# Patient Record
Sex: Female | Born: 1970 | Race: White | Hispanic: No | Marital: Married | Smoking: Never smoker
Health system: Southern US, Community
[De-identification: ages and names within clinical notes are randomized; demographics above are authoritative.]

## PROBLEM LIST (undated history)

## (undated) DIAGNOSIS — M1990 Unspecified osteoarthritis, unspecified site: Secondary | ICD-10-CM

## (undated) DIAGNOSIS — I10 Essential (primary) hypertension: Secondary | ICD-10-CM

## (undated) DIAGNOSIS — K219 Gastro-esophageal reflux disease without esophagitis: Secondary | ICD-10-CM

## (undated) DIAGNOSIS — J45909 Unspecified asthma, uncomplicated: Secondary | ICD-10-CM

## (undated) DIAGNOSIS — F988 Other specified behavioral and emotional disorders with onset usually occurring in childhood and adolescence: Secondary | ICD-10-CM

## (undated) DIAGNOSIS — Z87440 Personal history of urinary (tract) infections: Secondary | ICD-10-CM

## (undated) DIAGNOSIS — F3130 Bipolar disorder, current episode depressed, mild or moderate severity, unspecified: Secondary | ICD-10-CM

## (undated) HISTORY — DX: Bipolar disorder, current episode depressed, mild or moderate severity, unspecified: F31.30

## (undated) HISTORY — DX: Other specified behavioral and emotional disorders with onset usually occurring in childhood and adolescence: F98.8

## (undated) HISTORY — PX: CHOLECYSTECTOMY: SHX55

## (undated) HISTORY — PX: CARPAL TUNNEL RELEASE: SHX101

---

## 2007-02-16 ENCOUNTER — Ambulatory Visit (HOSPITAL_COMMUNITY): Admission: RE | Admit: 2007-02-16 | Discharge: 2007-02-17 | Payer: Self-pay | Admitting: Neurosurgery

## 2007-03-12 HISTORY — PX: CERVICAL DISC SURGERY: SHX588

## 2007-08-31 ENCOUNTER — Ambulatory Visit (HOSPITAL_COMMUNITY): Admission: RE | Admit: 2007-08-31 | Discharge: 2007-08-31 | Payer: Self-pay | Admitting: Neurosurgery

## 2007-12-10 ENCOUNTER — Encounter: Admission: RE | Admit: 2007-12-10 | Discharge: 2007-12-10 | Payer: Self-pay | Admitting: Neurosurgery

## 2008-03-11 HISTORY — PX: ABDOMINAL HYSTERECTOMY: SHX81

## 2008-09-02 ENCOUNTER — Encounter: Admission: RE | Admit: 2008-09-02 | Discharge: 2008-09-02 | Payer: Self-pay | Admitting: Neurosurgery

## 2009-11-16 ENCOUNTER — Encounter: Admission: RE | Admit: 2009-11-16 | Discharge: 2009-11-16 | Payer: Self-pay | Admitting: Neurosurgery

## 2010-07-24 NOTE — Op Note (Signed)
Anna, [name] NO.:  [id number]   MEDICAL RECORD NO.:  [id number]          PATIENT TYPE:  INP   LOCATION:  3172                         FACILITY:  [facility]   PHYSICIAN:  Donalee Citrin, M.D.        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   PROCEDURE:  Anterior cervical diskectomies and fusion at C4-5, C5-6, and  C6-7 using 7-mm cornerstone allograft wedges at C4-5 and C6-7, a 6-cm at  C5-6, and a 55-mm Danek Venture plating system with eight 13-mm screws.   SURGEON:  Donalee Citrin, M.D.   ASSISTANT:  Kathaleen Maser. Pool, M.D.   ANESTHESIA:  General endotracheal.   INDICATIONS FOR PROCEDURE:  The patient is a very pleasant 40 year old  female who has had longstanding neck pain and bilateral shoulder and arm  pain radiating down through her shoulder into her thumb and forefinger  predominantly of the right hand.  She has also had weakness and numbness  in the hands, with difficulty opening jars and buttoning a shirt, and  this has been progressing.  MRI scan showed a very large disk herniation  at C4-5 causing severe spinal cord compression, narrowing the canal to  just a few millimeters in width.  In addition, at C5-6 and C6-7, there  was also large disk herniations causing spinal cord compression and  biforaminal stenosis, worse on the right.  Due to the patient's MRI  findings, physical exam, and symptomatology, the patient was recommended  anterior cervical diskectomy and fusion.  The risks and benefits of the  operation were explained to the patient who understands and agreed to  proceed.   DESCRIPTION OF PROCEDURE:  The patient was brought to the OR and induced  under general anesthesia.  The patient was placed supine, and the neck  was  extended in 5 pounds of Halter traction.  The right side of the neck  was prepped and draped in the usual fashion.   Preoperative x-ray localized the appropriate level.  A curvilinear  incision was made just off the midline through the anterior portion of  the sternocleidomastoid  __________  The platysma was dissected out and  divided longitudinal.  The avascular plane between the  sternocleidomastoid and strap muscles was developed down to the  prevertebral fascia.  The prevertebral fascia was then dissected with  Kittner's.  Intraoperative x-ray confirmed localization and appropriate  level at C4-5.  Annulotomy was made with a 15 blade scalpel, and then  the long __________  laterally at all three levels.  At this point, the  __________ was placed.  Annulotomies were extended at all three levels,  and large anterior osteophytes were bitten off at C4, C5, and C6  vertebral bodies at all three levels with a 2 and 3-mm Kerrison punch.  Then using high-speed drill, all three disk spaces were drilled down to  the  posterior annulus and posterior osteophytic complexes, and at this  point, __________ first at C4-5.  The disk space was again drilled down  to the posterior annulus and using a 1-mm Kerrison punch, the posterior  annulus was removed in piecemeal fashion, exposing the posterior  longitudinal ligament.  There was noted to be marked disruption of the  PLL with large disk herniations that had migrated __________ compressing  the thecal sac.  These were teased away with a blunt nerve hook and  removed in piecemeal fashion and then aggressive underbiting of both the  C4 and the C5 endplates were achieved, decompressing the central canal.  Again, several large fragments of disk were removed compressing the  spinal cord, and located behind the C4 vertebral bodies __________  with  a blunt nerve hook and the endplate was further underbitten until  adequate decompression centrally was  achieved.  Both C5 pedicles were  palpated, and both C5 neural foramina were opened up.  After this,  Gelfoam was placed at this level, and attention was taken to then to C6-  7.  In a similar fashion, C6-7 was drilled down.  The posterior annulus  and the posterior longitudinal ligament was removed in piecemeal  fashion.  Again, further disk disruption and osteophytes __________  cause spinal cord compression predominantly to the right side.  This was  all underbitten and decompressed.  Both the C7 nerve root and C7  pedicles were palpated, and the endplate of C6 and C7 were aggressively  underbitten, decompressing the central canal, and both foramina were  widely patent at the end of the diskectomy.  Then lastly at C5-6 in a  similar fashion, large disk disruption was appreciated and causing  severe stenosis on the C6 nerve root on the right.  This was all removed  in piecemeal fashion.  The C6 nerve was skeletonized, and the foramen  was widely opened up.  Both endplates were aggressively underbitten,  decompressing the central canal, and the left C6 nerve root was also  decompressed.  At the end of the diskectomies, the endplates were  scraped.  A size 7 grafts were inserted at C4-5 and C6-7 and a 6-mm at  C5-6.  With all grafts in place, one 10-mm __________  .  Meticulous  hemostasis was maintained with Gelfoam and Surgifoam, and the wound was  copiously irrigated.  Then a Venture plate was placed.  All screws were  drilled and had excellent purchase.  The locking mechanism was engaged.  Fluoroscopy confirmed good position of the bone grafts, plates, and  screws.  Then again after adequate hemostasis had been maintained, the  wounds were closed in layers with interrupted Vicryl in the platysma and  a running 4-0 subcuticular.  Benzoin and Steri-Strips were applied.   The patient went to the recovery room in stable condition.  At the end  of the case, needle and sponge counts were  correct.           ______________________________  Donalee Citrin, M.D.     GC/MEDQ  D:  02/16/2007  T:  02/16/2007  Job:  [id number]

## 2010-07-24 NOTE — Op Note (Signed)
Anna [name], BELKNAP                 ACCOUNT NO.:  [id number]   MEDICAL RECORD NO.:  [id number]          PATIENT TYPE:  AMB   LOCATION:  SDS                          FACILITY:  [facility]   PHYSICIAN:  Donalee Citrin, M.D.        DATE OF BIRTH:  [birth year]   DATE OF PROCEDURE:  DATE OF DISCHARGE:                               OPERATIVE REPORT   PREOPERATIVE DIAGNOSIS:  Right carpal tunnel syndrome.   PROCEDURE:  Right carpal tunnel release.   HISTORY OF PRESENT ILLNESS:  The patient is a pleasant 40 year old  female who has had progressive worsening hand and wrist and hand pain  and numbness consistent with carpal tunnel syndrome for the last several  weeks and months.  EMG showed severe with median nerve entrapment of the  wrist.  Due to EMG findings and clinical exam consistent with carpal  tunnel syndrome, the patient is recommended right carpal tunnel release.  Risks and benefits of the operation were explained to the patient, and  he understands and agreed to proceed forward.   PROCEDURE IN DETAIL:  The patient was brought to the OR and was induced  under Bier block anesthesia on the right upper extremity plus  infiltration of 3 mL lidocaine with local adequately anesthetized the  right wrist.  An approximately 3 to 4-cm incision was drawn from the  distal crease of the wrist along the palmar crease towards the middle  finger and incised with  a 15-blade scalpel then a self-retaining  retractor was placed.  The transcarpal ligament was identified and  divided very carefully in layers until the median nerve epineurium was  visualized and using hemostat, the plane from undersurface of the carpal  ligament and the median nerve was developed.  The ligament was then  remarkably hypertrophied and this was incised with a 15-blade scalpel  using the hemostats to maintain plane between the ligament and the  nerve.  After adequate decompression and division of the transcarpal  ligament was  achieved both proximally distally and the hemostat easily  passed along the track of median the nerve, the wound was irrigated and  closed with interrupted vertical mattress nylon in the skin.  Wound was  then dressed and arm was wrapped.  The patient was sent to recovery room  in stable addition.  At the end of case, all sponge, needle, and  instrument counts were correct.           ______________________________  Donalee Citrin, M.D.     GC/MEDQ  D:  08/31/2007  T:  08/31/2007  Job:  [id number]

## 2010-12-06 LAB — CBC
HCT: 38.1
MCV: 90.6
Platelets: 327
RBC: 4.21
WBC: 8.5

## 2010-12-06 LAB — BASIC METABOLIC PANEL
CO2: 24
Calcium: 9.7
Creatinine, Ser: 0.73

## 2010-12-17 LAB — BASIC METABOLIC PANEL
BUN: 13
Chloride: 105
Creatinine, Ser: 1.01
Glucose, Bld: 103 — ABNORMAL HIGH
Potassium: 4
Sodium: 139

## 2010-12-17 LAB — CBC
HCT: 37.2
Platelets: 340
WBC: 8.7

## 2012-03-11 DIAGNOSIS — Z87440 Personal history of urinary (tract) infections: Secondary | ICD-10-CM

## 2012-03-11 HISTORY — DX: Personal history of urinary (tract) infections: Z87.440

## 2012-05-19 ENCOUNTER — Other Ambulatory Visit: Payer: Self-pay | Admitting: Neurosurgery

## 2012-07-06 ENCOUNTER — Encounter (HOSPITAL_COMMUNITY)
Admission: RE | Admit: 2012-07-06 | Discharge: 2012-07-06 | Disposition: A | Discharge status: Home / Self Care | Payer: Self-pay | Source: Ambulatory Visit | Attending: Neurosurgery | Admitting: Neurosurgery

## 2012-07-06 NOTE — Pre-Procedure Instructions (Signed)
Anna [name]  07/06/2012   Your procedure is scheduled on:  07/13/12  Report to [facility] at530 AM.  Call this number if you have problems the morning of surgery: [phone]   Remember:   Do not eat food or drink liquids after midnight.   Take these medicines the morning of surgery with A SIP OF WATER:   Do not wear jewelry, make-up or nail polish.  Do not wear lotions, powders, or perfumes. You may wear deodorant.  Do not shave 48 hours prior to surgery. Men may shave face and neck.  Do not bring valuables to the hospital.  Contacts, dentures or bridgework may not be worn into surgery.  Leave suitcase in the car. After surgery it may be brought to your room.  For patients admitted to the hospital, checkout time is 11:00 AM the day of  discharge.   Patients discharged the day of surgery will not be allowed to drive  home.  Name and phone number of your driver:   Special Instructions: Shower using CHG 2 nights before surgery and the night before surgery.  If you shower the day of surgery use CHG.  Use special wash - you have one bottle of CHG for all showers.  You should use approximately 1/3 of the bottle for each shower.   Please read over the following fact sheets that you were given: Pain Booklet, Coughing and Deep Breathing, MRSA Information and Surgical Site Infection Prevention

## 2012-07-07 ENCOUNTER — Encounter (HOSPITAL_COMMUNITY): Payer: Self-pay | Admitting: Pharmacy Technician

## 2012-07-08 ENCOUNTER — Inpatient Hospital Stay (HOSPITAL_COMMUNITY): Admission: RE | Admit: 2012-07-08 | Payer: Self-pay | Source: Ambulatory Visit

## 2012-07-10 ENCOUNTER — Encounter (HOSPITAL_COMMUNITY): Payer: Self-pay

## 2012-07-10 ENCOUNTER — Encounter (HOSPITAL_COMMUNITY)
Admission: RE | Admit: 2012-07-10 | Discharge: 2012-07-10 | Disposition: A | Discharge status: Home / Self Care | Payer: BC Managed Care – PPO | Source: Ambulatory Visit | Attending: Neurosurgery | Admitting: Neurosurgery

## 2012-07-10 HISTORY — DX: Unspecified asthma, uncomplicated: J45.909

## 2012-07-10 HISTORY — DX: Personal history of urinary (tract) infections: Z87.440

## 2012-07-10 HISTORY — DX: Unspecified osteoarthritis, unspecified site: M19.90

## 2012-07-10 HISTORY — DX: Gastro-esophageal reflux disease without esophagitis: K21.9

## 2012-07-10 HISTORY — DX: Essential (primary) hypertension: I10

## 2012-07-10 LAB — CBC
MCH: 30 pg (ref 26.0–34.0)
MCV: 84 fL (ref 78.0–100.0)
Platelets: 260 10*3/uL (ref 150–400)
RDW: 12.4 % (ref 11.5–15.5)
WBC: 10 10*3/uL (ref 4.0–10.5)

## 2012-07-10 LAB — SURGICAL PCR SCREEN: Staphylococcus aureus: NEGATIVE

## 2012-07-10 LAB — BASIC METABOLIC PANEL
CO2: 32 mEq/L (ref 19–32)
Calcium: 9.1 mg/dL (ref 8.4–10.5)
Creatinine, Ser: 1.02 mg/dL (ref 0.50–1.10)

## 2012-07-10 NOTE — Progress Notes (Signed)
ekg's reviewed by dr Chaney Malling. No orders

## 2012-07-10 NOTE — Pre-Procedure Instructions (Addendum)
Anna [name]  07/10/2012   Your procedure is scheduled on:  07/13/12  Report to [facility] at530 AM.  Call this number if you have problems the morning of surgery: [phone]   Remember:   Do not eat food or drink liquids after midnight.   Take these medicines the morning of surgery with A SIP OF WATER:xanax,  cymbalta,diflucan, pain med, valcyclovir   Do not wear jewelry, make-up or nail polish.  Do not wear lotions, powders, or perfumes. You may wear deodorant.  Do not shave 48 hours prior to surgery. Men may shave face and neck.  Do not bring valuables to the hospital.  Contacts, dentures or bridgework may not be worn into surgery.  Leave suitcase in the car. After surgery it may be brought to your room.  For patients admitted to the hospital, checkout time is 11:00 AM the day of  discharge.   Patients discharged the day of surgery will not be allowed to drive  home.  Name and phone number of your driver:   Special Instructions: Shower using CHG 2 nights before surgery and the night before surgery.  If you shower the day of surgery use CHG.  Use special wash - you have one bottle of CHG for all showers.  You should use approximately 1/3 of the bottle for each shower.   Please read over the following fact sheets that you were given: Pain Booklet, Coughing and Deep Breathing, MRSA Information and Surgical Site Infection Prevention

## 2012-07-10 NOTE — Progress Notes (Signed)
Please have anesthesia review labs; potassium is at a critical level.

## 2012-07-12 MED ORDER — DEXAMETHASONE SODIUM PHOSPHATE 10 MG/ML IJ SOLN
10.0000 mg | INTRAMUSCULAR | Status: AC
  Administered 2012-07-13: 10 mg via INTRAVENOUS
  Filled 2012-07-12: qty 1

## 2012-07-12 MED ORDER — CEFAZOLIN SODIUM-DEXTROSE 2-3 GM-% IV SOLR
2.0000 g | INTRAVENOUS | Status: AC
  Administered 2012-07-13: 2 g via INTRAVENOUS

## 2012-07-13 ENCOUNTER — Ambulatory Visit (HOSPITAL_COMMUNITY): Payer: BC Managed Care – PPO

## 2012-07-13 ENCOUNTER — Encounter (HOSPITAL_COMMUNITY)
Admission: RE | Disposition: A | Discharge status: Home / Self Care | Payer: Self-pay | Source: Ambulatory Visit | Attending: Neurosurgery

## 2012-07-13 ENCOUNTER — Ambulatory Visit (HOSPITAL_COMMUNITY): Payer: BC Managed Care – PPO | Admitting: Anesthesiology

## 2012-07-13 ENCOUNTER — Ambulatory Visit (HOSPITAL_COMMUNITY)
Admission: RE | Admit: 2012-07-13 | Discharge: 2012-07-13 | Disposition: A | Discharge status: Home / Self Care | Payer: BC Managed Care – PPO | Source: Ambulatory Visit | Attending: Neurosurgery | Admitting: Neurosurgery

## 2012-07-13 ENCOUNTER — Encounter (HOSPITAL_COMMUNITY): Payer: Self-pay | Admitting: Anesthesiology

## 2012-07-13 DIAGNOSIS — Z01810 Encounter for preprocedural cardiovascular examination: Secondary | ICD-10-CM | POA: Insufficient documentation

## 2012-07-13 DIAGNOSIS — I10 Essential (primary) hypertension: Secondary | ICD-10-CM | POA: Insufficient documentation

## 2012-07-13 DIAGNOSIS — Z981 Arthrodesis status: Secondary | ICD-10-CM | POA: Insufficient documentation

## 2012-07-13 DIAGNOSIS — M47812 Spondylosis without myelopathy or radiculopathy, cervical region: Secondary | ICD-10-CM | POA: Insufficient documentation

## 2012-07-13 DIAGNOSIS — Z01812 Encounter for preprocedural laboratory examination: Secondary | ICD-10-CM | POA: Insufficient documentation

## 2012-07-13 HISTORY — PX: ANTERIOR CERVICAL DECOMP/DISCECTOMY FUSION: SHX1161

## 2012-07-13 SURGERY — ANTERIOR CERVICAL DECOMPRESSION/DISCECTOMY FUSION 1 LEVEL
Anesthesia: General | Wound class: Clean

## 2012-07-13 MED ORDER — ACETAMINOPHEN 650 MG RE SUPP: 650.0000 mg | RECTAL | Status: DC | PRN

## 2012-07-13 MED ORDER — PHENOL 1.4 % MT LIQD: 1.0000 | OROMUCOSAL | Status: DC | PRN

## 2012-07-13 MED ORDER — LACTATED RINGERS IV SOLN
INTRAVENOUS | Status: DC | PRN
  Administered 2012-07-13 (×2): via INTRAVENOUS

## 2012-07-13 MED ORDER — HEMOSTATIC AGENTS (NO CHARGE) OPTIME
TOPICAL | Status: DC | PRN
  Administered 2012-07-13: 1 via TOPICAL

## 2012-07-13 MED ORDER — NEOSTIGMINE METHYLSULFATE 1 MG/ML IJ SOLN
INTRAMUSCULAR | Status: DC | PRN
  Administered 2012-07-13: 4 mg via INTRAVENOUS

## 2012-07-13 MED ORDER — ADULT MULTIVITAMIN W/MINERALS CH
1.0000 | ORAL_TABLET | Freq: Every day | ORAL | Status: DC
Start: 2012-07-13 — End: 2012-07-13
  Administered 2012-07-13: 1 via ORAL
  Filled 2012-07-13: qty 1

## 2012-07-13 MED ORDER — 0.9 % SODIUM CHLORIDE (POUR BTL) OPTIME
TOPICAL | Status: DC | PRN
  Administered 2012-07-13: 1000 mL

## 2012-07-13 MED ORDER — PHENYLEPHRINE HCL 10 MG/ML IJ SOLN
10.0000 mg | INTRAVENOUS | Status: DC | PRN
  Administered 2012-07-13: 20 ug/min via INTRAVENOUS

## 2012-07-13 MED ORDER — OXYCODONE-ACETAMINOPHEN 5-325 MG PO TABS
1.0000 | ORAL_TABLET | ORAL | Status: DC | PRN
  Administered 2012-07-13: 2 via ORAL
  Filled 2012-07-13: qty 2

## 2012-07-13 MED ORDER — LIDOCAINE HCL 4 % MT SOLN
OROMUCOSAL | Status: DC | PRN
  Administered 2012-07-13: 4 mL via TOPICAL

## 2012-07-13 MED ORDER — OLMESARTAN-AMLODIPINE-HCTZ 20-5-12.5 MG PO TABS: 1.0000 | ORAL_TABLET | Freq: Every day | ORAL | Status: DC

## 2012-07-13 MED ORDER — ACETAMINOPHEN 325 MG PO TABS: 650.0000 mg | ORAL_TABLET | ORAL | Status: DC | PRN

## 2012-07-13 MED ORDER — PHENYLEPHRINE HCL 10 MG/ML IJ SOLN
INTRAMUSCULAR | Status: DC | PRN
  Administered 2012-07-13 (×2): 120 ug via INTRAVENOUS

## 2012-07-13 MED ORDER — SODIUM CHLORIDE 0.9 % IV SOLN: 250.0000 mL | INTRAVENOUS | Status: DC

## 2012-07-13 MED ORDER — CYCLOBENZAPRINE HCL 10 MG PO TABS: 10.0000 mg | ORAL_TABLET | Freq: Three times a day (TID) | ORAL | Status: DC | PRN

## 2012-07-13 MED ORDER — MENTHOL 3 MG MT LOZG: 1.0000 | LOZENGE | OROMUCOSAL | Status: DC | PRN

## 2012-07-13 MED ORDER — HYDROMORPHONE HCL PF 1 MG/ML IJ SOLN: 0.2500 mg | INTRAMUSCULAR | Status: DC | PRN

## 2012-07-13 MED ORDER — MIDAZOLAM HCL 5 MG/5ML IJ SOLN
INTRAMUSCULAR | Status: DC | PRN
  Administered 2012-07-13 (×2): 1 mg via INTRAVENOUS

## 2012-07-13 MED ORDER — GLYCOPYRROLATE 0.2 MG/ML IJ SOLN
INTRAMUSCULAR | Status: DC | PRN
  Administered 2012-07-13: 0.6 mg via INTRAVENOUS

## 2012-07-13 MED ORDER — DULOXETINE HCL 60 MG PO CPEP
60.0000 mg | ORAL_CAPSULE | Freq: Two times a day (BID) | ORAL | Status: DC
  Administered 2012-07-13: 60 mg via ORAL
  Filled 2012-07-13 (×2): qty 1

## 2012-07-13 MED ORDER — THROMBIN 5000 UNITS EX SOLR
OROMUCOSAL | Status: DC | PRN
  Administered 2012-07-13: 09:00:00 via TOPICAL

## 2012-07-13 MED ORDER — IRBESARTAN 150 MG PO TABS: 150.0000 mg | ORAL_TABLET | Freq: Every day | ORAL | Status: DC

## 2012-07-13 MED ORDER — OXYCODONE HCL 5 MG PO TABS: 5.0000 mg | ORAL_TABLET | Freq: Once | ORAL | Status: DC | PRN

## 2012-07-13 MED ORDER — THROMBIN 5000 UNITS EX SOLR
CUTANEOUS | Status: DC | PRN
  Administered 2012-07-13 (×2): 5000 [IU] via TOPICAL

## 2012-07-13 MED ORDER — HYDROCHLOROTHIAZIDE 12.5 MG PO CAPS: 12.5000 mg | ORAL_CAPSULE | Freq: Every day | ORAL | Status: DC

## 2012-07-13 MED ORDER — SODIUM CHLORIDE 0.9 % IV SOLN
INTRAVENOUS | Status: AC
  Filled 2012-07-13: qty 500

## 2012-07-13 MED ORDER — AMLODIPINE BESYLATE 5 MG PO TABS: 5.0000 mg | ORAL_TABLET | Freq: Every day | ORAL | Status: DC

## 2012-07-13 MED ORDER — PROMETHAZINE HCL 25 MG PO TABS: 25.0000 mg | ORAL_TABLET | Freq: Three times a day (TID) | ORAL | Status: DC | PRN

## 2012-07-13 MED ORDER — AZELASTINE HCL 0.1 % NA SOLN
1.0000 | Freq: Two times a day (BID) | NASAL | Status: DC | PRN
  Filled 2012-07-13: qty 30

## 2012-07-13 MED ORDER — OXYCODONE HCL 5 MG/5ML PO SOLN: 5.0000 mg | Freq: Once | ORAL | Status: DC | PRN

## 2012-07-13 MED ORDER — ALPRAZOLAM 0.5 MG PO TABS: 0.5000 mg | ORAL_TABLET | Freq: Three times a day (TID) | ORAL | Status: DC | PRN

## 2012-07-13 MED ORDER — OXYCODONE HCL 5 MG PO TABS: ORAL_TABLET | ORAL | Status: DC

## 2012-07-13 MED ORDER — ACETAMINOPHEN 10 MG/ML IV SOLN
INTRAVENOUS | Status: AC
  Filled 2012-07-13: qty 100

## 2012-07-13 MED ORDER — LIDOCAINE HCL (CARDIAC) 20 MG/ML IV SOLN
INTRAVENOUS | Status: DC | PRN
  Administered 2012-07-13: 100 mg via INTRAVENOUS

## 2012-07-13 MED ORDER — MEPERIDINE HCL 25 MG/ML IJ SOLN: 6.2500 mg | INTRAMUSCULAR | Status: DC | PRN

## 2012-07-13 MED ORDER — SODIUM CHLORIDE 0.9 % IR SOLN
Status: DC | PRN
  Administered 2012-07-13: 08:00:00

## 2012-07-13 MED ORDER — FLUCONAZOLE 100 MG PO TABS: 100.0000 mg | ORAL_TABLET | Freq: Once | ORAL | Status: DC

## 2012-07-13 MED ORDER — ARTIFICIAL TEARS OP OINT
TOPICAL_OINTMENT | OPHTHALMIC | Status: DC | PRN
  Administered 2012-07-13: 1 via OPHTHALMIC

## 2012-07-13 MED ORDER — ONDANSETRON HCL 4 MG PO TABS: 4.0000 mg | ORAL_TABLET | Freq: Three times a day (TID) | ORAL | Status: DC | PRN

## 2012-07-13 MED ORDER — VALACYCLOVIR HCL 500 MG PO TABS
500.0000 mg | ORAL_TABLET | Freq: Two times a day (BID) | ORAL | Status: DC | PRN
Start: 2012-07-13 — End: 2012-07-13
  Filled 2012-07-13: qty 1

## 2012-07-13 MED ORDER — DOCUSATE SODIUM 100 MG PO CAPS: 100.0000 mg | ORAL_CAPSULE | Freq: Two times a day (BID) | ORAL | Status: DC

## 2012-07-13 MED ORDER — OXYCODONE HCL 10 MG PO TB12: 10.0000 mg | ORAL_TABLET | Freq: Two times a day (BID) | ORAL | Status: DC

## 2012-07-13 MED ORDER — PROPOFOL 10 MG/ML IV BOLUS
INTRAVENOUS | Status: DC | PRN
  Administered 2012-07-13: 120 mg via INTRAVENOUS

## 2012-07-13 MED ORDER — CEFAZOLIN SODIUM 1-5 GM-% IV SOLN
1.0000 g | Freq: Three times a day (TID) | INTRAVENOUS | Status: DC
  Administered 2012-07-13: 1 g via INTRAVENOUS
  Filled 2012-07-13 (×2): qty 50

## 2012-07-13 MED ORDER — BACITRACIN 50000 UNITS IM SOLR
INTRAMUSCULAR | Status: AC
  Filled 2012-07-13: qty 1

## 2012-07-13 MED ORDER — ONDANSETRON HCL 4 MG/2ML IJ SOLN: 4.0000 mg | Freq: Once | INTRAMUSCULAR | Status: DC | PRN

## 2012-07-13 MED ORDER — OXYCODONE HCL ER 10 MG PO T12A: 10.0000 mg | EXTENDED_RELEASE_TABLET | Freq: Two times a day (BID) | ORAL | Status: DC

## 2012-07-13 MED ORDER — ONDANSETRON HCL 4 MG/2ML IJ SOLN
INTRAMUSCULAR | Status: DC | PRN
  Administered 2012-07-13: 4 mg via INTRAVENOUS

## 2012-07-13 MED ORDER — ROCURONIUM BROMIDE 100 MG/10ML IV SOLN
INTRAVENOUS | Status: DC | PRN
  Administered 2012-07-13: 50 mg via INTRAVENOUS

## 2012-07-13 MED ORDER — ACETAMINOPHEN 10 MG/ML IV SOLN
1000.0000 mg | Freq: Once | INTRAVENOUS | Status: AC
  Administered 2012-07-13: 1000 mg via INTRAVENOUS

## 2012-07-13 MED ORDER — SODIUM CHLORIDE 0.9 % IJ SOLN: 3.0000 mL | INTRAMUSCULAR | Status: DC | PRN

## 2012-07-13 MED ORDER — ONDANSETRON HCL 4 MG/2ML IJ SOLN: 4.0000 mg | INTRAMUSCULAR | Status: DC | PRN

## 2012-07-13 MED ORDER — HYDROMORPHONE HCL PF 1 MG/ML IJ SOLN: 0.5000 mg | INTRAMUSCULAR | Status: DC | PRN

## 2012-07-13 MED ORDER — SODIUM CHLORIDE 0.9 % IJ SOLN: 3.0000 mL | Freq: Two times a day (BID) | INTRAMUSCULAR | Status: DC

## 2012-07-13 MED ORDER — FENTANYL CITRATE 0.05 MG/ML IJ SOLN
INTRAMUSCULAR | Status: DC | PRN
  Administered 2012-07-13: 100 ug via INTRAVENOUS
  Administered 2012-07-13: 150 ug via INTRAVENOUS
  Administered 2012-07-13: 100 ug via INTRAVENOUS
  Administered 2012-07-13: 50 ug via INTRAVENOUS

## 2012-07-13 SURGICAL SUPPLY — 58 items
16 MM DRILL BIT ×2 IMPLANT
BAG DECANTER FOR FLEXI CONT (MISCELLANEOUS) ×2 IMPLANT
BENZOIN TINCTURE PRP APPL 2/3 (GAUZE/BANDAGES/DRESSINGS) ×2 IMPLANT
BRUSH SCRUB EZ PLAIN DRY (MISCELLANEOUS) ×2 IMPLANT
BUR MATCHSTICK NEURO 3.0 LAGG (BURR) ×2 IMPLANT
CANISTER SUCTION 2500CC (MISCELLANEOUS) ×2 IMPLANT
CLOTH BEACON ORANGE TIMEOUT ST (SAFETY) ×2 IMPLANT
CONT SPEC 4OZ CLIKSEAL STRL BL (MISCELLANEOUS) ×2 IMPLANT
DERMABOND ADVANCED (GAUZE/BANDAGES/DRESSINGS) ×2
DERMABOND ADVANCED .7 DNX12 (GAUZE/BANDAGES/DRESSINGS) ×2 IMPLANT
DRAPE C-ARM 42X72 X-RAY (DRAPES) ×4 IMPLANT
DRAPE LAPAROTOMY 100X72 PEDS (DRAPES) ×2 IMPLANT
DRAPE MICROSCOPE LEICA (MISCELLANEOUS) ×2 IMPLANT
DRAPE MICROSCOPE ZEISS OPMI (DRAPES) IMPLANT
DRAPE POUCH INSTRU U-SHP 10X18 (DRAPES) ×2 IMPLANT
DRESSING TELFA 8X3 (GAUZE/BANDAGES/DRESSINGS) ×2 IMPLANT
DRILL BIT STRAIGHT 16MM (BIT) ×2 IMPLANT
DRSG OPSITE 4X5.5 SM (GAUZE/BANDAGES/DRESSINGS) ×2 IMPLANT
DURAPREP 6ML APPLICATOR 50/CS (WOUND CARE) ×2 IMPLANT
ELECT COATED BLADE 2.86 ST (ELECTRODE) ×2 IMPLANT
ELECT REM PT RETURN 9FT ADLT (ELECTROSURGICAL) ×2
ELECTRODE REM PT RTRN 9FT ADLT (ELECTROSURGICAL) ×1 IMPLANT
GAUZE SPONGE 4X4 16PLY XRAY LF (GAUZE/BANDAGES/DRESSINGS) IMPLANT
GLOVE BIO SURGEON STRL SZ8 (GLOVE) ×4 IMPLANT
GLOVE ECLIPSE 8.5 STRL (GLOVE) ×2 IMPLANT
GLOVE EXAM NITRILE LRG STRL (GLOVE) IMPLANT
GLOVE EXAM NITRILE MD LF STRL (GLOVE) ×2 IMPLANT
GLOVE EXAM NITRILE XL STR (GLOVE) IMPLANT
GLOVE EXAM NITRILE XS STR PU (GLOVE) IMPLANT
GLOVE INDICATOR 8.5 STRL (GLOVE) ×4 IMPLANT
GOWN BRE IMP SLV AUR LG STRL (GOWN DISPOSABLE) IMPLANT
GOWN BRE IMP SLV AUR XL STRL (GOWN DISPOSABLE) ×8 IMPLANT
GOWN STRL REIN 2XL LVL4 (GOWN DISPOSABLE) ×2 IMPLANT
HEAD HALTER (SOFTGOODS) ×2 IMPLANT
HEMOSTAT POWDER KIT SURGIFOAM (HEMOSTASIS) ×2 IMPLANT
KIT BASIN OR (CUSTOM PROCEDURE TRAY) ×2 IMPLANT
KIT ROOM TURNOVER OR (KITS) ×2 IMPLANT
NEEDLE HYPO 18GX1.5 BLUNT FILL (NEEDLE) ×2 IMPLANT
NEEDLE SPNL 20GX3.5 QUINCKE YW (NEEDLE) ×2 IMPLANT
NS IRRIG 1000ML POUR BTL (IV SOLUTION) ×2 IMPLANT
PACK LAMINECTOMY NEURO (CUSTOM PROCEDURE TRAY) ×2 IMPLANT
PAD ARMBOARD 7.5X6 YLW CONV (MISCELLANEOUS) ×4 IMPLANT
PUTTY BONE DBX 2.5 MIS (Bone Implant) ×2 IMPLANT
RUBBERBAND STERILE (MISCELLANEOUS) ×4 IMPLANT
SCREW BONE SELFTAP VAR 16MM (Screw) ×4 IMPLANT
SPACER COALITION 12X14-0 7MM (Spacer) ×2 IMPLANT
SPONGE GAUZE 4X4 12PLY (GAUZE/BANDAGES/DRESSINGS) ×2 IMPLANT
SPONGE INTESTINAL PEANUT (DISPOSABLE) ×2 IMPLANT
SPONGE SURGIFOAM ABS GEL SZ50 (HEMOSTASIS) ×2 IMPLANT
STRIP CLOSURE SKIN 1/2X4 (GAUZE/BANDAGES/DRESSINGS) ×2 IMPLANT
SUT VIC AB 3-0 SH 8-18 (SUTURE) ×2 IMPLANT
SUT VICRYL 4-0 PS2 18IN ABS (SUTURE) ×2 IMPLANT
SYR 20ML ECCENTRIC (SYRINGE) ×2 IMPLANT
TAPE CLOTH 4X10 WHT NS (GAUZE/BANDAGES/DRESSINGS) IMPLANT
TOWEL OR 17X24 6PK STRL BLUE (TOWEL DISPOSABLE) ×2 IMPLANT
TOWEL OR 17X26 10 PK STRL BLUE (TOWEL DISPOSABLE) ×2 IMPLANT
TRAP SPECIMEN MUCOUS 40CC (MISCELLANEOUS) ×2 IMPLANT
WATER STERILE IRR 1000ML POUR (IV SOLUTION) ×2 IMPLANT

## 2012-07-13 NOTE — Anesthesia Postprocedure Evaluation (Signed)
Anesthesia Post Note  Patient: Anna [name]  Procedure(s) Performed: Procedure(s) (LRB): ANTERIOR CERVICAL DECOMPRESSION/DISCECTOMY FUSION 1 LEVEL (N/A)  Anesthesia type: general  Patient location: PACU  Post pain: Pain level controlled  Post assessment: Patient's Cardiovascular Status Stable  Last Vitals:  Filed Vitals:   07/13/12 0922  BP:   Pulse:   Temp: 36.6 C  Resp:     Post vital signs: Reviewed and stable  Level of consciousness: sedated  Complications: No apparent anesthesia complications

## 2012-07-13 NOTE — Anesthesia Preprocedure Evaluation (Signed)
Anesthesia Evaluation  Patient identified by MRN, date of birth, ID band Patient awake    Reviewed: Allergy & Precautions, H&P , NPO status , Patient's Chart, lab work & pertinent test results  Airway Mallampati: I TM Distance: >3 FB Neck ROM: Full    Dental   Pulmonary          Cardiovascular hypertension, Pt. on medications     Neuro/Psych    GI/Hepatic GERD-  Medicated and Controlled,  Endo/Other    Renal/GU      Musculoskeletal   Abdominal   Peds  Hematology   Anesthesia Other Findings   Reproductive/Obstetrics                           Anesthesia Physical Anesthesia Plan  ASA: II  Anesthesia Plan: General   Post-op Pain Management:    Induction: Intravenous  Airway Management Planned: Oral ETT  Additional Equipment:   Intra-op Plan:   Post-operative Plan: Extubation in OR  Informed Consent: I have reviewed the patients History and Physical, chart, labs and discussed the procedure including the risks, benefits and alternatives for the proposed anesthesia with the patient or authorized representative who has indicated his/her understanding and acceptance.     Plan Discussed with: CRNA and Surgeon  Anesthesia Plan Comments:         Anesthesia Quick Evaluation  

## 2012-07-13 NOTE — Anesthesia Procedure Notes (Signed)
Procedure Name: Intubation Date/Time: 07/13/2012 7:34 AM Performed by: Gayla Medicus Pre-anesthesia Checklist: Timeout performed, Patient identified, Emergency Drugs available, Suction available and Patient being monitored Patient Re-evaluated:Patient Re-evaluated prior to inductionOxygen Delivery Method: Circle system utilized Preoxygenation: Pre-oxygenation with 100% oxygen Intubation Type: IV induction Ventilation: Mask ventilation without difficulty Laryngoscope Size: Mac and 3 Grade View: Grade I Tube type: Oral Tube size: 7.0 mm Number of attempts: 1 Airway Equipment and Method: Stylet and LTA kit utilized Placement Confirmation: positive ETCO2,  ETT inserted through vocal cords under direct vision and breath sounds checked- equal and bilateral Secured at: 21 cm Tube secured with: Tape Dental Injury: Teeth and Oropharynx as per pre-operative assessment

## 2012-07-13 NOTE — Transfer of Care (Signed)
Immediate Anesthesia Transfer of Care Note  Patient: Anna [name]  Procedure(s) Performed: Procedure(s) with comments: ANTERIOR CERVICAL DECOMPRESSION/DISCECTOMY FUSION 1 LEVEL (N/A) - Anterior Cervical Decompression/Discectomy Cervical Three-Four  Patient Location: PACU  Anesthesia Type:General  Level of Consciousness: awake, alert  and oriented  Airway & Oxygen Therapy: Patient Spontanous Breathing and Patient connected to nasal cannula oxygen  Post-op Assessment: Report given to PACU RN, Post -op Vital signs reviewed and stable and Patient moving all extremities X 4  Post vital signs: Reviewed and stable  Complications: No apparent anesthesia complications

## 2012-07-13 NOTE — Preoperative (Signed)
Beta Blockers   Reason not to administer Beta Blockers:Not Applicable 

## 2012-07-13 NOTE — H&P (Signed)
Anna [name] is an 42 y.o. female.   Chief Complaint: Neck and bilateral shoulder pain HPI: Patient is a very pleasant 42 year old same as her previous C4-C7 anterior cervical discectomies and fusion. She did very well however last several months is a progress worsening neck pain with into both shoulders usually correlates with where she wears her laptop but the pain does begin neck rate and the tops of both shoulders workup with myelopathy CT scans MRI scans and plain films have shown progressive degeneration around C3-4 of of the previous C4-C7 fusion with central and slightly rightward spur moderate spinal stenosis. In addition it also showed her previous fusion was solid at C4-5 C5-6 and mostly C6-7. She failed all forms of conservative treatment with anti-inflammatories physical therapy narcotic pain management steroids. Due to her progression of clinical syndrome imaging findings and failure of conservative treatment I recommended enter cervicectomy fusion C3-4 I recommended utilizing one of the integrated cages if we can if there is enough room to anchor it above the previous plate to prevent removal of the plate both from the surgical dissection perspective as well as because she has about a 60-70% of the graft infused at C6-7 but not 100%, I felt it was better to leave the plate behind if possible.  Past Medical History  Diagnosis Date  . Hypertension   . Asthma     seasonal  . History of recurrent UTIs 1/14  . GERD (gastroesophageal reflux disease)     occ  . Arthritis     Past Surgical History  Procedure Laterality Date  . Abdominal hysterectomy    . Cholecystectomy    . Cervical disc surgery  09  . Carpal tunnel release Right     No family history on file. Social History:  reports that she has never smoked. She does not have any smokeless tobacco history on file. She reports that she does not drink alcohol or use illicit drugs.  Allergies:  Allergies  Allergen Reactions  .  Avelox (Moxifloxacin Hcl In Nacl) Anaphylaxis  . Lactose Intolerance (Gi) Diarrhea  . Tape Other (See Comments)    If tape on a while will pull off skin    Medications Prior to Admission  Medication Sig Dispense Refill  . ALPRAZolam (XANAX) 0.5 MG tablet Take 0.5 mg by mouth 3 (three) times daily as needed for sleep or anxiety.      Marland Kitchen azelastine (ASTELIN) 137 MCG/SPRAY nasal spray Place 1 spray into the nose 2 (two) times daily as needed for rhinitis. Use in each nostril as directed      . Brompheniramine-Phenylephrine (COLD & ALLERGY PO) Take 1 tablet by mouth every 6 (six) hours as needed (allergy/cold symptoms).      . DULoxetine (CYMBALTA) 60 MG capsule Take 60 mg by mouth 2 (two) times daily.      . fluconazole (DIFLUCAN) 100 MG tablet Take 100 mg by mouth once.      . Multiple Vitamin (MULTIVITAMIN WITH MINERALS) TABS Take 1 tablet by mouth daily.      . Olmesartan-Amlodipine-HCTZ (TRIBENZOR) 20-5-12.5 MG TABS Take 1 tablet by mouth daily.      . ondansetron (ZOFRAN) 4 MG tablet Take 4 mg by mouth every 8 (eight) hours as needed for nausea.      Marland Kitchen oxyCODONE (OXYCONTIN) 10 MG 12 hr tablet Take 10-20 mg by mouth every 4 (four) hours as needed for pain.      . promethazine (PHENERGAN) 25 MG tablet Take 25  mg by mouth every 8 (eight) hours as needed for nausea.      . ciprofloxacin (CIPRO) 250 MG tablet Take 250 mg by mouth 2 (two) times daily.      . valACYclovir (VALTREX) 500 MG tablet Take 500 mg by mouth 2 (two) times daily as needed (flare up).        Results for orders placed during the hospital encounter of 07/13/12 (from the past 48 hour(s))  POTASSIUM     Status: Abnormal   Collection Time    07/13/12  6:06 AM      Result Value Range   Potassium 3.2 (*) 3.5 - 5.1 mEq/L   Dg Chest 2 View  07/13/2012  *RADIOLOGY REPORT*  Clinical Data: 42 year old female preoperative study for neck surgery.  CHEST - 2 VIEW  Comparison: CT abdomen 07/06/2012.  Chest radiographs 05/03/2005.   Findings: Cervical ACDF hardware.  Low normal lung volumes. Cardiac size and mediastinal contours are within normal limits. Visualized tracheal air column is within normal limits.  No pneumothorax, pulmonary edema, pleural effusion or confluent pulmonary opacity. No acute osseous abnormality identified. Right upper quadrant surgical clips.  IMPRESSION: No acute cardiopulmonary abnormality.   Original Report Authenticated By: Erskine Speed, M.D.     Review of Systems  Constitutional: Negative.   HENT: Positive for neck pain.   Eyes: Negative.   Respiratory: Negative.   Cardiovascular: Negative.   Gastrointestinal: Negative.   Genitourinary: Negative.   Musculoskeletal: Positive for myalgias and joint pain.  Skin: Negative.   Neurological: Positive for tingling, sensory change and headaches.  Endo/Heme/Allergies: Negative.   Psychiatric/Behavioral: Negative.     Blood pressure 108/77, pulse 76, temperature 98.5 F (36.9 C), temperature source Oral, resp. rate 16, SpO2 100.00%. Physical Exam  Constitutional: She is oriented to person, place, and time. She appears well-developed and well-nourished.  Eyes: Pupils are equal, round, and reactive to light.  Neck: Normal range of motion.  Respiratory: Effort normal.  GI: Soft.  Musculoskeletal: Normal range of motion.  Neurological: She is alert and oriented to person, place, and time. She has normal strength. GCS eye subscore is 4. GCS verbal subscore is 5. GCS motor subscore is 6.  Reflex Scores:      Tricep reflexes are 1+ on the right side and 1+ on the left side.      Bicep reflexes are 1+ on the right side and 1+ on the left side.      Brachioradialis reflexes are 1+ on the right side and 1+ on the left side.      Patellar reflexes are 2+ on the right side and 2+ on the left side.      Achilles reflexes are 2+ on the right side and 2+ on the left side. Patient has 5 out of 5 strength in her deltoids, biceps, triceps, wrist flexion, wrist  extension, and intrinsics. Lower extremity strength is also 5 out of 5     Assessment/Plan 42 year old female presents for an ACDF at C3-4  Constantin Hillery P 07/13/2012, 7:06 AM

## 2012-07-13 NOTE — Op Note (Signed)
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  EBL: Minimal  History of present illness: Patient is a very pleasant 42 year old female who has had progressive worsening neck and bilateral shoulder pain worse in the webspace of her right shoulder is refractory to all forms of conservative treatment and progression of the last couple years. Workup has revealed progressive spondylosis with stenosis centrally at C3-4 and due to her progression of clinical syndrome and imaging findings and failure conservative treatment I recommended anterior cervical discectomy and fusion with integrated peek cage extensor with a wrist nevus of the operation with her as well as perioperative course expectations of outcome alternatives surgery she understands and agrees to proceed forward.  Operative procedure: Patient was brought into the or was induced under general anesthesia positioned supine the neck in slight extension in 5 pounds of halter traction the right 7 and was prepped and draped in routine sterile fashion. Preoperative localizing the appropriate level circumlinear incision was made just up midline to the interbody the sternomastoid and the superficial layer of the platysmas dissected and divided longitudinally the avascular plane to sternomastoid and strap as was developed down through the fascia prevertebral fascia facet with Kitners. The old plate was immediate identified the supra-acetabular was dissected free and the 34 disc spaces identified the longus Cole as reflected laterally and self-retaining retractor was placed. Anterior margin of the spaces and cleanout anterolisthesis of about a 3 minute Kerrison punch disc space is then scraped with a BA curette removing the disc  with a pituitary rongeur and then the disc space is drilled down the posterior aspect capturing the bone shavings in a mucous trap. Then under microscopic illumination aggressive abutting both endplates allowed identification of the PLL which was removed in piecemeal fashion exposing the thecal sac. Large posterior aspect coming off of the C3 and C4 vertebral bodies centrally were aggressively under been decompress the central canal March across laterally the C4 pedicles were identified and C4 nerve roots were also decompressed. The endplates were then scraped appears to have bone grafting. Deformation was removed Ross the a peek cage was sized up and a 7 mm coalition peek cages packed with local autograft mixed DBX and inserted under fluoroscopy the 16 mm hand drill bit was then used to drill the hole then 216 mm screws are placed centrally in the C3 and C4 vertebral bodies respectively. Fluoroscopy confirmed the step along the way to positioning of the implant. The wounds and copious irrigated meticulous in space was maintained additional bone graft was packed laterally to the cage band was closed in layers with after Vicryl the skin was) 4 subcuticular benzo and Steri-Strips were applied patient recovered in stable condition. At the case on it counts sponge counts were correct.

## 2012-07-13 NOTE — Discharge Summary (Signed)
  Physician Discharge Summary  Patient ID: [name] MRN: [id number] DOB/AGE: 1970/09/16 42 y.o.  Admit date: 07/13/2012 Discharge date: 07/13/2012  Admission Diagnoses: Cervical spondylosis with stenosis C3-4  Discharge Diagnoses: Same Active Problems:   * No active hospital problems. *   Discharged Condition: good  Hospital Course: Patient was admitted hospital underwent an ACDF at C3-4 postop limits did very well went to the floor she stable for discharge home that afternoon was scheduled followup in one to 2 weeks.   Consults: Significant Diagnostic Studies: Treatments: ACDF C3-4 Discharge Exam: Blood pressure 119/82, pulse 81, temperature 98.6 F (37 C), temperature source Oral, resp. rate 16, SpO2 93.00%. Strength out of 5 wound clean and  Disposition: Home     Medication List    STOP taking these medications       oxycodone 5 MG capsule  Commonly known as:  OXY-IR      TAKE these medications       ALPRAZolam 0.5 MG tablet  Commonly known as:  XANAX  Take 0.5 mg by mouth 3 (three) times daily as needed for sleep or anxiety.     azelastine 137 MCG/SPRAY nasal spray  Commonly known as:  ASTELIN  Place 1 spray into the nose 2 (two) times daily as needed for rhinitis. Use in each nostril as directed     ciprofloxacin 250 MG tablet  Commonly known as:  CIPRO  Take 250 mg by mouth 2 (two) times daily.     COLD & ALLERGY PO  Take 1 tablet by mouth every 6 (six) hours as needed (allergy/cold symptoms).     cyclobenzaprine 10 MG tablet  Commonly known as:  FLEXERIL  Take 1 tablet (10 mg total) by mouth 3 (three) times daily as needed for muscle spasms.     DULoxetine 60 MG capsule  Commonly known as:  CYMBALTA  Take 60 mg by mouth 2 (two) times daily.     fluconazole 100 MG tablet  Commonly known as:  DIFLUCAN  Take 100 mg by mouth once.     multivitamin with minerals Tabs  Take 1 tablet by mouth daily.     ondansetron 4 MG tablet  Commonly known as:   ZOFRAN  Take 4 mg by mouth every 8 (eight) hours as needed for nausea.     oxyCODONE 5 MG immediate release tablet  Commonly known as:  ROXICODONE  15mg  take 1-2 po Q4h prn     promethazine 25 MG tablet  Commonly known as:  PHENERGAN  Take 25 mg by mouth every 8 (eight) hours as needed for nausea.     TRIBENZOR 20-5-12.5 MG Tabs  Generic drug:  Olmesartan-Amlodipine-HCTZ  Take 1 tablet by mouth daily.     valACYclovir 500 MG tablet  Commonly known as:  VALTREX  Take 500 mg by mouth 2 (two) times daily as needed (flare up).           Follow-up Information   Follow up with Townes Fuhs P, MD In 1 week.   Contact information:   [address] [place] [address] [phone]       Signed: Quinnetta Roepke P 07/13/2012, 4:33 PM

## 2012-07-13 NOTE — Progress Notes (Signed)
Pt doing well. Pt and husband given D/C instructions with Rx's, verbal understanding given. Pt D/C'd home via wheelchair @ 1515 per MD order. Rema Fendt, RN

## 2012-07-21 ENCOUNTER — Encounter (HOSPITAL_COMMUNITY): Payer: Self-pay | Admitting: Neurosurgery

## 2012-08-15 DIAGNOSIS — E669 Obesity, unspecified: Secondary | ICD-10-CM | POA: Insufficient documentation

## 2012-08-15 DIAGNOSIS — F4310 Post-traumatic stress disorder, unspecified: Secondary | ICD-10-CM | POA: Insufficient documentation

## 2012-08-15 DIAGNOSIS — F4542 Pain disorder with related psychological factors: Secondary | ICD-10-CM | POA: Insufficient documentation

## 2012-12-14 ENCOUNTER — Encounter: Payer: Self-pay | Admitting: Endocrinology

## 2012-12-14 ENCOUNTER — Ambulatory Visit (INDEPENDENT_AMBULATORY_CARE_PROVIDER_SITE_OTHER): Payer: BC Managed Care – PPO | Admitting: Endocrinology

## 2012-12-14 VITALS — BP 126/78 | HR 112 | Ht 64.0 in | Wt 164.0 lb

## 2012-12-14 DIAGNOSIS — I10 Essential (primary) hypertension: Secondary | ICD-10-CM

## 2012-12-14 DIAGNOSIS — Z23 Encounter for immunization: Secondary | ICD-10-CM

## 2012-12-14 DIAGNOSIS — J309 Allergic rhinitis, unspecified: Secondary | ICD-10-CM

## 2012-12-14 DIAGNOSIS — N318 Other neuromuscular dysfunction of bladder: Secondary | ICD-10-CM

## 2012-12-14 DIAGNOSIS — E0590 Thyrotoxicosis, unspecified without thyrotoxic crisis or storm: Secondary | ICD-10-CM

## 2012-12-14 DIAGNOSIS — K219 Gastro-esophageal reflux disease without esophagitis: Secondary | ICD-10-CM

## 2012-12-14 DIAGNOSIS — M5137 Other intervertebral disc degeneration, lumbosacral region: Secondary | ICD-10-CM

## 2012-12-14 NOTE — Progress Notes (Signed)
Subjective:    Patient ID: Anna [name], female    DOB: [birth year], 42 y.o.   MRN: [id number]  HPI In may of 2014, pt says she had c-spine surgery.  Since then, she has mild hair loss throughout the head, and assoc fatigue.   Past Medical History  Diagnosis Date  . Hypertension   . Asthma     seasonal  . History of recurrent UTIs 1/14  . GERD (gastroesophageal reflux disease)     occ  . Arthritis    Past Surgical History  Procedure Laterality Date  . Abdominal hysterectomy    . Cholecystectomy    . Cervical disc surgery  09  . Carpal tunnel release Right   . Anterior cervical decomp/discectomy fusion N/A 07/13/2012    Procedure: ANTERIOR CERVICAL DECOMPRESSION/DISCECTOMY FUSION 1 LEVEL;  Surgeon: Mariam Dollar, MD;  Location: [facility];  Service: Neurosurgery;  Laterality: N/A;  Anterior Cervical Decompression/Discectomy Cervical Three-Four   History   Social History  . Marital Status: Married    Spouse Name: N/A    Number of Children: N/A  . Years of Education: N/A   Occupational History  . Not on file.   Social History Main Topics  . Smoking status: Never Smoker   . Smokeless tobacco: Not on file  . Alcohol Use: No  . Drug Use: No  . Sexual Activity: Not on file   Other Topics Concern  . Not on file   Social History Narrative  . No narrative on file    Current Outpatient Prescriptions on File Prior to Visit  Medication Sig Dispense Refill  . ALPRAZolam (XANAX) 0.5 MG tablet Take 0.5 mg by mouth 3 (three) times daily as needed for sleep or anxiety.      Marland Kitchen azelastine (ASTELIN) 137 MCG/SPRAY nasal spray Place 1 spray into the nose 2 (two) times daily as needed for rhinitis. Use in each nostril as directed      . Brompheniramine-Phenylephrine (COLD & ALLERGY PO) Take 1 tablet by mouth every 6 (six) hours as needed (allergy/cold symptoms).      . cyclobenzaprine (FLEXERIL) 10 MG tablet Take 1 tablet (10 mg total) by mouth 3 (three) times daily as needed for muscle  spasms.  80 tablet  1  . DULoxetine (CYMBALTA) 60 MG capsule Take 60 mg by mouth 2 (two) times daily.      . fluconazole (DIFLUCAN) 100 MG tablet Take 100 mg by mouth once.      . Multiple Vitamin (MULTIVITAMIN WITH MINERALS) TABS Take 1 tablet by mouth daily.      . Olmesartan-Amlodipine-HCTZ (TRIBENZOR) 20-5-12.5 MG TABS Take 1 tablet by mouth daily.      . ondansetron (ZOFRAN) 4 MG tablet Take 4 mg by mouth every 8 (eight) hours as needed for nausea.      Marland Kitchen oxyCODONE (ROXICODONE) 5 MG immediate release tablet 15mg  take 1-2 po Q4h prn  80 tablet  0  . promethazine (PHENERGAN) 25 MG tablet Take 25 mg by mouth every 8 (eight) hours as needed for nausea.      . valACYclovir (VALTREX) 500 MG tablet Take 500 mg by mouth 2 (two) times daily as needed (flare up).      . ciprofloxacin (CIPRO) 250 MG tablet Take 250 mg by mouth 2 (two) times daily.       No current facility-administered medications on file prior to visit.    Allergies  Allergen Reactions  . Avelox [Moxifloxacin Hcl In Nacl] Anaphylaxis  .  Lactose Intolerance (Gi) Diarrhea  . Tape Other (See Comments)    If tape on a while will pull off skin    No family history on file. Hypothyroidism: mother. BP 126/78  Pulse 112  Ht 5\' 4"  (1.626 m)  Wt 164 lb (74.39 kg)  BMI 28.14 kg/m2  SpO2 97%  Review of Systems She reports mood swings, depression, difficulty with concentration, constipation, allergy sxs, easy bruising, excessive diaphoresis, and dry skin.  she has lost a few lbs.  She has no menses, due to TAH.   denies cramps, sob, fever, numbness, blurry vision, myalgias, and syncope.       Objective:   Physical Exam VS: see vs page GEN: no distress HEAD: head: no deformity eyes: no periorbital swelling, no proptosis external nose and ears are normal mouth: no lesion seen NECK: supple, thyroid is not enlarged CHEST WALL: no deformity LUNGS:  Clear to auscultation CV: reg rate and rhythm, no murmur ABD: abdomen is  soft, nontender.  no hepatosplenomegaly.  not distended.  no hernia MUSCULOSKELETAL: muscle bulk and strength are grossly normal.  no obvious joint swelling.  gait is normal and steady EXTEMITIES: no deformity.  no ulcer on the feet.  feet are of normal color and temp.  no edema PULSES: dorsalis pedis intact bilat.  no carotid bruit NEURO:  cn 2-12 grossly intact.   readily moves all 4's.  sensation is intact to touch on the feet SKIN:  Normal texture and temperature.  No rash or suspicious lesion is visible.   NODES:  None palpable at the neck PSYCH: alert, oriented x3.  Does not appear anxious nor depressed.   outside test results are reviewed: TSH=0.2    Assessment & Plan:  Hyperthyroidism, mild, new, uncertain etiology Hair loss: very unlikely to be thyroid-related Depression: very unlikely to be thyroid-related

## 2012-12-14 NOTE — Patient Instructions (Addendum)
i'll review the blood test results, and let you know.

## 2012-12-15 ENCOUNTER — Telehealth: Payer: Self-pay | Admitting: Endocrinology

## 2012-12-15 DIAGNOSIS — I10 Essential (primary) hypertension: Secondary | ICD-10-CM | POA: Insufficient documentation

## 2012-12-15 DIAGNOSIS — J309 Allergic rhinitis, unspecified: Secondary | ICD-10-CM | POA: Insufficient documentation

## 2012-12-15 DIAGNOSIS — M5137 Other intervertebral disc degeneration, lumbosacral region: Secondary | ICD-10-CM | POA: Insufficient documentation

## 2012-12-15 DIAGNOSIS — K219 Gastro-esophageal reflux disease without esophagitis: Secondary | ICD-10-CM | POA: Insufficient documentation

## 2012-12-15 DIAGNOSIS — M51379 Other intervertebral disc degeneration, lumbosacral region without mention of lumbar back pain or lower extremity pain: Secondary | ICD-10-CM | POA: Insufficient documentation

## 2012-12-15 DIAGNOSIS — N318 Other neuromuscular dysfunction of bladder: Secondary | ICD-10-CM | POA: Insufficient documentation

## 2012-12-15 DIAGNOSIS — E0590 Thyrotoxicosis, unspecified without thyrotoxic crisis or storm: Secondary | ICD-10-CM | POA: Insufficient documentation

## 2012-12-15 MED ORDER — METHIMAZOLE 5 MG PO TABS: 5.0000 mg | ORAL_TABLET | Freq: Every day | ORAL | Status: DC

## 2012-12-15 NOTE — Telephone Encounter (Signed)
please call patient: i have reviewed labs. i have sent a prescription to your pharmacy, to normalize the thyroid. if ever you have fever while taking methimazole, stop it and call us, because of the risk of a rare side-effect Please come back for a follow-up appointment in 1 month.

## 2012-12-16 NOTE — Telephone Encounter (Signed)
Left message pt to call back and confirm

## 2013-03-18 ENCOUNTER — Other Ambulatory Visit: Payer: Self-pay | Admitting: Gastroenterology

## 2013-03-18 DIAGNOSIS — R110 Nausea: Secondary | ICD-10-CM

## 2013-03-18 DIAGNOSIS — R1013 Epigastric pain: Secondary | ICD-10-CM

## 2013-03-30 ENCOUNTER — Ambulatory Visit (HOSPITAL_COMMUNITY): Payer: BC Managed Care – PPO

## 2016-03-19 ENCOUNTER — Encounter (HOSPITAL_COMMUNITY): Payer: Self-pay | Admitting: Emergency Medicine

## 2016-03-19 ENCOUNTER — Emergency Department (HOSPITAL_COMMUNITY): Payer: BC Managed Care – PPO

## 2016-03-19 ENCOUNTER — Emergency Department (HOSPITAL_COMMUNITY)
Admission: EM | Admit: 2016-03-19 | Discharge: 2016-03-19 | Disposition: A | Discharge status: Home / Self Care | Payer: BC Managed Care – PPO | Attending: Emergency Medicine | Admitting: Emergency Medicine

## 2016-03-19 DIAGNOSIS — M25512 Pain in left shoulder: Secondary | ICD-10-CM | POA: Diagnosis present

## 2016-03-19 DIAGNOSIS — Z79899 Other long term (current) drug therapy: Secondary | ICD-10-CM | POA: Diagnosis not present

## 2016-03-19 DIAGNOSIS — I10 Essential (primary) hypertension: Secondary | ICD-10-CM | POA: Insufficient documentation

## 2016-03-19 DIAGNOSIS — R51 Headache: Secondary | ICD-10-CM | POA: Insufficient documentation

## 2016-03-19 DIAGNOSIS — Y999 Unspecified external cause status: Secondary | ICD-10-CM | POA: Insufficient documentation

## 2016-03-19 DIAGNOSIS — M7918 Myalgia, other site: Secondary | ICD-10-CM

## 2016-03-19 DIAGNOSIS — Y939 Activity, unspecified: Secondary | ICD-10-CM | POA: Diagnosis not present

## 2016-03-19 DIAGNOSIS — J45909 Unspecified asthma, uncomplicated: Secondary | ICD-10-CM | POA: Diagnosis not present

## 2016-03-19 DIAGNOSIS — M542 Cervicalgia: Secondary | ICD-10-CM | POA: Diagnosis not present

## 2016-03-19 DIAGNOSIS — Y92410 Unspecified street and highway as the place of occurrence of the external cause: Secondary | ICD-10-CM | POA: Diagnosis not present

## 2016-03-19 MED ORDER — OXYCODONE-ACETAMINOPHEN 5-325 MG PO TABS
1.0000 | ORAL_TABLET | Freq: Once | ORAL | Status: AC
  Administered 2016-03-19: 1 via ORAL
  Filled 2016-03-19: qty 1

## 2016-03-19 MED ORDER — METHOCARBAMOL 500 MG PO TABS: 500.0000 mg | ORAL_TABLET | Freq: Two times a day (BID) | ORAL | 0 refills | Status: DC

## 2016-03-19 MED ORDER — OXYCODONE-ACETAMINOPHEN 5-325 MG PO TABS: 2.0000 | ORAL_TABLET | ORAL | 0 refills | Status: DC | PRN

## 2016-03-19 MED ORDER — METHOCARBAMOL 1000 MG/10ML IJ SOLN: 1000.0000 mg | Freq: Once | INTRAMUSCULAR | Status: DC

## 2016-03-19 MED ORDER — METHOCARBAMOL 500 MG PO TABS
1000.0000 mg | ORAL_TABLET | Freq: Once | ORAL | Status: AC
  Administered 2016-03-19: 1000 mg via ORAL
  Filled 2016-03-19: qty 2

## 2016-03-19 NOTE — ED Notes (Signed)
Patient transported to CT 
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
## 2016-03-19 NOTE — ED Notes (Signed)
Pt ambulatory and independent at discharge.  Verbalized understanding of discharge instructions 
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
## 2017-08-23 ENCOUNTER — Other Ambulatory Visit: Payer: Self-pay

## 2017-08-23 ENCOUNTER — Encounter (HOSPITAL_COMMUNITY): Payer: Self-pay

## 2017-08-23 ENCOUNTER — Inpatient Hospital Stay (HOSPITAL_COMMUNITY)
Admission: AD | Admit: 2017-08-23 | Discharge: 2017-08-27 | DRG: 885 | Disposition: A | Discharge status: Home / Self Care | Payer: BC Managed Care – PPO | Source: Other Acute Inpatient Hospital | Attending: Psychiatry | Admitting: Psychiatry

## 2017-08-23 DIAGNOSIS — E876 Hypokalemia: Secondary | ICD-10-CM | POA: Diagnosis present

## 2017-08-23 DIAGNOSIS — F419 Anxiety disorder, unspecified: Secondary | ICD-10-CM | POA: Diagnosis present

## 2017-08-23 DIAGNOSIS — Z79899 Other long term (current) drug therapy: Secondary | ICD-10-CM | POA: Diagnosis not present

## 2017-08-23 DIAGNOSIS — F319 Bipolar disorder, unspecified: Principal | ICD-10-CM | POA: Diagnosis present

## 2017-08-23 DIAGNOSIS — F3112 Bipolar disorder, current episode manic without psychotic features, moderate: Secondary | ICD-10-CM | POA: Diagnosis present

## 2017-08-23 DIAGNOSIS — I10 Essential (primary) hypertension: Secondary | ICD-10-CM | POA: Diagnosis present

## 2017-08-23 DIAGNOSIS — F3131 Bipolar disorder, current episode depressed, mild: Secondary | ICD-10-CM | POA: Diagnosis present

## 2017-08-23 DIAGNOSIS — G4700 Insomnia, unspecified: Secondary | ICD-10-CM | POA: Diagnosis present
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
## 2017-08-23 NOTE — Tx Team (Signed)
Initial Treatment Plan 08/23/2017 11:17 PM Loura PardonLucia E Pracht [id number]    PATIENT STRESSORS: Marital or family conflict   PATIENT STRENGTHS: Average or above average intelligence Supportive family/friends   PATIENT IDENTIFIED PROBLEMS:   Depression "Im here by mistake because of my husband. "  Anxiety "Dont know why Im here"                 DISCHARGE CRITERIA:  Ability to meet basic life and health needs Adequate post-discharge living arrangements Improved stabilization in mood, thinking, and/or behavior Medical problems require only outpatient monitoring Motivation to continue treatment in a less acute level of care Need for constant or close observation no longer present Reduction of life-threatening or endangering symptoms to within safe limits Safe-care adequate arrangements made Verbal commitment to aftercare and medication compliance  PRELIMINARY DISCHARGE PLAN: Attend aftercare/continuing care group Outpatient therapy Participate in family therapy  PATIENT/FAMILY INVOLVEMENT: This treatment plan has been presented to and reviewed with the patient, Docia FurlLucia E Caras.  The patient has been given the opportunity to ask questions and make suggestions.  Sylvan CheeseSteven M Melvinia Ashby, RN 08/23/2017, 11:17 PM

## 2017-08-23 NOTE — BH Assessment (Signed)
Tele Assessment Note   Patient Name: Anna [name] MRN: [id number] Referring Physician: Dr Littie Deeds at [facility] Location of Patient: [id number] IP ADULT Location of Provider: Behavioral Health TTS Department   Patient was brought to the [facility] Emergency Department by her husband due to an increase in her mania and not sleeping for the past three days. Patient has been more animated than usual and her behavior increasingly bizarre. Patient is currently taking Risperdal prescribed by her PCP for her bipolar disorder.  Patient states, "I do not go to psychiatrists because they want to use the newest medications on me and I know what works."  Patient states that she cycles and has ups and downs every three weeks or so and states that she is on her upward swing presently due to the fact that she has not been sleeping for several days. Patient states that she has been going through these cycles her whole life and states that it eventually straightens itself out. Patient states that she feels like she is being punished by her family by having to come to the hospital.  She states that her family does not understand her mental health issues. Patient was very restless and irritable during her assessment process. Patient states, "I am very angry right now." Patient states that she is currently not suicidal and denies any previous attempts.  Patient denies HI and psychosis.  She denied any any substance use, but tested positive for amphetamines on her UDS.  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  Patient states that on a good night that she sleeps 2-3 hours.  She states that her appetite is good.  She states that she is currently stressed with her family members and their lack of support and understanding.  Patient states, "there is nothing wrong with me.   Patient is alert and oriented.  Her memory appeared to be intact.  She is restless and rocking and her speech is pressured.  Her eye contact was good.  Her mood is irritable and angry.  Her thoughts are coherent and goal directed, but her affect is somewhat bizarre.  Diagnosis: Bipolar Disorder Manic  Past Medical History:  Past Medical History:  Diagnosis Date  . Arthritis   . Asthma    seasonal  . GERD (gastroesophageal reflux disease)    occ  . History of recurrent UTIs 1/14  . Hypertension     Past Surgical History:  Procedure Laterality Date  . ABDOMINAL HYSTERECTOMY    . ANTERIOR CERVICAL DECOMP/DISCECTOMY FUSION N/A 07/13/2012   Procedure: ANTERIOR CERVICAL DECOMPRESSION/DISCECTOMY FUSION 1 LEVEL;  Surgeon: Mariam Dollar, MD;  Location: [facility];  Service: Neurosurgery;  Laterality: N/A;  Anterior Cervical Decompression/Discectomy Cervical Three-Four  . CARPAL TUNNEL RELEASE Right   . CERVICAL DISC SURGERY  09  . CHOLECYSTECTOMY      Family History: No family history on file.  Social History:  reports that she has never smoked. She does not have any smokeless tobacco history on file. She reports that she does not drink alcohol or use drugs.  Additional Social History:  Alcohol / Drug Use Pain Medications: hx of prescriptio pain pill abuse, but was treated in 2016.  Denies current usage Prescriptions: denies any abuse Over the Counter: denies any abuse History of alcohol / drug use?: Yes Longest period  of sobriety (when/how long): patient states that she has not used opioids in two years Negative Consequences of Use: Personal relationships Substance #1 Name of Substance 1: opioids 1 - Age of First Use: unknown 1 - Amount (size/oz): hx of prescription abuse 1 - Frequency: hx of daily use 1 - Duration: unknown 1 - Last Use / Amount: last use was two to three disorders  CIWA:   COWS:    Allergies:  Allergies  Allergen Reactions  . Avelox [Moxifloxacin Hcl In  Nacl] Anaphylaxis  . Lactose Intolerance (Gi) Diarrhea  . Tape Other (See Comments)    If tape on a while will pull off skin    Home Medications:  No medications prior to admission.    OB/GYN Status:  No LMP recorded. Patient has had a hysterectomy.  General Assessment Data Location of Assessment: (P) [facility] Assessment Services TTS Assessment: (P) Out of system Is this a Tele or Face-to-Face Assessment?: (P) Tele Assessment Is this an Initial Assessment or a Re-assessment for this encounter?: (P) Initial Assessment Marital status: (P) Married [place] name: (P) not assessed Is patient pregnant?: (P) No Pregnancy Status: (P) No Living Arrangements: (P) Spouse/significant other Can pt return to current living arrangement?: (P) Yes Admission Status: (P) Involuntary Is patient capable of signing voluntary admission?: (P) No Referral Source: (P) Other([facility]) Insurance type: (P) Programmer, applications Employee     Crisis Care Plan Living Arrangements: (P) Spouse/significant other Legal Guardian: (P) Other:(self) Name of Psychiatrist: (P) (none, sees PCP for medications) Name of Therapist: (P) none  Education Status Is patient currently in school?: (P) No Is the patient employed, unemployed or receiving disability?: (P) Unemployed  Risk to self with the past 6 months Suicidal Ideation: (P) No Has patient been a risk to self within the past 6 months prior to admission? : (P) No Suicidal Intent: (P) No Has patient had any suicidal intent within the past 6 months prior to admission? : (P) No Is patient at risk for suicide?: (P) Yes(currently in a manic state and not sleeping.) Suicidal Plan?: (P) No Has patient had any suicidal plan within the past 6 months prior to admission? : (P) No Access to Means: (P) (unable to assess) What has been your use of drugs/alcohol within the last 12 months?: (P) (none reported) Previous Attempts/Gestures: (P) (patient denied any) How many times?:  (P) 0 Other Self Harm Risks: (P) (family strain) Triggers for Past Attempts: (P) None known Intentional Self Injurious Behavior: (P) None Family Suicide History: (P) No Recent stressful life event(s): (P) Turmoil (Comment)(pt states family does not understand mental illness) Persecutory voices/beliefs?: (P) No Depression: (P) Yes Depression Symptoms: (P) Despondent, Insomnia, Isolating, Fatigue, Loss of interest in usual pleasures, Feeling angry/irritable Substance abuse history and/or treatment for substance abuse?: (P) No Suicide prevention information given to non-admitted patients: (P) Not applicable  Risk to Others within the past 6 months Homicidal Ideation: (P) No Does patient have any lifetime risk of violence toward others beyond the six months prior to admission? : (P) No Thoughts of Harm to Others: (P) No Current Homicidal Intent: (P) No Current Homicidal Plan: (P) No Access to Homicidal Means: (P) No Identified Victim: (P) (none) History of harm to others?: (P) No Assessment of Violence: (P) None Noted Violent Behavior Description: (P) (none) Does patient have access to weapons?: (P) No Criminal Charges Pending?: (P) No Does patient have a court date: (P) No Is patient on probation?: (P) No  Psychosis Hallucinations: (P) None noted  Delusions: (P) None noted  Mental Status Report Appearance/Hygiene: (P) Disheveled Eye Contact: (P) Good Motor Activity: (P) Hyperactivity, Restlessness Speech: (P) Pressured Level of Consciousness: (P) Alert Mood: (P) Depressed, Anxious, Apathetic, Irritable Affect: (P) Anxious, Depressed, Labile Anxiety Level: (P) Moderate Thought Processes: (P) Circumstantial Judgement: (P) Impaired Orientation: (P) Person, Place, Time, Situation Obsessive Compulsive Thoughts/Behaviors: (P) Moderate  Cognitive Functioning Concentration: (P) Decreased Memory: (P) Recent Intact, Remote Intact Is patient IDD: (P) No Is patient DD?: (P)  No Insight: (P) Fair Impulse Control: (P) Poor Appetite: (P) Fair Have you had any weight changes? : (P) No Change Sleep: (P) Decreased Total Hours of Sleep: (P) 2 Vegetative Symptoms: (P) Decreased grooming  ADLScreening [facility] Assessment Services) Patient's cognitive ability adequate to safely complete daily activities?: Yes Patient able to express need for assistance with ADLs?: Yes Independently performs ADLs?: Yes (appropriate for developmental age)  Prior Inpatient Therapy Prior Inpatient Therapy: (P) Yes Prior Therapy Dates: (P) 2016 Prior Therapy Facilty/Provider(s): (P) ([facility]) Reason for Treatment: (P) (bipolar disorder)  Prior Outpatient Therapy Prior Outpatient Therapy: (P) No Does patient have an ACCT team?: (P) No Does patient have Intensive In-House Services?  : (P) No Does patient have [facility] services? : (P) No Does patient have P4CC services?: (P) No  ADL Screening (condition at time of admission) Patient's cognitive ability adequate to safely complete daily activities?: Yes Is the patient deaf or have difficulty hearing?: No Does the patient have difficulty seeing, even when wearing glasses/contacts?: No Does the patient have difficulty concentrating, remembering, or making decisions?: No Patient able to express need for assistance with ADLs?: Yes Does the patient have difficulty dressing or bathing?: No Independently performs ADLs?: Yes (appropriate for developmental age) Does the patient have difficulty walking or climbing stairs?: No Weakness of Legs: None Weakness of Arms/Hands: None     Therapy Consults (therapy consults require a physician order) PT Evaluation Needed: No OT Evalulation Needed: No SLP Evaluation Needed: No Abuse/Neglect Assessment (Assessment to be complete while patient is alone) Abuse/Neglect Assessment Can Be Completed: Yes Physical Abuse: Denies Verbal Abuse: Denies Sexual Abuse: Denies Exploitation of  patient/patient's resources: Denies Self-Neglect: Denies Values / Beliefs Cultural Requests During Hospitalization: Other (comment) Spiritual Requests During Hospitalization: None Consults Spiritual Care Consult Needed: No Social Work Consult Needed: No Merchant navy officerAdvance Directives (For Healthcare) Does Patient Have a Medical Advance Directive?: No Would patient like information on creating a medical advance directive?: No - Patient declined    Additional Information 1:1 In Past 12 Months?: (P) No CIRT Risk: (P) No Elopement Risk: (P) No Does patient have medical clearance?: (P) No     Disposition:  Disposition Initial Assessment Completed for this Encounter: (P) Yes Disposition of Patient: (P) Admit  This service was provided via telemedicine using a 2-way, interactive audio and video technology.  Names of all persons participating in this telemedicine service and their role in this encounter. Name: Marisue HumbleLucia [name] Role: patient  Name: Carmeline Kowal Role: TTS  Name:  Role:   Name:  Role:     Daphene CalamityDanny J Cailen Texeira 08/23/2017 2:50 PM

## 2017-08-24 DIAGNOSIS — F319 Bipolar disorder, unspecified: Principal | ICD-10-CM

## 2017-08-24 DIAGNOSIS — G4700 Insomnia, unspecified: Secondary | ICD-10-CM

## 2017-08-24 DIAGNOSIS — Z79899 Other long term (current) drug therapy: Secondary | ICD-10-CM

## 2017-08-24 MED ORDER — AMLODIPINE BESYLATE 5 MG PO TABS
5.0000 mg | ORAL_TABLET | Freq: Every day | ORAL | Status: DC
  Administered 2017-08-24 – 2017-08-27 (×4): 5 mg via ORAL
  Filled 2017-08-24 (×5): qty 1

## 2017-08-24 MED ORDER — TRAZODONE HCL 50 MG PO TABS
50.0000 mg | ORAL_TABLET | Freq: Every evening | ORAL | Status: DC | PRN
  Filled 2017-08-24 (×2): qty 1

## 2017-08-24 MED ORDER — POTASSIUM CHLORIDE CRYS ER 20 MEQ PO TBCR
20.0000 meq | EXTENDED_RELEASE_TABLET | Freq: Two times a day (BID) | ORAL | Status: AC
  Administered 2017-08-24 – 2017-08-26 (×4): 20 meq via ORAL
  Filled 2017-08-24 (×4): qty 1

## 2017-08-24 MED ORDER — IRBESARTAN 75 MG PO TABS
37.5000 mg | ORAL_TABLET | Freq: Every day | ORAL | Status: DC
  Administered 2017-08-24 – 2017-08-27 (×4): 37.5 mg via ORAL
  Filled 2017-08-24: qty 0.5
  Filled 2017-08-24: qty 1
  Filled 2017-08-24 (×4): qty 0.5

## 2017-08-24 MED ORDER — LORAZEPAM 1 MG PO TABS: 1.0000 mg | ORAL_TABLET | Freq: Four times a day (QID) | ORAL | Status: DC | PRN

## 2017-08-24 MED ORDER — RISPERIDONE 1 MG PO TABS
1.0000 mg | ORAL_TABLET | Freq: Two times a day (BID) | ORAL | Status: DC
  Administered 2017-08-24 – 2017-08-25 (×2): 1 mg via ORAL
  Filled 2017-08-24 (×4): qty 1

## 2017-08-24 MED ORDER — PAROXETINE HCL ER 12.5 MG PO TB24
12.5000 mg | ORAL_TABLET | Freq: Every day | ORAL | Status: DC
  Administered 2017-08-24: 12.5 mg via ORAL
  Filled 2017-08-24 (×2): qty 1

## 2017-08-24 MED ORDER — HYDROCHLOROTHIAZIDE 12.5 MG PO CAPS
12.5000 mg | ORAL_CAPSULE | Freq: Every day | ORAL | Status: DC
  Administered 2017-08-24 – 2017-08-27 (×4): 12.5 mg via ORAL
  Filled 2017-08-24 (×5): qty 1

## 2017-08-24 NOTE — [facility] Suicide Risk Assessment (Addendum)
[facility] Admission Suicide Risk Assessment   Nursing information obtained from:  Patient Demographic factors:  Caucasian Current Mental Status:  NA Loss Factors:  NA Historical Factors:  NA Risk Reduction Factors:  Employed, Living with another person, especially a relative, Positive social support, Positive therapeutic relationship  Total Time spent with patient: 45 minutes Principal Problem:  Bipolar Disorder  Diagnosis:   Patient Active Problem List   Diagnosis Date Noted  . Bipolar disorder (HCC) [F31.9] 08/23/2017  . Hyperthyroidism [E05.90] 12/15/2012  . Allergic rhinitis, cause unspecified [J30.9] 12/15/2012  . GERD (gastroesophageal reflux disease) [K21.9] 12/15/2012  . 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 Unspecified essential hypertension [I10] 12/15/2012   Subjective Data:   Continued Clinical Symptoms:  Alcohol Use Disorder Identification Test Final Score (AUDIT): 0 The "Alcohol Use Disorders Identification Test", Guidelines for Use in Primary Care, Second Edition.  World Science writer [facility]). Score between 0-7:  no or low risk or alcohol related problems. Score between 8-15:  moderate risk of alcohol related problems. Score between 16-19:  high risk of alcohol related problems. Score 20 or above:  warrants further diagnostic evaluation for alcohol dependence and treatment.   CLINICAL FACTORS:   Patient seen with Anna [name] , CSW  47 year old married female, employed as a Psychologist, forensic, has 2 adult biological children and 3 adult stepchildren, lives with her husband.  Currently presents as a fair historian, states that she does not remember circumstances that led to her admission.  States that her husband brought her to the hospital because he was concerned about her. As per chart notes patient was brought to [facility] ED  On 6/14 by her husband because of symptoms of  mania and no sleep for 3 days, with increasingly bizarre and  disorganized behavior.  In ED she initially presented irritable, pressured, restless, reporting feeling "angry". Notes also indicate she presented with dystonic type movements in ED- these are not noticed at this time. [facility] ED UDS positive for amphetamines. Today presents vaguely guarded, fair historian, but no overt irritability and no psychomotor agitation at this time.  Patient does endorse a history of Bipolar Disorder and states she has had "cycles" for many years.  History of prior psychiatric admission in 2016 at which time she was detoxed off opiates.  She denies history of suicide attempts and denies history of psychosis  Of note home medication list  includes Risperidone 1 mgr BID, Paxil XR 37.5 mgrs QDAY,   Adderall 30 mg QAM, 20 mgrs QPM. Patient states she takes these medications regularly and denies abusing Adderall. States " I just take it once a day".  Denies medical illnesses other than hypertension, treated with Tribenzor.  [facility] ED . History of hyperthyroidism diagnosis, was prescribed tapazole in the past, patient states she has not taken this medication in several years   Denies drug or alcohol abuse .  Dx- Bipolar Disorder- Manic. * based on patient's home medication regimen and report of initial  restlessness, agitation, and report of "dystonic" movements in ED would also consider possible Serotonin Syndrome, now improved .   Plan- inpatient admission. At this time will hold/discontinue Paxil and Adderall .   Continue Risperidone 1 mgr BID. KDUR supplementation for hypokalemia ( K+ 3.0 on 6/15, BUN 9, Creatinine 0.9) Ativan 1mg rs Q 6 hours PRN for anxiety or agitation Check BMP, CBC, TSH, EKG        Musculoskeletal: Strength & Muscle Tone:  within normal limits Gait & Station: normal Patient leans: N/A  Psychiatric Specialty Exam: Physical Exam  ROS denies chest pain, no shortness of breath,  no vomiting, no fever  Blood pressure (!) 135/99, pulse 96, temperature 98.7 F (37.1 C), temperature source Oral, resp. rate 18, height 5\' 4"  (1.626 m), weight 95.3 kg (210 lb), SpO2 99 %.Body mass index is 36.05 kg/m.  General Appearance: Fairly Groomed  Eye Contact:  Fair  Speech:  Normal Rate  Volume:  Normal  Mood:  states she feels "OK". Presents vaguely guarded, irritable   Affect:  guarded, vaguely irritable, does smile briefly at times   Thought Process:  Linear and Descriptions of Associations: Tangential  Orientation:  Other:  alert, attentive, oriented to June,2019, but states it is Friday, oriented to being in a " mental hospital"  Thought Content:  denies hallucinations, and currently does not present internally preoccupied. She does present guarded and somewhat suspicious, and states she " does not trust psychiatrists "  Suicidal Thoughts:  No at this time denies suicidal ideations, denies self injurious ideations, denies homicidal ideations  Homicidal Thoughts:  No  Memory:  recall 3/3 immediate, 2/3 at 3 minutes, Spelled WORLD correctly, made one mistake with reverse spelling    Judgement:  Fair  Insight:  Fair  Psychomotor Activity:  currently not restless or agitated, does not appear in any acute distress   Concentration:  Concentration: Fair and Attention Span: Fair  Recall:  FiservFair  Fund of Knowledge:  Fair  Language:  Fair  Akathisia:  Negative  Handed:  Right  AIMS (if indicated):   no abnormal movements noted or reported at this time  Assets:  Desire for Improvement Resilience  ADL's:  Fair   Cognition:  Impaired,  Mild  Sleep:  Number of Hours: 6      COGNITIVE FEATURES THAT CONTRIBUTE TO RISK:  Closed-mindedness and Loss of executive function    SUICIDE RISK:   Moderate:  Frequent suicidal ideation with limited intensity, and duration, some specificity in terms of plans, no associated intent, good self-control, limited dysphoria/symptomatology, some risk  factors present, and identifiable protective factors, including available and accessible social support.  PLAN OF CARE: Patient will be admitted to inpatient psychiatric unit for stabilization and safety. Will provide and encourage milieu participation. Provide medication management and maked adjustments as needed.  Will follow daily.    I certify that inpatient services furnished can reasonably be expected to improve the patient's condition.   Craige CottaFernando A Javyn Havlin, MD 08/24/2017, 3:56 PM
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
## 2017-08-24 NOTE — Plan of Care (Signed)
Patient shows minimal participation in group therapy sessions, but is otherwise cooperative and pleasant. She is medication compliant and was able to get some sleep last night despite arriving over night.

## 2017-08-24 NOTE — [facility] Group Notes (Signed)
[facility] Group Notes:  (Nursing/MHT/Case Management/Adjunct)  Date:  08/24/2017  Time:  4:07 PM  Type of Therapy:  Psychoeducational Skills  Participation Level:  Minimal  Participation Quality:  Inattentive  Affect:  Flat  Cognitive:  Confused  Insight:  Lacking  Engagement in Group:  Limited  Modes of Intervention:  Discussion and Education  Summary of Progress/Problems:  In this group we discussed health boundaries. We defined what porous, rigid, and healthy boundaries looked like when applied to our lives. We also discussed the different types of boundaries. RN encouraged them to complete the worksheet at the end of the lesson on their own.  Patient had minimal participation in group, did not share, and left early.  Anna MirzaJonathan C Azavion [name] 08/24/2017, 4:07 PM

## 2017-08-24 NOTE — [facility] Group Notes (Signed)
[facility] LCSW Group Therapy Note  Date/Time:  08/24/2017  11:00AM-12:00PM  Type of Therapy and Topic:  Group Therapy:  Music and Mood  Participation Level:  Did Not Attend   Description of Group: In this process group, members listened to a variety of genres of music and identified that different types of music evoke different responses.  Patients were encouraged to identify music that was soothing for them and music that was energizing for them.  Patients discussed how this knowledge can help with wellness and recovery in various ways including managing depression and anxiety as well as encouraging healthy sleep habits.    Therapeutic Goals: 1. Patients will explore the impact of different varieties of music on mood 2. Patients will verbalize the thoughts they have when listening to different types of music 3. Patients will identify music that is soothing to them as well as music that is energizing to them 4. Patients will discuss how to use this knowledge to assist in maintaining wellness and recovery 5. Patients will explore the use of music as a coping skill  Summary of Patient Progress:  N/A  Therapeutic Modalities: Solution Focused Brief Therapy Activity   Jamen Loiseau Grossman-Orr, LCSW    

## 2017-08-24 NOTE — [facility] Group Notes (Signed)
Adult Psychoeducational Group Note  Date:  08/24/2017 Time:  9:30 AM  Group Topic/Focus: Orientation/Goals Group Orientation:   The focus of this group is to educate the patient on the purpose and policies of crisis stabilization and provide a format to answer questions about their admission.  The group details unit policies and expectations of patients while admitted. Goals Group:   The focus of this group is to help patients establish daily goals to achieve during treatment and discuss how the patient can incorporate goal setting into their daily lives to aide in recovery.   Participation Level:  Poor  Participation Quality:  Minimal  Affect:  Inattentive  Cognitive:  Alert and Oriented  Insight: Limited/Lacking  Engagement in Group:  Developing/Improving  Modes of Intervention:  Discussion and Education  Additional Comments:  Patient attended group but did not contribute to discussion.  Marchelle Folksmanda A Rainie Crenshaw 08/24/2017, 10:00 AM

## 2017-08-24 NOTE — [facility] Group Notes (Signed)
Adult Psychoeducational Group Note  Date:  08/24/2017 Time:  10:00 AM  Group Topic/Focus: Love Language Healthy Communication:   The focus of this group is to discuss communication, barriers to communication, as well as healthy ways to communicate with others.  Participation Level:  Poor  Participation Quality:  Minimal  Affect:  Inattentive  Cognitive:  Alert and Oriented  Insight: Limited/Lacking  Engagement in Group:  Developing/Improving  Modes of Intervention:  Discussion and Education  Additional Comments:  Patient was attentive and observed taking Love Language Quiz but did not contribute to discussion.  Marchelle Folksmanda A Brylin Stanislawski 08/24/2017, 10:30 AM
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
## 2017-08-24 NOTE — Progress Notes (Signed)
D: Patient presents confused, disoriented to situation. She denies symptoms of depression today. Patient lives with her husband [facility]. She was not sleeping well prior to admission, but did get >6 hr of sleep last night. Patient denies SI/HI/AVH. BP elevated this am at 135/99 and BP meds were restarted by provider. A: Patient checked q15 min, and checks reviewed. Reviewed medication changes with patient and educated on side effects. Educated patient on importance of attending group therapy sessions and educated on several coping skills. Encouarged participation in milieu through recreation therapy and attending meals with peers. Support and encouragement provided. R: Patient receptive to education on medications, and is medication compliant. Patient attended a group therapy session today, but had minimal participation. She is attending the cafeteria with peers. Patient contracts for safety on the unit.

## 2017-08-25 DIAGNOSIS — F3112 Bipolar disorder, current episode manic without psychotic features, moderate: Secondary | ICD-10-CM

## 2017-08-25 LAB — LIPID PANEL
Cholesterol: 160 mg/dL (ref 0–200)
HDL: 43 mg/dL (ref >40)
LDL Cholesterol: 92 mg/dL (ref 0–99)
Total CHOL/HDL Ratio: 3.7 RATIO
Triglycerides: 126 mg/dL (ref <150)
VLDL: 25 mg/dL (ref 0–40)

## 2017-08-25 LAB — HEMOGLOBIN A1C
Hgb A1c MFr Bld: 5.3 % (ref 4.8–5.6)
Mean Plasma Glucose: 105.41 mg/dL

## 2017-08-25 LAB — TSH: TSH: 0.382 u[IU]/mL (ref 0.350–4.500)

## 2017-08-25 MED ORDER — RISPERIDONE 2 MG PO TABS
2.0000 mg | ORAL_TABLET | Freq: Every day | ORAL | Status: DC
  Administered 2017-08-25: 2 mg via ORAL
  Filled 2017-08-25 (×3): qty 1

## 2017-08-25 MED ORDER — HYDROXYZINE HCL 50 MG PO TABS
50.0000 mg | ORAL_TABLET | Freq: Four times a day (QID) | ORAL | Status: DC | PRN
  Administered 2017-08-25 – 2017-08-27 (×4): 50 mg via ORAL
  Filled 2017-08-25 (×4): qty 1

## 2017-08-25 MED ORDER — RISPERIDONE 1 MG PO TABS
1.0000 mg | ORAL_TABLET | ORAL | Status: DC
  Administered 2017-08-26: 1 mg via ORAL
  Filled 2017-08-25 (×3): qty 1

## 2017-08-25 MED ORDER — TRAZODONE HCL 50 MG PO TABS
50.0000 mg | ORAL_TABLET | Freq: Every evening | ORAL | Status: DC | PRN
  Administered 2017-08-25 – 2017-08-26 (×2): 50 mg via ORAL
  Filled 2017-08-25 (×2): qty 1

## 2017-08-25 NOTE — Plan of Care (Signed)
Problem: Safety: Goal: Periods of time without injury will increase Intervention: Patient contracts for safety on the unit. Low fall risk precautions in place. Safety monitored with q15 minute checks. Outcome: Patient remains safe on the unit at this time. 08/25/2017 3:48 PM - Progressing by Ferrel Loganollazo, Daune Colgate A, RN

## 2017-08-25 NOTE — Progress Notes (Addendum)
Patient ID: Loura PardonLucia E Finch, female   DOB: 15-Dec-1970, 47 y.o.   MRN: [id number]  Nursing Progress Note [id number]  Data: Patient presents with flat/blank affect and depressed mood. Patient complaint with scheduled medications. Patient denies pain/physical complaints. Patient completed self-inventory sheet and rates depression, hopelessness, and anxiety 3,4,5 respectively. Patient rates their sleep and appetite as good/good respectively. Patient states goal for today is to "balance meds".  Patient is isolative to her room and attends a few groups. Patient currently denies SI/HI but reports on-going AVH.   Action: Patient educated about and provided medication per provider's orders. Patient safety maintained with q15 min safety checks and frequent rounding. Low fall risk precautions in place. Emotional support given. 1:1 interaction and active listening provided. Patient encouraged to attend meals and groups. Patient encouraged to work on treatment plan and goals. Labs, vital signs and patient behavior monitored throughout shift.   Response: Patient agrees to come to staff if any thoughts of SI/HI develop or if patient develops intention of acting on thoughts. Patient remains safe on the unit at this time. Patient is interacting with peers appropriately on the unit. Will continue to support and monitor.

## 2017-08-25 NOTE — Progress Notes (Signed)
Pt observed in room resting with eyes closed. Pt does not look to be in any acute distress. Will continue to monitor Pt for safety.

## 2017-08-25 NOTE — [facility] Group Notes (Signed)
LCSW Group Therapy Note   08/25/2017 1:15pm   Type of Therapy and Topic:  Group Therapy:  Overcoming Obstacles   Participation Level:  Active   Description of Group:    In this group patients will be encouraged to explore what they see as obstacles to their own wellness and recovery. They will be guided to discuss their thoughts, feelings, and behaviors related to these obstacles. The group will process together ways to cope with barriers, with attention given to specific choices patients can make. Each patient will be challenged to identify changes they are motivated to make in order to overcome their obstacles. This group will be process-oriented, with patients participating in exploration of their own experiences as well as giving and receiving support and challenge from other group members.   Therapeutic Goals: 1. Patient will identify personal and current obstacles as they relate to admission. 2. Patient will identify barriers that currently interfere with their wellness or overcoming obstacles.  3. Patient will identify feelings, thought process and behaviors related to these barriers. 4. Patient will identify two changes they are willing to make to overcome these obstacles:      Summary of Patient Progress   Stayed the entire time, engaged throughout.  "I was stressed at work-too much stress-and it sent me into a manic state." Talked about how she uses breathing exercises at home to help, "but I know know I need to do it throughout the day, whenever I feel the stress increasing.  5 minutes, and I am good to go again."   Therapeutic Modalities:   Cognitive Behavioral Therapy Solution Focused Therapy Motivational Interviewing Relapse Prevention Therapy  Ida RogueRodney B Brooks Stotz, LCSW 08/25/2017 3:25 PM

## 2017-08-25 NOTE — Progress Notes (Signed)
Recreation Therapy Notes  Date: 6.17.19 Time: 1000 Location: 500 Hall Dayroom  Group Topic: Triggers  Goal Area(s) Addresses:  Patient will be able to identify triggers.  Patient will identify problems caused by triggers. Patient will identify three coping skills to deal with triggers.  Intervention: Worksheet  Activity: Triggers.  LRT gave patients a worksheet in which patients were to identify their triggers, the problems their triggers contribute to and come up with a trigger for each category (emotional state, people, places, things, thoughts and activities/situations) presented.  Education:Communication, Discharge Planning  Education Outcome: Acknowledges understanding/In group clarification offered/Needs additional education.   Clinical Observations/Feedback:  Pt did not attend group.    Caroll RancherMarjette Unnamed Zeien, LRT/CTRS         Caroll RancherLindsay, Vilda Zollner A 08/25/2017 12:38 PM

## 2017-08-25 NOTE — Progress Notes (Signed)
D: Pt was in the hallway upon initial approach.  She presents with depressed affect and mood.  Pt brightens with interaction.  She describes her day as "good" and reports goal is to "sleep all night."  Pt denies SI/HI, hallucinations, and pain.  She has been visible in milieu interacting with peers and staff appropriately.  Pt attended evening group.  A: Introduced self to pt.  Met with pt 1:1 and provided support and encouragement.  Medication administered per order.  PRN medication administered for sleep and anxiety.  Q15 minute safety checks maintained.  R: Pt is compliant with medications. She verbally contracts for safety and reports she will inform staff of needs and concerns.  Will continue to monitor and assess.

## 2017-08-25 NOTE — Progress Notes (Signed)
[facility] MD Progress Note  08/25/2017 12:32 PM Anna [name]  MRN:  [id number] Subjective:    Anna [name] is a 47 y/o F with history of Bipolar I who was admitted from [facility] ED where she was brought in by her husband with worsening symptoms of mania including irritability, disorganized behaviors, and decreased need for sleep for 3 days. Pt was transferred to [facility] for additional treatment and stabilization. Pt was restarted on home medication of risperdal, and home medications of adderall and paxil were held.   Today upon evaluation, pt shares, "I don't really remember what happened, but my husband has helped me figure it out. I was getting hyper. My heart was pumping. I was getting angrier and angrier. I was throwing things. That's really uncharacteristic for me." Pt reports that she is feeling better today, and she has been sleeping well. She denies any specific concerns. She denies physical complaints. She denies SI/HI/AH/VH. Discussed with patient that her symptoms resemble manic episode and both of her home medications of paxil and adderall may have contributed to this; she is in agreement to remain off of those medications and to attempt risperdal at a larger dose. She also takes lorazepam at home, and we discussed trial of vistaril instead, to which pt was in agreement. She agreed to the overall plan above, and she had no further questions, comments, or concerns.  Principal Problem: Bipolar I disorder, most recent episode (or current) manic, moderate (HCC) Diagnosis:   Patient Active Problem List   Diagnosis Date Noted  . Bipolar I disorder, most recent episode (or current) manic, moderate (HCC) [F31.12] 08/23/2017  . Hyperthyroidism [E05.90] 12/15/2012  . Allergic rhinitis, cause unspecified [J30.9] 12/15/2012  . GERD (gastroesophageal reflux disease) [K21.9] 12/15/2012  . 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 Unspecified essential hypertension [I10] 12/15/2012   Total Time spent with patient: 30 minutes  Past Psychiatric History: see H&P  Past Medical History:  Past Medical History:  Diagnosis Date  . Arthritis   . Asthma    seasonal  . GERD (gastroesophageal reflux disease)    occ  . History of recurrent UTIs 1/14  . Hypertension     Past Surgical History:  Procedure Laterality Date  . ABDOMINAL HYSTERECTOMY    . ANTERIOR CERVICAL DECOMP/DISCECTOMY FUSION N/A 07/13/2012   Procedure: ANTERIOR CERVICAL DECOMPRESSION/DISCECTOMY FUSION 1 LEVEL;  Surgeon: Mariam Dollar, MD;  Location: [facility];  Service: Neurosurgery;  Laterality: N/A;  Anterior Cervical Decompression/Discectomy Cervical Three-Four  . CARPAL TUNNEL RELEASE Right   . CERVICAL DISC SURGERY  09  . CHOLECYSTECTOMY     Family History: History reviewed. No pertinent family history. 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 Marital status: Married    Spouse name: Not on file  . Number of children: Not on file  . Years of education: Not on file  . Highest education level: Not on file  Occupational History  . Not on file  Social Needs  . Financial resource strain: Not on file  . Food insecurity:    Worry: Not on file    Inability: Not on file  . Transportation needs:  Medical: Not on file    Non-medical: Not on file  Tobacco Use  . Smoking status: Never Smoker  . Smokeless tobacco: Never Used  Substance and Sexual Activity  . Alcohol use: No  . 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 Sexual activity: Not on file  Lifestyle  . Physical activity:    Days per week: Not on file    Minutes per session: Not on file  . Stress: Not on file   Relationships  . Social connections:    Talks on phone: Not on file    Gets together: Not on file    Attends religious service: Not on file    Active member of club or organization: Not on file    Attends meetings of clubs or organizations: Not on file    Relationship status: Not on file  Other Topics Concern  . Not on file  Social History Narrative  . Not on file   Additional Social History:    Pain Medications: hx of prescriptio pain pill abuse, but was treated in 2016.  Denies current usage Prescriptions: denies any abuse Over the Counter: denies any abuse History of alcohol / drug use?: Yes Longest period of sobriety (when/how long): patient states that she has not used opioids in two years Negative Consequences of Use: Personal relationships Name of Substance 1: opioids 1 - Age of First Use: unknown 1 - Amount (size/oz): hx of prescription abuse 1 - Frequency: hx of daily use 1 - Duration: unknown 1 - Last Use / Amount: last use was two to three disorders                  Sleep: Good  Appetite:  Good  Current Medications: Current Facility-Administered Medications  Medication Dose Route Frequency Provider Last Rate Last Dose  . amLODipine (NORVASC) tablet 5 mg  5 mg Oral Daily Oneta Rack, NP   5 mg at 08/25/17 0748  . hydrochlorothiazide (MICROZIDE) capsule 12.5 mg  12.5 mg Oral Daily Oneta Rack, NP   12.5 mg at 08/25/17 0748  . hydrOXYzine (ATARAX/VISTARIL) tablet 50 mg  50 mg Oral Q6H PRN Micheal Likens, MD      . irbesartan (AVAPRO) tablet 37.5 mg  37.5 mg Oral Daily Oneta Rack, NP   37.5 mg at 08/25/17 0747  . potassium chloride SA (K-DUR,KLOR-CON) CR tablet 20 mEq  20 mEq Oral BID Oneta Rack, NP   20 mEq at 08/25/17 0748  . [START ON 08/26/2017] risperiDONE (RISPERDAL) tablet 1 mg  1 mg Oral BH-q7a Micheal Likens, MD       And  . risperiDONE (RISPERDAL) tablet 2 mg  2 mg Oral QHS Micheal Likens, MD      .  traZODone (DESYREL) tablet 50 mg  50 mg Oral QHS PRN Micheal Likens, MD        Lab Results:  Results for orders placed or performed during the hospital encounter of 08/23/17 (from the past 48 hour(s))  Lipid panel     Status: None   Collection Time: 08/25/17  6:29 AM  Result Value Ref Range   Cholesterol 160 0 - 200 mg/dL   Triglycerides 161 <096 mg/dL   HDL 43 >04 mg/dL   Total CHOL/HDL Ratio 3.7 RATIO   VLDL 25 0 - 40 mg/dL   LDL Cholesterol 92 0 - 99 mg/dL    Comment:  Total Cholesterol/HDL:CHD Risk Coronary Heart Disease Risk Table                     Men   Women  1/2 Average Risk   3.4   3.3  Average Risk       5.0   4.4  2 X Average Risk   9.6   7.1  3 X Average Risk  23.4   11.0        Use the calculated Patient Ratio above and the CHD Risk Table to determine the patient's CHD Risk.        ATP III CLASSIFICATION (LDL):  <100     mg/dL   Optimal  [id number]  mg/dL   Near or Above                    Optimal  130-159  mg/dL   Borderline  [id number]  mg/dL   High  >811>190     mg/dL   Very High Performed at [facility], [address] [place], [place] [address]   Hemoglobin A1c     Status: None   Collection Time: 08/25/17  6:29 AM  Result Value Ref Range   Hgb A1c MFr Bld 5.3 4.8 - 5.6 %    Comment: (NOTE) Pre diabetes:          5.7%-6.4% Diabetes:              >6.4% Glycemic control for   <7.0% adults with diabetes    Mean Plasma Glucose 105.41 mg/dL    Comment: Performed at [facility], [address] [place], [place] [address]  TSH     Status: None   Collection Time: 08/25/17  6:29 AM  Result Value Ref Range   TSH 0.382 0.350 - 4.500 uIU/mL    Comment: Performed by a 3rd Generation assay with a functional sensitivity of <=0.01 uIU/mL. Performed at [facility], [address] [place], [place] [address]     Blood Alcohol level:  No results found for: [facility]  Metabolic Disorder Labs: Lab Results   Component Value Date   HGBA1C 5.3 08/25/2017   MPG 105.41 08/25/2017   No results found for: PROLACTIN Lab Results  Component Value Date   CHOL 160 08/25/2017   TRIG 126 08/25/2017   HDL 43 08/25/2017   CHOLHDL 3.7 08/25/2017   VLDL 25 08/25/2017   LDLCALC 92 08/25/2017    Physical Findings: AIMS: Facial and Oral Movements Muscles of Facial Expression: None, normal Lips and Perioral Area: None, normal Jaw: None, normal Tongue: None, normal,Extremity Movements Upper (arms, wrists, hands, fingers): None, normal Lower (legs, knees, ankles, toes): None, normal, Trunk Movements Neck, shoulders, hips: None, normal, Overall Severity Severity of abnormal movements (highest score from questions above): None, normal Incapacitation due to abnormal movements: None, normal Patient's awareness of abnormal movements (rate only patient's report): No Awareness, Dental Status Current problems with teeth and/or dentures?: No Does patient usually wear dentures?: No  CIWA:  CIWA-Ar Total: 2 COWS:  COWS Total Score: 1  Musculoskeletal: Strength & Muscle Tone: within normal limits Gait & Station: normal Patient leans: N/A  Psychiatric Specialty Exam: Physical Exam  Nursing note and vitals reviewed.   Review of Systems  Constitutional: Negative for chills and fever.  Respiratory: Negative for cough and shortness of breath.   Cardiovascular: Negative for chest pain.  Gastrointestinal: Negative for abdominal pain, heartburn, nausea and vomiting.  Psychiatric/Behavioral:  Negative for depression, hallucinations and suicidal ideas. The patient is not nervous/anxious and does not have insomnia.     Blood pressure 104/78, pulse 100, temperature 98.4 F (36.9 C), temperature source Oral, resp. rate 20, height 5\' 4"  (1.626 m), weight 95.3 kg (210 lb), SpO2 99 %.Body mass index is 36.05 kg/m.  General Appearance: Casual and Fairly Groomed  Eye Contact:  Good  Speech:  Clear and Coherent and  Normal Rate  Volume:  Normal  Mood:  Euthymic  Affect:  Appropriate and Congruent  Thought Process:  Coherent and Goal Directed  Orientation:  Full (Time, Place, and Person)  Thought Content:  Logical  Suicidal Thoughts:  No  Homicidal Thoughts:  No  Memory:  Immediate;   Fair Recent;   Fair Remote;   Fair  Judgement:  Fair  Insight:  Fair  Psychomotor Activity:  Normal  Concentration:  Concentration: Fair  Recall:  [employer] of Knowledge:  Fair  Language:  Fair  Akathisia:  No  Handed:    AIMS (if indicated):     Assets:  Communication Skills Resilience Social Support  ADL's:  Intact  Cognition:  WNL  Sleep:  Number of Hours: 6.75   Treatment Plan Summary: Daily contact with patient to assess and evaluate symptoms and progress in treatment and Medication management   -Continue inpatient hospitalization  -Bipolar I, current episode manic  -Change risperdal 1mg  po BID to risperdal 1mg  po qAM + 2mg  po qhs  -Anxiety    -DC ativan  -Start vistaril 50mg  po q6h prn anxiety  -Insomnia   -Start trazodone 50mg  po qhs prn insomnia  -hypokalemia   -continue potassium chloride po BID for total of 4 doses  -HTN   -Continue HCTZ 12.5mg  po qDay  -Continue irbesartan 37.5mg  po qDay   -Continue amlodipine 5mg  po qDay  -Encourage participation in groups and therapeutic milieu  -Disposition planning will be ongoing  Micheal Likens, MD 08/25/2017, 12:32 PM

## 2017-08-25 NOTE — Tx Team (Signed)
Interdisciplinary Treatment and Diagnostic Plan Update  08/25/2017 Time of Session: 10:24 AM  Anna [name] MRN: [id number]  Principal Diagnosis: Bipolar disorder [facility])  Secondary Diagnoses: Principal Problem:   Bipolar disorder ([place])   Current Medications:  Current Facility-Administered Medications  Medication Dose Route Frequency Provider Last Rate Last Dose  . amLODipine (NORVASC) tablet 5 mg  5 mg Oral Daily Derrill Center, NP   5 mg at 08/25/17 0748  . hydrochlorothiazide (MICROZIDE) capsule 12.5 mg  12.5 mg Oral Daily Derrill Center, NP   12.5 mg at 08/25/17 0748  . irbesartan (AVAPRO) tablet 37.5 mg  37.5 mg Oral Daily Derrill Center, NP   37.5 mg at 08/25/17 0747  . LORazepam (ATIVAN) tablet 1 mg  1 mg Oral Q6H PRN Cobos, Fernando A, MD      . potassium chloride SA (K-DUR,KLOR-CON) CR tablet 20 mEq  20 mEq Oral BID Derrill Center, NP   20 mEq at 08/25/17 0748  . risperiDONE (RISPERDAL) tablet 1 mg  1 mg Oral BID Derrill Center, NP   1 mg at 08/25/17 7619    PTA Medications: Medications Prior to Admission  Medication Sig Dispense Refill Last Dose  . Olmesartan-amLODIPine-HCTZ (TRIBENZOR) 20-5-12.5 MG TABS Take 1 tablet by mouth every morning.     Marland Kitchen amphetamine-dextroamphetamine (ADDERALL XR) 30 MG 24 hr capsule Take 30 mg by mouth daily before breakfast.  0   . amphetamine-dextroamphetamine (ADDERALL) 20 MG tablet Take 20 mg by mouth daily. Takes at 5PM  0   . azelastine (ASTELIN) 137 MCG/SPRAY nasal spray Place 1 spray into the nose 2 (two) times daily as needed for rhinitis. Use in each nostril as directed   Not Taking at Unknown time  . Brompheniramine-Phenylephrine (COLD & ALLERGY PO) Take 1 tablet by mouth every 6 (six) hours as needed (allergy/cold symptoms).   Not Taking at Unknown time  . cyclobenzaprine (FLEXERIL) 10 MG tablet Take 1 tablet (10 mg total) by mouth 3 (three) times daily as needed for muscle spasms. (Patient not taking: Reported on 08/24/2017) 80 tablet 1  Not Taking at Unknown time  . LORazepam (ATIVAN) 0.5 MG tablet Take 0.5-1 mg by mouth as needed. Takes for panic attacks     . methimazole (TAPAZOLE) 5 MG tablet Take 1 tablet (5 mg total) by mouth daily. (Patient not taking: Reported on 08/24/2017) 30 tablet 1 Not Taking at Unknown time  . methocarbamol (ROBAXIN) 500 MG tablet Take 1 tablet (500 mg total) by mouth 2 (two) times daily. (Patient not taking: Reported on 08/24/2017) 20 tablet 0 Completed Course at Unknown time  . Multiple Vitamin (MULTIVITAMIN WITH MINERALS) TABS Take 1 tablet by mouth daily.   Taking  . Olmesartan-Amlodipine-HCTZ (TRIBENZOR) 20-5-12.5 MG TABS Take 1 tablet by mouth daily.   Taking  . oxyCODONE (ROXICODONE) 5 MG immediate release tablet 35m take 1-2 po Q4h prn (Patient not taking: Reported on 08/24/2017) 80 tablet 0 Completed Course at Unknown time  . oxyCODONE-acetaminophen (PERCOCET/ROXICET) 5-325 MG tablet Take 2 tablets by mouth every 4 (four) hours as needed for severe pain. (Patient not taking: Reported on 08/24/2017) 7 tablet 0 Completed Course at Unknown time  . PARoxetine (PAXIL-CR) 37.5 MG 24 hr tablet Take 37.5 mg by mouth daily with breakfast.     . pravastatin (PRAVACHOL) 20 MG tablet Take 20 mg by mouth daily.  1   . risperiDONE (RISPERDAL) 1 MG tablet Take 1 mg by mouth 2 (two) times daily.     .Marland Kitchen  valACYclovir (VALTREX) 500 MG tablet Take 500 mg by mouth 2 (two) times daily as needed (flare up).   Taking    Patient Stressors: Marital or family conflict  Patient Strengths: Average or above average intelligence Supportive family/friends  Treatment Modalities: Medication Management, Group therapy, Case management,  1 to 1 session with clinician, Psychoeducation, Recreational therapy.   Physician Treatment Plan for Primary Diagnosis: Bipolar disorder ([place]) Long Term Goal(s): Improvement in symptoms so as ready for discharge  Short Term Goals: Ability to identify changes in lifestyle to reduce recurrence  of condition will improve Ability to maintain clinical measurements within normal limits will improve Compliance with prescribed medications will improve Ability to verbalize feelings will improve Ability to disclose and discuss suicidal ideas Ability to identify and develop effective coping behaviors will improve Compliance with prescribed medications will improve  Medication Management: Evaluate patient's response, side effects, and tolerance of medication regimen.  Therapeutic Interventions: 1 to 1 sessions, Unit Group sessions and Medication administration.  Evaluation of Outcomes: Progressing  Physician Treatment Plan for Secondary Diagnosis: Principal Problem:   Bipolar disorder ([place])   Long Term Goal(s): Improvement in symptoms so as ready for discharge  Short Term Goals: Ability to identify changes in lifestyle to reduce recurrence of condition will improve Ability to maintain clinical measurements within normal limits will improve Compliance with prescribed medications will improve Ability to verbalize feelings will improve Ability to disclose and discuss suicidal ideas Ability to identify and develop effective coping behaviors will improve Compliance with prescribed medications will improve  Medication Management: Evaluate patient's response, side effects, and tolerance of medication regimen.  Therapeutic Interventions: 1 to 1 sessions, Unit Group sessions and Medication administration.  Evaluation of Outcomes: Progressing   RN Treatment Plan for Primary Diagnosis: Bipolar disorder ([place]) Long Term Goal(s): Knowledge of disease and therapeutic regimen to maintain health will improve  Short Term Goals: Ability to identify and develop effective coping behaviors will improve and Compliance with prescribed medications will improve  Medication Management: RN will administer medications as ordered by provider, will assess and evaluate patient's response and provide  education to patient for prescribed medication. RN will report any adverse and/or side effects to prescribing provider.  Therapeutic Interventions: 1 on 1 counseling sessions, Psychoeducation, Medication administration, Evaluate responses to treatment, Monitor vital signs and CBGs as ordered, Perform/monitor CIWA, COWS, AIMS and Fall Risk screenings as ordered, Perform wound care treatments as ordered.  Evaluation of Outcomes: Progressing   LCSW Treatment Plan for Primary Diagnosis: Bipolar disorder ([place]) Long Term Goal(s): Safe transition to appropriate next level of care at discharge, Engage patient in therapeutic group addressing interpersonal concerns.  Short Term Goals: Engage patient in aftercare planning with referrals and resources  Therapeutic Interventions: Assess for all discharge needs, 1 to 1 time with Social worker, Explore available resources and support systems, Assess for adequacy in community support network, Educate family and significant other(s) on suicide prevention, Complete Psychosocial Assessment, Interpersonal group therapy.  Evaluation of Outcomes: Met  Return home, follow up outpt   Progress in Treatment: Attending groups: Yes Participating in groups: Yes Taking medication as prescribed: Yes Toleration medication: Yes, no side effects reported at this time Family/Significant other contact made: No Patient understands diagnosis: No  Limited insight Discussing patient identified problems/goals with staff: Yes Medical problems stabilized or resolved: Yes Denies suicidal/homicidal ideation: Yes Issues/concerns per patient self-inventory: None Other: N/A  New problem(s) identified: None identified at this time.   New Short Term/Long Term Goal(s): "I don't  want to hear voices, and I don't want to have periods where I black out."  Discharge Plan or Barriers:   Reason for Continuation of Hospitalization: Altered mental status Hallucinations Medication  stabilization   Estimated Length of Stay: 3-5 days  Attendees: Patient: Anna [name] 08/25/2017  10:24 AM  Physician: Maris Berger, MD 08/25/2017  10:24 AM  Nursing: Baldo Daub, RN 08/25/2017  10:24 AM  RN Care Manager: Lars Pinks, RN 08/25/2017  10:24 AM  Social Worker: Ripley Fraise 08/25/2017  10:24 AM  Recreational Therapist: Winfield Cunas 08/25/2017  10:24 AM  Other: Norberto Sorenson 08/25/2017  10:24 AM  Other:  08/25/2017  10:24 AM    Scribe for Treatment Team:  Roque Lias LCSW 08/25/2017 10:24 AM

## 2017-08-26 LAB — PROLACTIN: Prolactin: 43.8 ng/mL — ABNORMAL HIGH (ref 4.8–23.3)

## 2017-08-26 MED ORDER — RISPERIDONE 3 MG PO TABS
3.0000 mg | ORAL_TABLET | Freq: Every day | ORAL | Status: DC
  Administered 2017-08-26: 3 mg via ORAL
  Filled 2017-08-26 (×2): qty 1

## 2017-08-26 NOTE — Progress Notes (Signed)
Recreation Therapy Notes  INPATIENT RECREATION THERAPY ASSESSMENT  Patient Details Name: Anna [name] MRN: [id number] DOB: June 27, 1970 Today's Date: 08/26/2017       Information Obtained From: Patient  Able to Participate in Assessment/Interview: Yes  Patient Presentation: Alert, Oriented  Reason for Admission (Per Patient): Other (Comments)(Manic episode)  Patient Stressors: Work  PharmacologistCoping Skills:   Sports, TV, Music, Meditate, Deep Breathing, Talk, Prayer, Avoidance  Leisure Interests (2+):  Individual - TV, Social - Family  Frequency of Recreation/Participation: Other (Comment)(Daily)  Awareness of Community Resources:  Yes  Community Resources:  Library, [employer]  Current Use: Yes  If no, Barriers?:    Expressed Interest in [employer] Resource Information: No  [place] of Residence:  [place]  Patient Main Form of Transportation: Set designerCar  Patient Strengths:  Persistant; Compassionate  Patient Identified Areas of Improvement:  Exercise; Relaxation  Patient Goal for Hospitalization:  "Develop more breathing exercises and be able to sleep all night"  Current SI (including self-harm):  No  Current HI:  No  Current AVH: No  Staff Intervention Plan: Group Attendance, Collaborate with Interdisciplinary Treatment Team  Consent to Intern Participation: N/A   Caroll RancherMarjette Dianca Owensby, LRT/CTRS   Caroll RancherLindsay, Martice Doty A 08/26/2017, 12:41 PM

## 2017-08-26 NOTE — Progress Notes (Signed)
[facility] MD Progress Note  08/26/2017 2:37 PM Loura PardonLucia E Chaikin  MRN:  [id number] Subjective:    Anna [name] is a 47 y/o F with history of Bipolar I who was admitted from [facility] ED where she was brought in by her husband with worsening symptoms of mania including irritability, disorganized behaviors, and decreased need for sleep for 3 days. [facility] and stabilization. Pt was restarted on home medication of risperdal, and home medications of adderall and paxil were held. Dose of risperdal was titrated up during her stay. Pt has been reporting incremental improvement of her presenting symptoms.  Today upon evaluation, pt shares, "I'm doing okay, but my morning medications make me sleepy." Reviewed pt's medications and found that she is referring to AM dose of risperdal. Pt denies other specific concerns. She is sleeping well overnight. Her appetite is good. She denies other physical complaints. She denies SI/HI/AH/VH. She is tolerating her medications well overall, and she feels that they have been helpful for her. We discussed potential option of moving AM dose of risperdal to the evening in addition to her current evening dose, and pt was in agreement. She is in agreement to continue her other medications without changes. She had no further questions, comments, or concerns.  Principal Problem: Bipolar I disorder, most recent episode (or current) manic, moderate (HCC) Diagnosis:   Patient Active Problem List   Diagnosis Date Noted  . Bipolar I disorder, most recent episode (or current) manic, moderate (HCC) [F31.12] 08/23/2017  . Hyperthyroidism [E05.90] 12/15/2012  . Allergic rhinitis, cause unspecified [J30.9] 12/15/2012  . GERD (gastroesophageal reflux disease) [K21.9] 12/15/2012  . 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 Unspecified essential hypertension [I10] 12/15/2012    Total Time spent with patient: 30 minutes  Past Psychiatric History: see H&P  Past Medical History:  Past Medical History:  Diagnosis Date  . Arthritis   . Asthma    seasonal  . GERD (gastroesophageal reflux disease)    occ  . History of recurrent UTIs 1/14  . Hypertension     Past Surgical History:  Procedure Laterality Date  . ABDOMINAL HYSTERECTOMY    . ANTERIOR CERVICAL DECOMP/DISCECTOMY FUSION N/A 07/13/2012   Procedure: ANTERIOR CERVICAL DECOMPRESSION/DISCECTOMY FUSION 1 LEVEL;  Surgeon: Mariam DollarGary P Cram, MD;  Location: [facility];  Service: Neurosurgery;  Laterality: N/A;  Anterior Cervical Decompression/Discectomy Cervical Three-Four  . CARPAL TUNNEL RELEASE Right   . CERVICAL DISC SURGERY  09  . CHOLECYSTECTOMY     Family History: History reviewed. No pertinent family history. 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 Marital status: Married    Spouse name: Not on file  . Number of children: Not on file  . Years of education: Not on file  . Highest education level: Not on file  Occupational History  . Not on file  Social Needs  . Financial resource strain: Not on file  . Food insecurity:    Worry: Not on file    Inability: Not on file  . Transportation needs:    Medical: Not on file    Non-medical: Not on file  Tobacco  Use  . Smoking status: Never Smoker  . Smokeless tobacco: Never Used  Substance and Sexual Activity  . Alcohol use: No  . 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 Sexual activity: Not on file  Lifestyle  . Physical activity:    Days per week: Not on file    Minutes per session: Not on file  . Stress: Not on file  Relationships  . Social connections:    Talks on phone: Not on  file    Gets together: Not on file    Attends religious service: Not on file    Active member of club or organization: Not on file    Attends meetings of clubs or organizations: Not on file    Relationship status: Not on file  Other Topics Concern  . Not on file  Social History Narrative  . Not on file   Additional Social History:    Pain Medications: hx of prescriptio pain pill abuse, but was treated in 2016.  Denies current usage Prescriptions: denies any abuse Over the Counter: denies any abuse History of alcohol / drug use?: Yes Longest period of sobriety (when/how long): patient states that she has not used opioids in two years Negative Consequences of Use: Personal relationships Name of Substance 1: opioids 1 - Age of First Use: unknown 1 - Amount (size/oz): hx of prescription abuse 1 - Frequency: hx of daily use 1 - Duration: unknown 1 - Last Use / Amount: last use was two to three disorders                  Sleep: Good  Appetite:  Good  Current Medications: Current Facility-Administered Medications  Medication Dose Route Frequency Provider Last Rate Last Dose  . amLODipine (NORVASC) tablet 5 mg  5 mg Oral Daily Oneta Rack, NP   5 mg at 08/26/17 1191  . hydrochlorothiazide (MICROZIDE) capsule 12.5 mg  12.5 mg Oral Daily Oneta Rack, NP   12.5 mg at 08/26/17 0924  . hydrOXYzine (ATARAX/VISTARIL) tablet 50 mg  50 mg Oral Q6H PRN Micheal Likens, MD   50 mg at 08/26/17 0925  . irbesartan (AVAPRO) tablet 37.5 mg  37.5 mg Oral Daily Oneta Rack, NP   37.5 mg at 08/26/17 0923  . risperiDONE (RISPERDAL) tablet 1 mg  1 mg Oral Veatrice Kells, MD   1 mg at 08/26/17 4782   And  . risperiDONE (RISPERDAL) tablet 2 mg  2 mg Oral QHS Micheal Likens, MD   2 mg at 08/25/17 2058  . traZODone (DESYREL) tablet 50 mg  50 mg Oral QHS PRN Micheal Likens, MD   50 mg at 08/25/17 2058    Lab Results:  Results for orders  placed or performed during the hospital encounter of 08/23/17 (from the past 48 hour(s))  Lipid panel     Status: None   Collection Time: 08/25/17  6:29 AM  Result Value Ref Range   Cholesterol 160 0 - 200 mg/dL   Triglycerides 956 <213 mg/dL   HDL 43 >08 mg/dL   Total CHOL/HDL Ratio 3.7 RATIO   VLDL 25 0 - 40 mg/dL   LDL Cholesterol 92 0 - 99 mg/dL    Comment:        Total Cholesterol/HDL:CHD Risk Coronary Heart Disease Risk Table  Men   Women  1/2 Average Risk   3.4   3.3  Average Risk       5.0   4.4  2 X Average Risk   9.6   7.1  3 X Average Risk  23.4   11.0        Use the calculated Patient Ratio above and the CHD Risk Table to determine the patient's CHD Risk.        ATP III CLASSIFICATION (LDL):  <100     mg/dL   Optimal  161-096  mg/dL   Near or Above                    Optimal  130-159  mg/dL   Borderline  045-409  mg/dL   High  >811     mg/dL   Very High Performed at [facility], [address], [place], [place] [address]   Prolactin     Status: Abnormal   Collection Time: 08/25/17  6:29 AM  Result Value Ref Range   Prolactin 43.8 (H) 4.8 - 23.3 ng/mL    Comment: (NOTE) Performed At: [facility] [address] [place], [place] [id number] Jolene Schimke MD MV:[id number] Performed at [facility], [address], [place], [place] [address]   Hemoglobin A1c     Status: None   Collection Time: 08/25/17  6:29 AM  Result Value Ref Range   Hgb A1c MFr Bld 5.3 4.8 - 5.6 %    Comment: (NOTE) Pre diabetes:          5.7%-6.4% Diabetes:              >6.4% Glycemic control for   <7.0% adults with diabetes    Mean Plasma Glucose 105.41 mg/dL    Comment: Performed at [facility], [address] [place], [place] [address]  TSH     Status: None   Collection Time: 08/25/17  6:29 AM  Result Value Ref Range   TSH 0.382 0.350 - 4.500 uIU/mL    Comment: Performed by a 3rd Generation assay with a  functional sensitivity of <=0.01 uIU/mL. Performed at [facility], [address], [place], [place] [address]     Blood Alcohol level:  No results found for: [facility]  Metabolic Disorder Labs: Lab Results  Component Value Date   HGBA1C 5.3 08/25/2017   MPG 105.41 08/25/2017   Lab Results  Component Value Date   PROLACTIN 43.8 (H) 08/25/2017   Lab Results  Component Value Date   CHOL 160 08/25/2017   TRIG 126 08/25/2017   HDL 43 08/25/2017   CHOLHDL 3.7 08/25/2017   VLDL 25 08/25/2017   LDLCALC 92 08/25/2017    Physical Findings: AIMS: Facial and Oral Movements Muscles of Facial Expression: None, normal Lips and Perioral Area: None, normal Jaw: None, normal Tongue: None, normal,Extremity Movements Upper (arms, wrists, hands, fingers): None, normal Lower (legs, knees, ankles, toes): None, normal, Trunk Movements Neck, shoulders, hips: None, normal, Overall Severity Severity of abnormal movements (highest score from questions above): None, normal Incapacitation due to abnormal movements: None, normal Patient's awareness of abnormal movements (rate only patient's report): No Awareness, Dental Status Current problems with teeth and/or dentures?: No Does patient usually wear dentures?: No  CIWA:  CIWA-Ar Total: 2 COWS:  COWS Total Score: 1  Musculoskeletal: Strength & Muscle Tone: within normal limits Gait & Station: normal Patient leans: N/A  Psychiatric Specialty Exam: Physical Exam  Nursing note and vitals reviewed.   Review of Systems  Constitutional: Positive for malaise/fatigue. Negative for chills and fever.  Respiratory: Negative for cough and shortness of breath.   Cardiovascular: Negative for chest pain.  Gastrointestinal: Negative for abdominal pain, heartburn, nausea and vomiting.  Psychiatric/Behavioral: Negative for depression, hallucinations and suicidal ideas. The patient is not nervous/anxious and does not have insomnia.     Blood  pressure 126/69, pulse 87, temperature 98.5 F (36.9 C), temperature source Oral, resp. rate 20, height 5\' 4"  (1.626 m), weight 95.3 kg (210 lb), SpO2 99 %.Body mass index is 36.05 kg/m.  General Appearance: Casual and Fairly Groomed  Eye Contact:  Good  Speech:  Clear and Coherent and Normal Rate  Volume:  Normal  Mood:  Euthymic  Affect:  Appropriate and Congruent  Thought Process:  Coherent and Goal Directed  Orientation:  Full (Time, Place, and Person)  Thought Content:  Logical  Suicidal Thoughts:  No  Homicidal Thoughts:  No  Memory:  Immediate;   Fair Recent;   Fair Remote;   Fair  Judgement:  Fair  Insight:  Fair  Psychomotor Activity:  Normal  Concentration:  Concentration: Fair  Recall:  [employer] of Knowledge:  Fair  Language:  Fair  Akathisia:  No  Handed:    AIMS (if indicated):     Assets:  Communication Skills Resilience Social Support  ADL's:  Intact  Cognition:  WNL  Sleep:  Number of Hours: 6.75   Treatment Plan Summary: Daily contact with patient to assess and evaluate symptoms and progress in treatment and Medication management   -Continue inpatient hospitalization  -Bipolar I, current episode manic             -Change risperdal 1mg  po qAM + 2mg  po qhs to risperdal 3mg  po qhs  -Anxiety                        -Start vistaril 50mg  po q6h prn anxiety  -Insomnia              -Start trazodone 50mg  po qhs prn insomnia  -hypokalemia              -Completed potassium chloride po BID for total of 4 doses  -HTN             -Continue HCTZ 12.5mg  po qDay             -Continue irbesartan 37.5mg  po qDay             -Continue amlodipine 5mg  po qDay  -Encourage participation in groups and therapeutic milieu  -Disposition planning will be ongoing   Micheal Likens, MD 08/26/2017, 2:37 PM

## 2017-08-26 NOTE — [facility] Group Notes (Signed)
LCSW Group Therapy Note   08/26/2017 1:15pm   Type of Therapy and Topic:  Group Therapy:  Positive Affirmations   Participation Level:  Active  Description of Group: This group addressed positive affirmation toward self and others. Patients went around the room and identified two positive things about themselves and two positive things about a peer in the room. Patients reflected on how it felt to share something positive with others, to identify positive things about themselves, and to hear positive things from others. Patients were encouraged to have a daily reflection of positive characteristics or circumstances.  Therapeutic Goals 1. Patient will verbalize two of their positive qualities 2. Patient will demonstrate empathy for others by stating two positive qualities about a peer in the group 3. Patient will verbalize their feelings when voicing positive self affirmations and when voicing positive affirmations of others 4. Patients will discuss the potential positive impact on their wellness/recovery of focusing on positive traits of self and others. Summary of Patient Progress:  Stayed the entire time, engaged for the most part.  Stated she was tired from medication and closed her eyes for an extended period several times. "When I gave birth to my daughter, I ended up asking for help after I realized that I was not the perfect mother that I thought I was.  It was not easy to do at the time, but I have gotten better at it."  Therapeutic Modalities Cognitive Behavioral Therapy Motivational Interviewing  Anna RogueRodney B Anaira [name], [place] 08/26/2017 2:49 PM

## 2017-08-26 NOTE — Progress Notes (Signed)
Adult Psychoeducational Group Note  Date:  08/26/2017 Time:  8:50 PM  Group Topic/Focus:  Wrap-Up Group:   The focus of this group is to help patients review their daily goal of treatment and discuss progress on daily workbooks.  Participation Level:  Active  Participation Quality:  Appropriate  Affect:  Appropriate  Cognitive:  Appropriate  Insight: Appropriate  Engagement in Group:  Engaged  Modes of Intervention:  Discussion  Additional Comments:  The patient expressed that she rates today a 9.The patient also said that she attended group and her is to attend all groups.  Octavio Mannshigpen, Amberlea Spagnuolo Lee 08/26/2017, 8:50 PM

## 2017-08-26 NOTE — Progress Notes (Signed)
Recreation Therapy Notes  Date: 6.18.19 Time: 1000 Location: 500 Hall Dayroom   Group Topic: Communication, Team Building, Problem Solving  Goal Area(s) Addresses:  Patient will effectively work with peer towards shared goal.  Patient will identify skills used to make activity successful.  Patient will identify how skills used during activity can be used to reach post d/c goals.   Behavioral Response: Engaged  Intervention: STEM Activity  Activity: Stage managerLanding Pad. In teams patients were given 12 plastic drinking straws and [name] length of masking tape. Using the materials provided patients were asked to build [name] landing pad to catch [name] golf ball dropped from approximately 6 feet in the air.   Education: Pharmacist, communityocial Skills, Discharge Planning   Education Outcome: Acknowledges education/In group clarification offered/Needs additional education.   Clinical Observations/Feedback: Pt was pleasant and engaged during group. Pt took on the task of bringing the groups vision to completion.  Pt stated the group had to use communication to do the activity.  Pt expressed that when one plan doesn't work, you have to "keep trying".     Caroll RancherMarjette Madelene [name], LRT/CTRS     Caroll RancherLindsay, Anna [name] 08/26/2017 11:46 AM

## 2017-08-26 NOTE — [facility] Suicide Risk Assessment (Signed)
[facility] INPATIENT:  Family/Significant Other Suicide Prevention Education  Suicide Prevention Education:  Education Completed; No one has been identified by the patient as the family member/significant other with whom the patient will be residing, and identified as the person(s) who will aid the patient in the event of a mental health crisis (suicidal ideations/suicide attempt).  With written consent from the patient, the family member/significant other has been provided the following suicide prevention education, prior to the and/or following the discharge of the patient.  The suicide prevention education provided includes the following:  Suicide risk factors  Suicide prevention and interventions  National Suicide Hotline telephone number  [facility] assessment telephone number  [facility] Emergency Assistance 911  [facility] and/or Residential Mobile Crisis Unit telephone number  Request made of family/significant other to:  Remove weapons (e.g., guns, rifles, knives), all items previously/currently identified as safety concern.    Remove drugs/medications (over-the-counter, prescriptions, illicit drugs), all items previously/currently identified as a safety concern.  The family member/significant other verbalizes understanding of the suicide prevention education information provided.  The family member/significant other agrees to remove the items of safety concern listed above. The patient did not endorse SI at the time of admission, nor did the patient c/o SI during the stay here.  SPE not required.  Anna [name] 08/26/2017, 2:56 PM

## 2017-08-26 NOTE — Progress Notes (Signed)
D: Pt was in the dayroom upon initial approach.  She presents with appropriate affect and mood.  Her goal was to "attend all the groups and I did."  She reports the best part of her day was her visits with her mother, father, husband, and daughter.  Pt denies SI/HI, hallucinations, and pain.  She has been visible in milieu interacting with others appropriately.  A: Met with pt 1:1 and provided support and encouragement.  Medication administered per order.  Medication education provided.  PRN medication administered for sleep.  Q15 minute safety checks maintained.  R: Pt is compliant with medications.  She verbally contracts for safety and reports she will inform staff of needs and concerns.  Will continue to monitor and assess.

## 2017-08-26 NOTE — Plan of Care (Signed)
  Problem: Activity: Goal: Sleeping patterns will improve Outcome: Progressing Note:  Pt slept 6.75 hours last night.   

## 2017-08-26 NOTE — Plan of Care (Signed)
  Problem: Safety: Goal: Periods of time without injury will increase Outcome: Progressing   Problem: Health Behavior/Discharge Planning: Goal: Compliance with therapeutic regimen will improve Outcome: Progressing   Problem: Self-Concept: Goal: Level of anxiety will decrease Outcome: Progressing  DAR NOTE: Patient presents with anxious affect and depressed mood.  Denies pain, auditory and visual hallucinations.  Rates depression at 3, hopelessness at 2, and anxiety at 5.  Maintained on routine safety checks.  Medications given as prescribed.  Support and encouragement offered as needed.  Attended group and participated.  States goal for today is "relaxation."  Patient visible in milieu with minimal interaction.    Patient requested and received Vistaril for anxiety with good effect.

## 2017-08-26 NOTE — Progress Notes (Signed)
Adult Psychoeducational Group Note  Date:  08/26/2017 Time:  12:51 AM  Group Topic/Focus:  Wrap-Up Group:   The focus of this group is to help patients review their daily goal of treatment and discuss progress on daily workbooks.  Participation Level:  Active  Participation Quality:  Appropriate  Affect:  Appropriate  Cognitive:  Appropriate  Insight: Appropriate  Engagement in Group:  Engaged  Modes of Intervention:  Discussion  Additional Comments:  Pt stated her goal for today was to get some rest. Pt stated she was able to accomplished her goal today. Pt rated her over all day a 10. Pt stated she attend all groups held today. Pt stated having her family come for visitation help improve her day.  Felipa FurnaceChristopher  Kierstin January 08/26/2017, 12:51 AM

## 2017-08-27 MED ORDER — TRAZODONE HCL 50 MG PO TABS: 50.0000 mg | ORAL_TABLET | Freq: Every evening | ORAL | 0 refills | Status: DC | PRN

## 2017-08-27 MED ORDER — RISPERIDONE 3 MG PO TABS: 3.0000 mg | ORAL_TABLET | Freq: Every day | ORAL | 0 refills | Status: DC

## 2017-08-27 MED ORDER — AMLODIPINE BESYLATE 5 MG PO TABS: 5.0000 mg | ORAL_TABLET | Freq: Every day | ORAL | Status: DC

## 2017-08-27 MED ORDER — HYDROXYZINE HCL 50 MG PO TABS: 50.0000 mg | ORAL_TABLET | Freq: Four times a day (QID) | ORAL | 0 refills | Status: DC | PRN

## 2017-08-27 MED ORDER — HYDROCHLOROTHIAZIDE 12.5 MG PO CAPS: 12.5000 mg | ORAL_CAPSULE | Freq: Every day | ORAL | Status: DC

## 2017-08-27 MED ORDER — IRBESARTAN 75 MG PO TABS: 37.5000 mg | ORAL_TABLET | Freq: Every day | ORAL | Status: DC

## 2017-08-27 NOTE — Progress Notes (Signed)
Recreation Therapy Notes  6.19.19  1105:  LRT met with patient and gave her a packet on stress management.  LRT went over the techniques with patient.  Patient was receptive and grateful for the information.    Victorino Sparrow, LRT/CTRS    Ria Comment, Kevonte Vanecek A 08/27/2017 12:12 PM

## 2017-08-27 NOTE — Progress Notes (Signed)
Recreation Therapy Notes  INPATIENT RECREATION THERAPY ASSESSMENT  Patient Details Name: Anna [name] MRN: [id number] DOB: Sep 04, 1970 Today's Date: 08/27/2017       Information Obtained From: Patient  Able to Participate in Assessment/Interview: Yes  Patient Presentation: Alert, Oriented  Reason for Admission (Per Patient): Other (Comments)(Manic episode)  Patient Stressors: Work  PharmacologistCoping Skills:   Sports, TV, Music, Meditate, Deep Breathing, Talk, Prayer, Avoidance  Leisure Interests (2+):  Individual - TV, Social - Family  Frequency of Recreation/Participation: Other (Comment)(Daily)  Awareness of Community Resources:  Yes  Community Resources:  Library, [employer]  Current Use: Yes  If no, Barriers?:    Expressed Interest in [employer] CorporationCommunity Resource Information: No  [place] of Residence:  [place]  Patient Main Form of Transportation: Set designerCar  Patient Strengths:  Persistant; Compassionate  Patient Identified Areas of Improvement:  Exercise; Relaxation  Patient Goal for Hospitalization:  "Develop more breathing exercises and be able to sleep all night"  Current SI (including self-harm):  No  Current HI:  No  Current AVH: No  Staff Intervention Plan: Group Attendance, Collaborate with Interdisciplinary Treatment Team  Consent to Intern Participation: N/A    Caroll RancherMarjette Claudia Alvizo, LRT/CTRS   Caroll RancherLindsay, Jahden Schara A 08/27/2017, 12:08 PM

## 2017-08-27 NOTE — Discharge Summary (Addendum)
Physician Discharge Summary Note  Patient:  Anna [name] is an 47 y.o., female  MRN:  [id number]  DOB:  1970-04-03  Patient phone:  [phone] (home)   Patient address:   [address] [place] [address],   Total Time spent with patient: Greater than 30 minutes  Date of Admission:  08/23/2017  Date of Discharge: 08-27-17  Reason for Admission: Worsening symptoms of mania including irritability, disorganized behaviors, and decreased need for sleep for 3 days.   Principal Problem: Bipolar I disorder, most recent episode (or current) manic, moderate (HCC)  Discharge Diagnoses: Patient Active Problem List   Diagnosis Date Noted  . Bipolar I disorder, most recent episode (or current) manic, moderate (HCC) [F31.12] 08/23/2017  . Hyperthyroidism [E05.90] 12/15/2012  . Allergic rhinitis, cause unspecified [J30.9] 12/15/2012  . GERD (gastroesophageal reflux disease) [K21.9] 12/15/2012  . 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 Unspecified essential hypertension [I10] 12/15/2012   Past Psychiatric History: Bipolar 1 disorder  Past Medical History:  Past Medical History:  Diagnosis Date  . Arthritis   . Asthma    seasonal  . GERD (gastroesophageal reflux disease)    occ  . History of recurrent UTIs 1/14  . Hypertension     Past Surgical History:  Procedure Laterality Date  . ABDOMINAL HYSTERECTOMY    . ANTERIOR CERVICAL DECOMP/DISCECTOMY FUSION N/A 07/13/2012   Procedure: ANTERIOR CERVICAL DECOMPRESSION/DISCECTOMY FUSION 1 LEVEL;  Surgeon: Mariam Dollar, MD;  Location: [facility];  Service: Neurosurgery;  Laterality: N/A;  Anterior Cervical Decompression/Discectomy Cervical Three-Four  . CARPAL TUNNEL RELEASE Right   . CERVICAL DISC SURGERY  09  . CHOLECYSTECTOMY     Family History: History reviewed. No pertinent family history.  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 Marital status: Married    Spouse name: Not on file  . Number of children: Not on file  . Years of education: Not on file  . Highest education level: Not on file  Occupational History  . Not on file  Social Needs  . Financial resource strain: Not on file  . Food insecurity:    Worry: Not on file    Inability: Not on file  . Transportation needs:    Medical: Not on file    Non-medical: Not on file  Tobacco Use  . Smoking status: Never Smoker  . Smokeless tobacco: Never Used  Substance and Sexual Activity  . Alcohol use: No  . 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 Sexual activity: Not on file  Lifestyle  . Physical activity:    Days per week: Not on file    Minutes per session: Not on file  . Stress: Not on file  Relationships  . Social connections:    Talks on phone: Not on file    Gets together: Not on file    Attends religious service: Not on file    Active member of club or organization: Not on file    Attends meetings of clubs or organizations: Not on file    Relationship status: Not on file  Other Topics  Concern  . Not on file  Social History Narrative  . Not on file   Hospital Course: (Per Md's discharge SRA): Anna [name] is a 47 y/o F with history of Bipolar I who was [facility] where she was brought in by her husband with worsening symptoms of mania including irritability, disorganized behaviors, and decreased need for sleep for 3 days. [facility] and stabilization. Pt was restarted on home medication of risperdal, and home medications of adderall and paxil were held.Dose of risperdal was titrated up during her stay. Pt has been reporting improvement of  her presenting symptoms.  Today upon evaluation, pt shares, "I'm doing good. Switching the risperdal to bedtime really helped." Pt denies any specific concerns. She is sleeping well. Her appetite is good. She denies other physical complaints. She denies SI/HI/AH/VH. She is tolerating her medications well, and she is in agreement to continue her current regimen without changes. She plans to follow up with Dr. Bayard MalesGullapalli after discharge. She was able to engage in safety planning including [facility] or contact emergency services if she feels unable to maintain her own safety or the safety of others. Pt had no further questions, comments, or concerns.  Plan Of Care/Follow-up recommendations:   -Discharge to outpatient level of care  -Bipolar I, current episode manic -Continue risperdal 3mg  po qhs  -Anxiety -Continue vistaril 50mg  po q6h prn anxiety  -Insomnia -Continue trazodone 50mg  po qhs prn insomnia  -HTN -Continue HCTZ 12.5mg  po qDay -Continue irbesartan 37.5mg  po qDay -Continue amlodipine 5mg  po qDay  Activity:  as tolerated Diet:  normal Tests:  NA Other:  see above for DC plan  Physical Findings: AIMS: Facial and Oral Movements Muscles of Facial Expression: None, normal Lips and Perioral Area: None, normal Jaw: None, normal Tongue: None, normal,Extremity Movements Upper (arms, wrists, hands, fingers): None, normal Lower (legs, knees, ankles, toes): None, normal, Trunk Movements Neck, shoulders, hips: None, normal, Overall Severity Severity of abnormal movements (highest score from questions above): None, normal Incapacitation due to abnormal movements: None, normal Patient's awareness of abnormal movements (rate only patient's report): No Awareness, Dental Status Current problems with teeth and/or dentures?: No Does patient usually wear dentures?: No  CIWA:  CIWA-Ar  Total: 2 COWS:  COWS Total Score: 1  Musculoskeletal: Strength & Muscle Tone: within normal limits Gait & Station: normal Patient leans: N/A  Psychiatric Specialty Exam: Physical Exam  Constitutional: She appears well-developed.  HENT:  Head: Normocephalic.  Eyes: Pupils are equal, round, and reactive to light.  Neck: Normal range of motion.  Cardiovascular: Normal rate.  Respiratory: Effort normal.  GI: Soft.  Genitourinary:  Genitourinary Comments: Deferred  Musculoskeletal: Normal range of motion.  Neurological: She is alert.  Skin: Skin is warm.    Review of Systems  Constitutional: Negative.   HENT: Negative.   Eyes: Negative.   Respiratory: Negative.   Cardiovascular: Negative.   Gastrointestinal: Negative.   Genitourinary: Negative.   Musculoskeletal: Negative.   Skin: Negative.   Neurological: Negative.   Endo/Heme/Allergies: Negative.   Psychiatric/Behavioral: Positive for depression (stabilized with medication prior to discharge) and hallucinations (Hx. Psychosis (Stabilized with medication prior to discharge)). Negative for memory loss, substance abuse and suicidal ideas. The patient has insomnia (Stabilized with medication prior to discharge). The patient is not nervous/anxious.     Blood pressure 107/79, pulse 99, temperature 98.5 F (36.9 C), temperature source Oral, resp. rate 20, height 5\' 4"  (1.626  m), weight 95.3 kg (210 lb), SpO2 99 %.Body mass index is 36.05 kg/m.  See Md's SRA   Have you used any form of tobacco in the last 30 days? (Cigarettes, Smokeless Tobacco, Cigars, and/or Pipes): Patient Refused Screening  Has this patient used any form of tobacco in the last 30 days? (Cigarettes, Smokeless Tobacco, Cigars, and/or Pipes): N/A  Blood Alcohol level:  No results found for: [facility]  Metabolic Disorder Labs:  Lab Results  Component Value Date   HGBA1C 5.3 08/25/2017   MPG 105.41 08/25/2017   Lab Results  Component Value Date   PROLACTIN 43.8  (H) 08/25/2017   Lab Results  Component Value Date   CHOL 160 08/25/2017   TRIG 126 08/25/2017   HDL 43 08/25/2017   CHOLHDL 3.7 08/25/2017   VLDL 25 08/25/2017   LDLCALC 92 08/25/2017   See Psychiatric Specialty Exam and Suicide Risk Assessment completed by Attending Physician prior to discharge.  Discharge destination:  Home  Is patient on multiple antipsychotic therapies at discharge:  No   Has Patient had three or more failed trials of antipsychotic monotherapy by history:  No  Recommended Plan for Multiple Antipsychotic Therapies: NA  Allergies as of 08/27/2017      Reactions   Avelox [moxifloxacin Hcl In Nacl] Anaphylaxis   Lactose Intolerance (gi) Diarrhea   Tape Other (See Comments)   If tape on a while will pull off skin      Medication List    STOP taking these medications   amphetamine-dextroamphetamine 20 MG tablet Commonly known as:  ADDERALL   amphetamine-dextroamphetamine 30 MG 24 hr capsule Commonly known as:  ADDERALL XR   azelastine 0.1 % nasal spray Commonly known as:  ASTELIN   COLD & ALLERGY PO   cyclobenzaprine 10 MG tablet Commonly known as:  FLEXERIL   LORazepam 0.5 MG tablet Commonly known as:  ATIVAN   methimazole 5 MG tablet Commonly known as:  TAPAZOLE   methocarbamol 500 MG tablet Commonly known as:  ROBAXIN   multivitamin with minerals Tabs tablet   oxyCODONE 5 MG immediate release tablet Commonly known as:  ROXICODONE   oxyCODONE-acetaminophen 5-325 MG tablet Commonly known as:  PERCOCET/ROXICET   PARoxetine 37.5 MG 24 hr tablet Commonly known as:  PAXIL-CR   pravastatin 20 MG tablet Commonly known as:  PRAVACHOL   TRIBENZOR 20-5-12.5 MG Tabs Generic drug:  Olmesartan-amLODIPine-HCTZ   valACYclovir 500 MG tablet Commonly known as:  VALTREX     TAKE these medications     Indication  amLODipine 5 MG tablet Commonly known as:  NORVASC Take 1 tablet (5 mg total) by mouth daily. For high blood pressure Start  taking on:  08/28/2017  Indication:  High Blood Pressure Disorder   hydrochlorothiazide 12.5 MG capsule Commonly known as:  MICROZIDE Take 1 capsule (12.5 mg total) by mouth daily. For high blood pressure Start taking on:  08/28/2017  Indication:  High Blood Pressure Disorder   hydrOXYzine 50 MG tablet Commonly known as:  ATARAX/VISTARIL Take 1 tablet (50 mg total) by mouth every 6 (six) hours as needed for anxiety.  Indication:  Feeling Anxious   irbesartan 75 MG tablet Commonly known as:  AVAPRO Take 0.5 tablets (37.5 mg total) by mouth daily. For high blood pressure Start taking on:  08/28/2017  Indication:  High Blood Pressure Disorder   risperiDONE 3 MG tablet Commonly known as:  RISPERDAL Take 1 tablet (3 mg total) by mouth at bedtime. For mood control What  changed:    medication strength  how much to take  when to take this  additional instructions  Indication:  Mood control   traZODone 50 MG tablet Commonly known as:  DESYREL Take 1 tablet (50 mg total) by mouth at bedtime as needed for sleep.  Indication:  Trouble Sleeping      Follow-up Information    Elvina Sidle, MD Follow up on 09/29/2017.   Specialty:  Psychiatry Why:  Monday at 10:30 with Dr Lenice Pressman for your hospital follow up appointment. Bring your insurance card and your hospital d/c paperwork Contact information: [address] [place] [address] [phone]          Follow-up recommendations: Activity:  As tolerated Diet: As recommended by your primary care doctor. Keep all scheduled follow-up appointments as recommended.   Comments: Patient is instructed prior to discharge to: Take all medications as prescribed by his/her mental healthcare provider. Report any adverse effects and or reactions from the medicines to his/her outpatient provider promptly. Patient has been instructed & cautioned: To not engage in alcohol and or illegal drug use while on prescription medicines. In  the event of worsening symptoms, patient is instructed to call the crisis hotline, 911 and or go to the nearest ED for appropriate evaluation and treatment of symptoms. To follow-up with his/her primary care provider for your other medical issues, concerns and or health care needs.   Signed: Armandina Stammer, NP, PMHNP, FNP-BC 08/27/2017, 9:53 AM   Patient seen, Suicide Assessment Completed.  Disposition Plan Reviewed

## 2017-08-27 NOTE — Progress Notes (Signed)
  [facility] Adult [facility] Discharge Plan :  Will you be returning to the same living situation after discharge:  Yes,  home At discharge, do you have transportation home?: Yes,  family Do you have the ability to pay for your medications: Yes,  insurance  Release of information consent forms completed and in the chart;  Patient's signature needed at discharge.  Patient to Follow up at: Follow-up Information    Elvina SidleGullapalli, Swaruparani, MD Follow up on 09/29/2017.   Specialty:  Psychiatry Why:  Monday at 10:30 with Dr Lenice PressmanGullipalli for your hospital follow up appointment. Bring your insurance card and your hospital d/c paperwork Contact information: [address] [place] [address] [phone]           Next level of care provider has access to [facility] Link:no  Safety Planning and Suicide Prevention discussed: Yes,  yes  Have you used any form of tobacco in the last 30 days? (Cigarettes, Smokeless Tobacco, Cigars, and/or Pipes): Patient Refused Screening  Has patient been referred to the Quitline?: N/A patient is not a smoker  Patient has been referred for addiction treatment: N/A  Ida RogueRodney B Jayro Mcmath, LCSW 08/27/2017, 3:27 PM

## 2017-08-27 NOTE — Progress Notes (Signed)
Recreation Therapy Notes  INPATIENT RECREATION TR PLAN  Patient Details Name: Anna [name] MRN: [id number] DOB: [birth year] Today's Date: 08/27/2017  Rec Therapy Plan Is patient appropriate for Therapeutic Recreation?: Yes Treatment times per week: about 3 days Estimated Length of Stay: 5-7 days TR Treatment/Interventions: Group participation (Comment)  Discharge Criteria Pt will be discharged from therapy if:: Discharged Treatment plan/goals/alternatives discussed and agreed upon by:: Patient/family  Discharge Summary Short term goals set: See pt care plan Short term goals met: Complete Progress toward goals comments: Groups attended Which groups?: Goal setting, Communication Reason goals not met: None Therapeutic equipment acquired: N/A Reason patient discharged from therapy: Discharge from hospital Pt/family agrees with progress & goals achieved: Yes Date patient discharged from therapy: 08/27/17    Victorino Sparrow, LRT/CTRS  Ria Comment, Sebastyan Snodgrass A 08/27/2017, 12:11 PM

## 2017-08-27 NOTE — Progress Notes (Signed)
Recreation Therapy Notes  Date: 6.19.19 Time: 1000 Location: 500 Hall Dayroom  Group Topic: Leisure Education, Goal Setting  Goal Area(s) Addresses:  Patient will be able to identify at least 3 life goals.  Patient will be able to identify benefit of investing in life goals.  Patient will be able to identify benefit of setting life goals.   Behavioral Response:  Engaged  Intervention: Worksheet  Activity: Setting Life Goals.  Patients were to identify what they were doing well, what they needed to improve and set a goal in the areas of family, friends, spirituality, work/school, mental health and body.  Patients would then share their top 4 categories with the group.  Education:  Discharge Planning, PharmacologistCoping Skills, Leisure Education   Education Outcome: Acknowledges Education/In Group Clarification Provided/Needs Additional Education  Clinical Observations: Pt stated "goals give you something to work towards".  Pt identified her top categories as follows: family- good at loving them- needs to improve letting them help her more and set a goal of "ask for help more often"; spirituality- good at attending church- needs to improve prayer life- set goal of "pray for family on a regular basis in one month"; body- takes medications- improve exercise - set goal of walking 3 times a week within a month; mental health- go to church- improve breathing exercises- set goal of doing breathing exercises once a day by the end of the month; and work/school- hard worker- establish more work friends - "get to know 2 more people by the end of the first semester when school starts back.      Caroll RancherMarjette Eulalah Rupert, LRT/CTRS     Caroll RancherLindsay, Eural Holzschuh A 08/27/2017 12:19 PM

## 2017-08-27 NOTE — Plan of Care (Signed)
Pt was given a packet that went over various stress management techniques.  LRT went over techniques with pt.   Caroll RancherMarjette Yerachmiel Spinney, LRT/CTRS

## 2017-08-27 NOTE — Progress Notes (Signed)
Patient ID: Anna [name], female   DOB: 11-10-1970, 47 y.o.   MRN: [id number] Patient discharged to home/self care.  Patient denies SI, HI and AVH upon discharge.  Patient also reported a reduction in symptoms that led to admission.  Patient acknowledges understanding of all discharge instructions and receipt of all personal belongings.  Patient left in the company of husband.

## 2017-08-27 NOTE — [facility] Suicide Risk Assessment (Signed)
[facility]   Principal Problem: Bipolar I disorder, most recent episode (or current) manic, moderate (HCC) Discharge Diagnoses:  Patient Active Problem List   Diagnosis Date Noted  . Bipolar I disorder, most recent episode (or current) manic, moderate (HCC) [F31.12] 08/23/2017  . Hyperthyroidism [E05.90] 12/15/2012  . Allergic rhinitis, cause unspecified [J30.9] 12/15/2012  . GERD (gastroesophageal reflux disease) [K21.9] 12/15/2012  . 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 Unspecified essential hypertension [I10] 12/15/2012    Total Time spent with patient: 30 minutes  Musculoskeletal: Strength & Muscle Tone: within normal limits Gait & Station: normal Patient leans: N/A  Psychiatric Specialty Exam: Review of Systems  Constitutional: Negative for chills and fever.  Respiratory: Negative for cough and shortness of breath.   Cardiovascular: Negative for chest pain.  Gastrointestinal: Negative for abdominal pain, heartburn, nausea and vomiting.  Psychiatric/Behavioral: Negative for depression, hallucinations and suicidal ideas. The patient is not nervous/anxious and does not have insomnia.     Blood pressure 107/79, pulse 99, temperature 98.5 F (36.9 C), temperature source Oral, resp. rate 20, height 5\' 4"  (1.626 m), weight 95.3 kg (210 lb), SpO2 99 %.Body mass index is 36.05 kg/m.  General Appearance: Casual and Fairly Groomed  Patent attorneyye Contact::  Good  Speech:  Clear and Coherent and Normal Rate  Volume:  Normal  Mood:  Euthymic  Affect:  Appropriate, Congruent and Full Range  Thought Process:  Coherent and Goal Directed  Orientation:  Full (Time, Place, and Person)  Thought Content:  Logical  Suicidal Thoughts:  No  Homicidal Thoughts:  No  Memory:  Immediate;   Fair Recent;   Fair Remote;   Fair  Judgement:  Fair  Insight:  Fair  Psychomotor Activity:   Normal  Concentration:  Fair  Recall:  FiservFair  Fund of Knowledge:Fair  Language: Fair  Akathisia:  No  Handed:    AIMS (if indicated):     Assets:  Communication Skills Resilience Social Support  Sleep:  Number of Hours: 6.75  Cognition: WNL  ADL's:  Intact   Mental Status Per Nursing Assessment::   On Admission:  NA  Demographic Factors:  Caucasian  Loss Factors: NA  Historical Factors: NA  Risk Reduction Factors:   Sense of responsibility to family, Living with another person, especially a relative, Positive social support, Positive therapeutic relationship and Positive coping skills or problem solving skills  Continued Clinical Symptoms:  Bipolar Disorder:   Mixed State  Cognitive Features That Contribute To Risk:  None    Suicide Risk:  Minimal: No identifiable suicidal ideation.  Patients presenting with no risk factors but with morbid ruminations; may be classified as minimal risk based on the severity of the depressive symptoms  Follow-up Information    Elvina SidleGullapalli, Swaruparani, MD Follow up on 09/29/2017.   Specialty:  Psychiatry Why:  Monday at 10:30 with Dr Lenice PressmanGullipalli for your hospital follow up appointment. Bring your insurance card and your hospital d/c paperwork Contact information: [address] [place] [address] [phone]         Subjective Data:  Anna [name] is a 47 y/o F with history of Bipolar I who was [facility] where she was brought in by her husband with worsening symptoms of mania including irritability, disorganized behaviors, and decreased need for sleep for 3 days. [facility] and stabilization. Pt was restarted on home  medication of risperdal, and home medications of adderall and paxil were held. Dose of risperdal was titrated up during her stay. Pt has been reporting improvement of her presenting symptoms.  Today upon evaluation, pt shares, "I'm doing good. Switching the risperdal  to bedtime really helped." Pt denies any specific concerns. She is sleeping well. Her appetite is good. She denies other physical complaints. She denies SI/HI/AH/VH. She is tolerating her medications well, and she is in agreement to continue her current regimen without changes. She plans to follow up with Dr. Bayard Males after discharge. She was able to engage in safety planning including plan to return to [facility] or contact emergency services if she feels unable to maintain her own safety or the safety of others. Pt had no further questions, comments, or concerns.   Plan Of Care/Follow-up recommendations:   -Discharge to outpatient level of care  -Bipolar I, current episode manic -Continue risperdal 3mg  po qhs  -Anxiety -Continue vistaril 50mg  po q6h prn anxiety  -Insomnia -Continue trazodone 50mg  po qhs prn insomnia  -HTN -Continue HCTZ 12.5mg  po qDay -Continue irbesartan 37.5mg  po qDay -Continue amlodipine 5mg  po qDay  Activity:  as tolerated Diet:  normal Tests:  NA Other:  see above for DC plan  Micheal Likens, MD 08/27/2017, 8:20 AM

## 2019-02-18 LAB — HM MAMMOGRAPHY

## 2019-04-16 ENCOUNTER — Other Ambulatory Visit: Payer: Self-pay | Admitting: Family Medicine

## 2019-05-03 ENCOUNTER — Other Ambulatory Visit: Payer: Self-pay

## 2019-05-03 MED ORDER — AMPHETAMINE-DEXTROAMPHET ER 30 MG PO CP24: 30.0000 mg | ORAL_CAPSULE | Freq: Every day | ORAL | 0 refills | Status: DC

## 2019-05-03 MED ORDER — AMPHETAMINE-DEXTROAMPHETAMINE 20 MG PO TABS: 20.0000 mg | ORAL_TABLET | Freq: Every day | ORAL | 0 refills | Status: DC

## 2019-05-05 ENCOUNTER — Other Ambulatory Visit: Payer: Self-pay | Admitting: Family Medicine

## 2019-05-20 ENCOUNTER — Other Ambulatory Visit: Payer: Self-pay | Admitting: Family Medicine

## 2019-05-23 ENCOUNTER — Other Ambulatory Visit: Payer: Self-pay | Admitting: Family Medicine

## 2019-06-02 ENCOUNTER — Other Ambulatory Visit: Payer: Self-pay

## 2019-06-04 MED ORDER — AMPHETAMINE-DEXTROAMPHETAMINE 20 MG PO TABS: 20.0000 mg | ORAL_TABLET | Freq: Every day | ORAL | 0 refills | Status: DC

## 2019-06-04 MED ORDER — AMPHETAMINE-DEXTROAMPHET ER 30 MG PO CP24: 30.0000 mg | ORAL_CAPSULE | Freq: Every day | ORAL | 0 refills | Status: DC

## 2019-06-29 ENCOUNTER — Other Ambulatory Visit: Payer: Self-pay

## 2019-06-29 ENCOUNTER — Other Ambulatory Visit: Payer: Self-pay | Admitting: Family Medicine

## 2019-06-29 MED ORDER — AMPHETAMINE-DEXTROAMPHETAMINE 20 MG PO TABS: 20.0000 mg | ORAL_TABLET | Freq: Every day | ORAL | 0 refills | Status: DC

## 2019-06-29 MED ORDER — AMPHETAMINE-DEXTROAMPHET ER 30 MG PO CP24: 30.0000 mg | ORAL_CAPSULE | Freq: Every day | ORAL | 0 refills | Status: DC

## 2019-07-25 ENCOUNTER — Other Ambulatory Visit: Payer: Self-pay | Admitting: Family Medicine

## 2019-07-27 ENCOUNTER — Other Ambulatory Visit: Payer: Self-pay

## 2019-07-27 MED ORDER — AMPHETAMINE-DEXTROAMPHET ER 30 MG PO CP24: 30.0000 mg | ORAL_CAPSULE | Freq: Every day | ORAL | 0 refills | Status: DC

## 2019-07-27 MED ORDER — AMPHETAMINE-DEXTROAMPHETAMINE 20 MG PO TABS: 20.0000 mg | ORAL_TABLET | Freq: Every day | ORAL | 0 refills | Status: DC

## 2019-08-23 ENCOUNTER — Other Ambulatory Visit: Payer: Self-pay

## 2019-08-24 MED ORDER — AMPHETAMINE-DEXTROAMPHET ER 30 MG PO CP24: 30.0000 mg | ORAL_CAPSULE | Freq: Every day | ORAL | 0 refills | Status: DC

## 2019-08-24 MED ORDER — AMPHETAMINE-DEXTROAMPHETAMINE 20 MG PO TABS: 20.0000 mg | ORAL_TABLET | Freq: Every day | ORAL | 0 refills | Status: DC

## 2019-08-30 ENCOUNTER — Other Ambulatory Visit: Payer: Self-pay | Admitting: Family Medicine

## 2019-09-01 ENCOUNTER — Ambulatory Visit: Payer: BC Managed Care – PPO | Admitting: Family Medicine

## 2019-09-28 ENCOUNTER — Other Ambulatory Visit: Payer: Self-pay

## 2019-09-28 MED ORDER — AMPHETAMINE-DEXTROAMPHETAMINE 20 MG PO TABS: 20.0000 mg | ORAL_TABLET | Freq: Every day | ORAL | 0 refills | Status: DC

## 2019-09-28 MED ORDER — AMPHETAMINE-DEXTROAMPHET ER 30 MG PO CP24: 30.0000 mg | ORAL_CAPSULE | Freq: Every day | ORAL | 0 refills | Status: DC

## 2019-10-04 ENCOUNTER — Encounter: Payer: Self-pay | Admitting: Family Medicine

## 2019-10-04 ENCOUNTER — Ambulatory Visit: Payer: BC Managed Care – PPO | Admitting: Family Medicine

## 2019-10-04 ENCOUNTER — Other Ambulatory Visit: Payer: Self-pay

## 2019-10-04 VITALS — BP 136/84 | HR 104 | Temp 98.1°F | Resp 18 | Ht 63.0 in | Wt 232.0 lb

## 2019-10-04 DIAGNOSIS — E782 Mixed hyperlipidemia: Secondary | ICD-10-CM | POA: Diagnosis not present

## 2019-10-04 DIAGNOSIS — K219 Gastro-esophageal reflux disease without esophagitis: Secondary | ICD-10-CM | POA: Diagnosis not present

## 2019-10-04 DIAGNOSIS — Z1211 Encounter for screening for malignant neoplasm of colon: Secondary | ICD-10-CM

## 2019-10-04 DIAGNOSIS — I10 Essential (primary) hypertension: Secondary | ICD-10-CM

## 2019-10-04 DIAGNOSIS — F3161 Bipolar disorder, current episode mixed, mild: Secondary | ICD-10-CM

## 2019-10-04 NOTE — Progress Notes (Signed)
Established Patient Office Visit  Subjective:  Patient ID: Anna [name], female    DOB: [birth year]  Age: 49 y.o. MRN: [id number]  CC:  Chief Complaint  Patient presents with  . Hypertension   HPI Anna [name] presents for follow up hypertension, ADHD, and Bipolar disorder.  Pt presents for follow up of hypertension. She is currently on olmesartan/amlodipine/hctz. The patient is tolerating the medication well without side effects. Compliance with treatment has been good; including taking medication as directed , maintains a healthy diet and regular exercise regimen , and following up as directed.  Bipolar disorder: Doing great on paxil cr, vraylar, xanax, and atarax.  ADHD: She is tolerating adderal. Her attention is well controlled.   Hyperlipidemia: Taking pravastatin. Tolerating well. Eating healthy and exercise.   Past Medical History:  Diagnosis Date  . Arthritis   . Asthma    seasonal  . GERD (gastroesophageal reflux disease)    occ  . History of recurrent UTIs 1/14  . Hypertension     Past Surgical History:  Procedure Laterality Date  . ABDOMINAL HYSTERECTOMY    . ANTERIOR CERVICAL DECOMP/DISCECTOMY FUSION N/A 07/13/2012   Procedure: ANTERIOR CERVICAL DECOMPRESSION/DISCECTOMY FUSION 1 LEVEL;  Surgeon: Mariam Dollar, MD;  Location: [facility];  Service: Neurosurgery;  Laterality: N/A;  Anterior Cervical Decompression/Discectomy Cervical Three-Four  . CARPAL TUNNEL RELEASE Right   . CERVICAL DISC SURGERY  09  . CHOLECYSTECTOMY      No family history on file.  Social History   Socioeconomic History  . Marital status: Married    Spouse name: Not on file  . Number of children: Not on file  . Years of education: Not on file  . Highest education level: Not on file  Occupational History  . Not on file  Tobacco Use  . Smoking status: Never Smoker  . Smokeless tobacco: Never Used  Vaping Use  . Vaping Use: Never used  Substance and Sexual Activity  . Alcohol use:  No  . 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 Sexual activity: Not on file  Other Topics Concern  . Not on file  Social History Narrative  . Not on file   Social Determinants of Health   Financial Resource Strain:   . Difficulty of Paying Living Expenses:   Food Insecurity:   . Worried About Programme researcher, broadcasting/film/video in the Last Year:   . Barista in the Last Year:   Transportation Needs:   . Freight forwarder (Medical):   Marland Kitchen Lack of Transportation (Non-Medical):   Physical Activity:   . Days of Exercise per Week:   . Minutes of Exercise per Session:   Stress:   . Feeling of Stress :   Social Connections:   . Frequency of Communication with Friends and Family:   . Frequency of Social Gatherings with Friends and Family:   . Attends Religious Services:   . Active Member of Clubs or Organizations:   . Attends Banker Meetings:   Marland Kitchen Marital Status:   Intimate Partner Violence:   . Fear of Current or Ex-Partner:   . Emotionally Abused:   Marland Kitchen Physically Abused:   . Sexually Abused:     Outpatient Medications Prior to Visit  Medication Sig Dispense Refill  . ALPRAZolam (XANAX) 0.5 MG tablet TAKE 1 TABLET BY MOUTH DAILY AS NEEDED FOR SEVERE ANXIETY 30 tablet 3  . amphetamine-dextroamphetamine (ADDERALL XR)  30 MG 24 hr capsule Take 1 capsule (30 mg total) by mouth daily. 30 capsule 0  . amphetamine-dextroamphetamine (ADDERALL) 20 MG tablet Take 1 tablet (20 mg total) by mouth daily. 30 tablet 0  . hydrOXYzine (ATARAX/VISTARIL) 50 MG tablet Take 1 tablet (50 mg total) by mouth every 6 (six) hours as needed for anxiety. 60 tablet 0  . Olmesartan-amLODIPine-HCTZ 20-5-12.5 MG TABS TAKE 1 TABLET BY MOUTH EVERY DAY 90 tablet 1  . PARoxetine (PAXIL-CR) 37.5 MG 24 hr tablet TAKE 1 TABLET BY MOUTH EVERY DAY 90 tablet 1  . pravastatin (PRAVACHOL) 40 MG tablet TAKE 1 TABLET BY MOUTH EVERY DAY 90 tablet 1  . traZODone (DESYREL) 50 MG tablet Take 1  tablet (50 mg total) by mouth at bedtime as needed for sleep. 30 tablet 0  . VRAYLAR capsule TAKE 1 CAPSULE (3 MG) BY ORAL ROUTE ONCE DAILY 90 capsule 1  . amLODipine (NORVASC) 5 MG tablet Take 1 tablet (5 mg total) by mouth daily. For high blood pressure    . hydrochlorothiazide (MICROZIDE) 12.5 MG capsule Take 1 capsule (12.5 mg total) by mouth daily. For high blood pressure    . irbesartan (AVAPRO) 75 MG tablet Take 0.5 tablets (37.5 mg total) by mouth daily. For high blood pressure    . risperiDONE (RISPERDAL) 3 MG tablet Take 1 tablet (3 mg total) by mouth at bedtime. For mood control 30 tablet 0   No facility-administered medications prior to visit.    Allergies  Allergen Reactions  . Avelox [Moxifloxacin Hcl In Nacl] Anaphylaxis  . Lactose Intolerance (Gi) Diarrhea  . Tape Other (See Comments)    If tape on a while will pull off skin    ROS Review of Systems  Constitutional: Negative for chills, fatigue and fever.  HENT: Negative for congestion, ear pain and sore throat.   Respiratory: Negative for cough and shortness of breath.   Cardiovascular: Negative for chest pain.  Gastrointestinal: Negative for abdominal pain, constipation, diarrhea, nausea and vomiting.  Genitourinary: Negative for dysuria and urgency.  Musculoskeletal: Negative for arthralgias and myalgias.  Skin: Negative for rash.  Neurological: Negative for dizziness and headaches.  Psychiatric/Behavioral: Negative for dysphoric mood. The patient is not nervous/anxious.       Objective:    Physical Exam Vitals reviewed.  Constitutional:      Appearance: Normal appearance. She is well-developed. She is obese.  Cardiovascular:     Rate and Rhythm: Normal rate and regular rhythm.     Pulses: Normal pulses.     Heart sounds: Normal heart sounds.  Pulmonary:     Effort: Pulmonary effort is normal.     Breath sounds: Normal breath sounds.  Abdominal:     General: Bowel sounds are normal.     Palpations:  Abdomen is soft.     Tenderness: There is no abdominal tenderness.  Neurological:     Mental Status: She is alert and oriented to person, place, and time.  Psychiatric:        Mood and Affect: Mood normal.        Behavior: Behavior normal.     BP (!) 136/84   Pulse 104   Temp 98.1 F (36.7 C)   Resp 18   Ht 5\' 3"  (1.6 m)   Wt (!) 232 lb (105.2 kg)   BMI 41.10 kg/m  Wt Readings from Last 3 Encounters:  10/04/19 (!) 232 lb (105.2 kg)  12/14/12 164 lb (74.4 kg)  07/10/12 184 lb  11.2 oz (83.8 kg)     Health Maintenance Due  Topic Date Due  . Hepatitis C Screening  Never done  . COVID-19 Vaccine (1) Never done  . HIV Screening  Never done  . TETANUS/TDAP  Never done  . PAP SMEAR-Modifier  Never done  . MAMMOGRAM  Never done  . COLONOSCOPY  Never done  . INFLUENZA VACCINE  10/10/2019    There are no preventive care reminders to display for this patient.  Lab Results  Component Value Date   TSH 0.382 08/25/2017   Lab Results  Component Value Date   WBC 10.0 07/10/2012   HGB 12.6 07/10/2012   HCT 35.3 (L) 07/10/2012   MCV 84.0 07/10/2012   PLT 260 07/10/2012   Lab Results  Component Value Date   NA 137 07/10/2012   K 3.2 (L) 07/13/2012   CO2 32 07/10/2012   GLUCOSE 94 07/10/2012   BUN 12 07/10/2012   CREATININE 1.02 07/10/2012   CALCIUM 9.1 07/10/2012   Lab Results  Component Value Date   CHOL 160 08/25/2017   Lab Results  Component Value Date   HDL 43 08/25/2017   Lab Results  Component Value Date   LDLCALC 92 08/25/2017   Lab Results  Component Value Date   TRIG 126 08/25/2017   Lab Results  Component Value Date   CHOLHDL 3.7 08/25/2017   Lab Results  Component Value Date   HGBA1C 5.3 08/25/2017      Assessment & Plan:   1. Essential hypertension, benign Well controlled.  No changes to medicines.  Continue to work on eating a healthy diet and exercise.   2. Mixed hyperlipidemia Well controlled.  No changes to medicines.    Continue to work on eating a healthy diet and exercise.   3. Bipolar 1 disorder, mixed, mild (HCC) The current medical regimen is effective;  continue present plan and medications.  4. Gastroesophageal reflux disease without esophagitis Well controlled.  5. Colon cancer screening Refer to GI for colonoscopy.  Follow-up: Return in about 3 months (around 01/04/2020) for fasting.    Blane Ohara, MD

## 2019-10-10 ENCOUNTER — Encounter: Payer: Self-pay | Admitting: Family Medicine

## 2019-10-21 ENCOUNTER — Other Ambulatory Visit: Payer: Self-pay

## 2019-10-21 MED ORDER — AMPHETAMINE-DEXTROAMPHET ER 30 MG PO CP24: 30.0000 mg | ORAL_CAPSULE | Freq: Every day | ORAL | 0 refills | Status: DC

## 2019-10-21 MED ORDER — AMPHETAMINE-DEXTROAMPHETAMINE 20 MG PO TABS: 20.0000 mg | ORAL_TABLET | Freq: Every day | ORAL | 0 refills | Status: DC

## 2019-10-24 ENCOUNTER — Other Ambulatory Visit: Payer: Self-pay | Admitting: Family Medicine

## 2019-11-17 ENCOUNTER — Other Ambulatory Visit: Payer: Self-pay

## 2019-11-17 MED ORDER — AMPHETAMINE-DEXTROAMPHETAMINE 20 MG PO TABS: 20.0000 mg | ORAL_TABLET | Freq: Every day | ORAL | 0 refills | Status: DC

## 2019-11-17 MED ORDER — AMPHETAMINE-DEXTROAMPHET ER 30 MG PO CP24: 30.0000 mg | ORAL_CAPSULE | Freq: Every day | ORAL | 0 refills | Status: DC

## 2019-11-23 ENCOUNTER — Other Ambulatory Visit: Payer: Self-pay | Admitting: Family Medicine

## 2019-12-17 ENCOUNTER — Other Ambulatory Visit: Payer: Self-pay

## 2019-12-17 MED ORDER — AMPHETAMINE-DEXTROAMPHETAMINE 20 MG PO TABS: 20.0000 mg | ORAL_TABLET | Freq: Every day | ORAL | 0 refills | Status: DC

## 2019-12-17 MED ORDER — AMPHETAMINE-DEXTROAMPHET ER 30 MG PO CP24: 30.0000 mg | ORAL_CAPSULE | Freq: Every day | ORAL | 0 refills | Status: DC

## 2020-01-11 ENCOUNTER — Ambulatory Visit: Payer: BC Managed Care – PPO | Admitting: Family Medicine

## 2020-01-14 ENCOUNTER — Other Ambulatory Visit: Payer: Self-pay

## 2020-01-14 MED ORDER — AMPHETAMINE-DEXTROAMPHET ER 30 MG PO CP24
30.0000 mg | ORAL_CAPSULE | Freq: Every day | ORAL | 0 refills | Status: DC
Start: 2020-01-14 — End: 2020-02-08

## 2020-01-14 MED ORDER — AMPHETAMINE-DEXTROAMPHETAMINE 20 MG PO TABS
20.0000 mg | ORAL_TABLET | Freq: Every day | ORAL | 0 refills | Status: DC
Start: 2020-01-14 — End: 2020-02-08

## 2020-01-17 ENCOUNTER — Other Ambulatory Visit: Payer: Self-pay | Admitting: Family Medicine

## 2020-01-18 ENCOUNTER — Other Ambulatory Visit: Payer: Self-pay | Admitting: Family Medicine

## 2020-01-20 ENCOUNTER — Other Ambulatory Visit: Payer: Self-pay

## 2020-01-20 MED ORDER — PAROXETINE HCL ER 37.5 MG PO TB24: 37.5000 mg | ORAL_TABLET | Freq: Every day | ORAL | 1 refills | Status: DC

## 2020-01-27 ENCOUNTER — Encounter: Payer: Self-pay | Admitting: Family Medicine

## 2020-01-27 ENCOUNTER — Other Ambulatory Visit: Payer: Self-pay

## 2020-01-27 ENCOUNTER — Ambulatory Visit: Payer: BC Managed Care – PPO | Admitting: Family Medicine

## 2020-01-27 VITALS — BP 115/70 | HR 91 | Temp 98.1°F | Resp 18 | Ht 63.0 in | Wt 234.2 lb

## 2020-01-27 DIAGNOSIS — Z6841 Body Mass Index (BMI) 40.0 and over, adult: Secondary | ICD-10-CM

## 2020-01-27 DIAGNOSIS — E782 Mixed hyperlipidemia: Secondary | ICD-10-CM | POA: Diagnosis not present

## 2020-01-27 DIAGNOSIS — I10 Essential (primary) hypertension: Secondary | ICD-10-CM

## 2020-01-27 DIAGNOSIS — Z23 Encounter for immunization: Secondary | ICD-10-CM | POA: Diagnosis not present

## 2020-01-27 DIAGNOSIS — F902 Attention-deficit hyperactivity disorder, combined type: Secondary | ICD-10-CM | POA: Diagnosis not present

## 2020-01-27 DIAGNOSIS — F3161 Bipolar disorder, current episode mixed, mild: Secondary | ICD-10-CM | POA: Diagnosis not present

## 2020-01-27 NOTE — Progress Notes (Signed)
Subjective:  Patient ID: Anna [name], female    DOB: 03-22-1970  Age: 49 y.o. MRN: [id number]  Chief Complaint  Patient presents with  . Hyperlipidemia    HPI  HTN; TAKES OLMESARTAN/AMLODIPINE/HCTZ. Controls bp well.. ADHD: TAKES ADDERALL XR 30 MG IN AM AND ADDERAL 20 MG EARLY EVENING PRIOR TO CLASSES. HELPS FOCUS.  BIPOLAR:  TAKING VRAYLAR 3 MG ONCE DAILY, PAXIL CR 37.5 MG ONCE DAILY, AND XANAX 0.5 MG ONCE DAILY PRN ANXIETY. TAKES XANAX 1-2 TIMES PER WEEK. DENIES DEPRESSION OR ANXIETY. MORBID OBESITY: BMI 41. TRIES TO EAT HEALTHY. NOT EXERCISING MUCH.    Current Outpatient Medications on File Prior to Visit  Medication Sig Dispense Refill  . ALPRAZolam (XANAX) 0.5 MG tablet TAKE 1 TABLET BY MOUTH DAILY AS NEEDED FOR SEVERE ANXIETY 30 tablet 3  . amphetamine-dextroamphetamine (ADDERALL XR) 30 MG 24 hr capsule Take 1 capsule (30 mg total) by mouth daily. 30 capsule 0  . amphetamine-dextroamphetamine (ADDERALL) 20 MG tablet Take 1 tablet (20 mg total) by mouth daily. 30 tablet 0  . hydrOXYzine (ATARAX/VISTARIL) 50 MG tablet Take 1 tablet (50 mg total) by mouth every 6 (six) hours as needed for anxiety. 60 tablet 0  . Olmesartan-amLODIPine-HCTZ 20-5-12.5 MG TABS TAKE 1 TABLET BY MOUTH EVERY DAY 90 tablet 1  . PARoxetine (PAXIL-CR) 37.5 MG 24 hr tablet Take 1 tablet (37.5 mg total) by mouth daily. 90 tablet 1  . pravastatin (PRAVACHOL) 40 MG tablet TAKE 1 TABLET BY MOUTH EVERY DAY 90 tablet 1  . traZODone (DESYREL) 50 MG tablet TAKE 1 TABLET BY MOUTH EVERYDAY AT BEDTIME 90 tablet 3  . VRAYLAR capsule TAKE 1 CAPSULE (3 MG) BY ORAL ROUTE ONCE DAILY 90 capsule 1   No current facility-administered medications on file prior to visit.   Past Medical History:  Diagnosis Date  . Arthritis   . Asthma    seasonal  . GERD (gastroesophageal reflux disease)    occ  . History of recurrent UTIs 1/14  . Hypertension    Past Surgical History:  Procedure Laterality Date  . ABDOMINAL HYSTERECTOMY     . ANTERIOR CERVICAL DECOMP/DISCECTOMY FUSION N/A 07/13/2012   Procedure: ANTERIOR CERVICAL DECOMPRESSION/DISCECTOMY FUSION 1 LEVEL;  Surgeon: Mariam Dollar, MD;  Location: [facility];  Service: Neurosurgery;  Laterality: N/A;  Anterior Cervical Decompression/Discectomy Cervical Three-Four  . CARPAL TUNNEL RELEASE Right   . CERVICAL DISC SURGERY  09  . CHOLECYSTECTOMY      History reviewed. No pertinent family history. Social History   Socioeconomic History  . Marital status: Married    Spouse name: Not on file  . Number of children: Not on file  . Years of education: Not on file  . Highest education level: Not on file  Occupational History  . Not on file  Tobacco Use  . Smoking status: Never Smoker  . Smokeless tobacco: Never Used  Vaping Use  . Vaping Use: Never used  Substance and Sexual Activity  . Alcohol use: No  . 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 Sexual activity: Not on file  Other Topics Concern  . Not on file  Social History Narrative  . Not on file   Social Determinants of Health   Financial Resource Strain:   . Difficulty of Paying Living Expenses: Not on file  Food Insecurity:   . Worried About Programme researcher, broadcasting/film/video in the Last Year: Not on file  . Ran  Out of Food in the Last Year: Not on file  Transportation Needs:   . Lack of Transportation (Medical): Not on file  . Lack of Transportation (Non-Medical): Not on file  Physical Activity:   . Days of Exercise per Week: Not on file  . Minutes of Exercise per Session: Not on file  Stress:   . Feeling of Stress : Not on file  Social Connections:   . Frequency of Communication with Friends and Family: Not on file  . Frequency of Social Gatherings with Friends and Family: Not on file  . Attends Religious Services: Not on file  . Active Member of Clubs or Organizations: Not on file  . Attends Banker Meetings: Not on file  . Marital Status: Not on file    Review of  Systems  Constitutional: Negative for fatigue.  HENT: Negative for sore throat.   Respiratory: Negative for cough, chest tightness and shortness of breath.   Genitourinary: Negative for urgency.     Objective:  BP 115/70   Pulse 91   Temp 98.1 F (36.7 C)   Resp 18   Ht 5\' 3"  (1.6 m)   Wt 234 lb 3.2 oz (106.2 kg)   SpO2 96%   BMI 41.49 kg/m   BP/Weight 01/27/2020 10/04/2019 03/19/2016  Systolic BP 115 136 142  Diastolic BP 70 84 90  Wt. (Lbs) 234.2 232 -  BMI 41.49 41.1 -  Some encounter information is confidential and restricted. Go to Review Flowsheets activity to see all data.    Physical Exam Vitals reviewed.  Constitutional:      Appearance: Normal appearance. She is normal weight.  Neck:     Vascular: No carotid bruit.  Cardiovascular:     Rate and Rhythm: Normal rate and regular rhythm.     Pulses: Normal pulses.     Heart sounds: Normal heart sounds.  Pulmonary:     Effort: Pulmonary effort is normal. No respiratory distress.     Breath sounds: Normal breath sounds.  Abdominal:     General: Abdomen is flat. Bowel sounds are normal.     Palpations: Abdomen is soft.     Tenderness: There is no abdominal tenderness.  Neurological:     Mental Status: She is alert and oriented to person, place, and time.  Psychiatric:        Mood and Affect: Mood normal.        Behavior: Behavior normal.    Assessment & Plan:   1. Essential hypertension, benign Well controlled.  No changes to medicines.  Continue to work on eating a healthy diet and exercise.  Labs drawn today.  - CBC with Differential/Platelet - Comprehensive metabolic panel - Lipid panel - TSH  2. Mixed hyperlipidemia Well controlled.  No changes to medicines.  Continue to work on eating a healthy diet and exercise.  Labs drawn today.  - Lipid panel  3. Bipolar 1 disorder, mixed, mild (HCC) The current medical regimen is effective;  continue present plan and medications.  4. Attention  deficit hyperactivity disorder (ADHD), combined type The current medical regimen is effective;  continue present plan and medications.  5. Encounter for immunization - Pfizer SARS-COV-2 Vaccine - Flu Vaccine MDCK QUAD PF  6. Morbid obesity with BMI of 40.0-44.9, adult (HCC) Recommend continue to work on eating healthy diet and exercise.  Orders Placed This Encounter  Procedures  . CBC with Differential/Platelet  . Comprehensive metabolic panel  . Lipid panel  . TSH  I spent 30 minutes dedicated to the care of this patient on the date of this encounter to include face-to-face time with the patient, as well as review of labs, refill of medications, performing an exam and evaluation, and documenting in EMR.   Follow-up: Return in about 3 months (around 04/28/2020), or fasting.  An After Visit Summary was printed and given to the patient.  Blane Ohara, MD [facility] [phone]

## 2020-01-28 LAB — CBC WITH DIFFERENTIAL/PLATELET
Basophils Absolute: 0.1 10*3/uL (ref 0.0–0.2)
Basos: 1 %
EOS (ABSOLUTE): 0.1 10*3/uL (ref 0.0–0.4)
Eos: 2 %
Hematocrit: 38.6 % (ref 34.0–46.6)
Hemoglobin: 13.2 g/dL (ref 11.1–15.9)
Immature Grans (Abs): 0.1 10*3/uL (ref 0.0–0.1)
Immature Granulocytes: 1 %
Lymphocytes Absolute: 2 10*3/uL (ref 0.7–3.1)
Lymphs: 29 %
MCH: 29.7 pg (ref 26.6–33.0)
MCHC: 34.2 g/dL (ref 31.5–35.7)
MCV: 87 fL (ref 79–97)
Monocytes Absolute: 0.6 10*3/uL (ref 0.1–0.9)
Monocytes: 9 %
Neutrophils Absolute: 4.2 10*3/uL (ref 1.4–7.0)
Neutrophils: 58 %
Platelets: 303 10*3/uL (ref 150–450)
RBC: 4.45 x10E6/uL (ref 3.77–5.28)
RDW: 12.6 % (ref 11.7–15.4)
WBC: 7 10*3/uL (ref 3.4–10.8)

## 2020-01-28 LAB — LIPID PANEL
Chol/HDL Ratio: 3.6 ratio (ref 0.0–4.4)
Cholesterol, Total: 163 mg/dL (ref 100–199)
HDL: 45 mg/dL (ref >39)
LDL Chol Calc (NIH): 98 mg/dL (ref 0–99)
Triglycerides: 107 mg/dL (ref 0–149)
VLDL Cholesterol Cal: 20 mg/dL (ref 5–40)

## 2020-01-28 LAB — COMPREHENSIVE METABOLIC PANEL
ALT: 29 IU/L (ref 0–32)
AST: 32 IU/L (ref 0–40)
Albumin/Globulin Ratio: 2 (ref 1.2–2.2)
Albumin: 4.5 g/dL (ref 3.8–4.9)
Alkaline Phosphatase: 78 IU/L (ref 44–121)
BUN/Creatinine Ratio: 10 (ref 9–23)
BUN: 11 mg/dL (ref 6–24)
Bilirubin Total: 0.5 mg/dL (ref 0.0–1.2)
CO2: 25 mmol/L (ref 20–29)
Calcium: 9.5 mg/dL (ref 8.7–10.2)
Chloride: 95 mmol/L — ABNORMAL LOW (ref 96–106)
Creatinine, Ser: 1.11 mg/dL — ABNORMAL HIGH (ref 0.57–1.00)
GFR calc Af Amer: 66 mL/min/{1.73_m2} (ref >59)
GFR calc non Af Amer: 58 mL/min/{1.73_m2} — ABNORMAL LOW (ref >59)
Globulin, Total: 2.3 g/dL (ref 1.5–4.5)
Glucose: 115 mg/dL — ABNORMAL HIGH (ref 65–99)
Potassium: 4 mmol/L (ref 3.5–5.2)
Sodium: 134 mmol/L (ref 134–144)
Total Protein: 6.8 g/dL (ref 6.0–8.5)

## 2020-01-28 LAB — CARDIOVASCULAR RISK ASSESSMENT

## 2020-01-28 LAB — TSH: TSH: 0.954 u[IU]/mL (ref 0.450–4.500)

## 2020-01-31 ENCOUNTER — Other Ambulatory Visit: Payer: Self-pay

## 2020-01-31 DIAGNOSIS — R799 Abnormal finding of blood chemistry, unspecified: Secondary | ICD-10-CM

## 2020-02-08 ENCOUNTER — Other Ambulatory Visit: Payer: Self-pay

## 2020-02-08 MED ORDER — AMPHETAMINE-DEXTROAMPHET ER 30 MG PO CP24
30.0000 mg | ORAL_CAPSULE | Freq: Every day | ORAL | 0 refills | Status: DC
Start: 2020-02-08 — End: 2020-03-22

## 2020-02-08 MED ORDER — AMPHETAMINE-DEXTROAMPHETAMINE 20 MG PO TABS
20.0000 mg | ORAL_TABLET | Freq: Every day | ORAL | 0 refills | Status: DC
Start: 2020-02-08 — End: 2020-03-13

## 2020-02-14 ENCOUNTER — Other Ambulatory Visit: Payer: BC Managed Care – PPO

## 2020-02-14 ENCOUNTER — Other Ambulatory Visit: Payer: Self-pay

## 2020-02-14 DIAGNOSIS — R944 Abnormal results of kidney function studies: Secondary | ICD-10-CM

## 2020-02-15 LAB — COMPREHENSIVE METABOLIC PANEL
ALT: 25 IU/L (ref 0–32)
AST: 22 IU/L (ref 0–40)
Albumin/Globulin Ratio: 1.8 (ref 1.2–2.2)
Albumin: 4.1 g/dL (ref 3.8–4.9)
Alkaline Phosphatase: 91 IU/L (ref 44–121)
BUN/Creatinine Ratio: 11 (ref 9–23)
BUN: 13 mg/dL (ref 6–24)
Bilirubin Total: 0.2 mg/dL (ref 0.0–1.2)
CO2: 21 mmol/L (ref 20–29)
Calcium: 8.9 mg/dL (ref 8.7–10.2)
Chloride: 101 mmol/L (ref 96–106)
Creatinine, Ser: 1.15 mg/dL — ABNORMAL HIGH (ref 0.57–1.00)
GFR calc Af Amer: 64 mL/min/{1.73_m2} (ref >59)
GFR calc non Af Amer: 55 mL/min/{1.73_m2} — ABNORMAL LOW (ref >59)
Globulin, Total: 2.3 g/dL (ref 1.5–4.5)
Glucose: 165 mg/dL — ABNORMAL HIGH (ref 65–99)
Potassium: 4 mmol/L (ref 3.5–5.2)
Sodium: 137 mmol/L (ref 134–144)
Total Protein: 6.4 g/dL (ref 6.0–8.5)

## 2020-03-13 ENCOUNTER — Other Ambulatory Visit: Payer: Self-pay

## 2020-03-13 MED ORDER — AMPHETAMINE-DEXTROAMPHETAMINE 20 MG PO TABS
20.0000 mg | ORAL_TABLET | Freq: Every day | ORAL | 0 refills | Status: DC
Start: 2020-03-13 — End: 2020-04-07

## 2020-03-22 ENCOUNTER — Other Ambulatory Visit: Payer: Self-pay

## 2020-03-22 MED ORDER — AMPHETAMINE-DEXTROAMPHET ER 30 MG PO CP24
30.0000 mg | ORAL_CAPSULE | Freq: Every day | ORAL | 0 refills | Status: DC
Start: 2020-03-22 — End: 2020-04-18

## 2020-04-07 ENCOUNTER — Other Ambulatory Visit: Payer: Self-pay

## 2020-04-07 MED ORDER — AMPHETAMINE-DEXTROAMPHETAMINE 20 MG PO TABS
20.0000 mg | ORAL_TABLET | Freq: Every day | ORAL | 0 refills | Status: DC
Start: 2020-04-07 — End: 2020-05-02

## 2020-04-18 ENCOUNTER — Other Ambulatory Visit: Payer: Self-pay

## 2020-04-18 MED ORDER — AMPHETAMINE-DEXTROAMPHET ER 30 MG PO CP24
30.0000 mg | ORAL_CAPSULE | Freq: Every day | ORAL | 0 refills | Status: DC
Start: 2020-04-18 — End: 2020-05-16

## 2020-04-20 ENCOUNTER — Other Ambulatory Visit: Payer: Self-pay | Admitting: Family Medicine

## 2020-04-27 ENCOUNTER — Encounter: Payer: Self-pay | Admitting: Family Medicine

## 2020-04-28 NOTE — Progress Notes (Signed)
Subjective:  Patient ID: Anna [name], female    DOB: 29-Jan-1971  Age: 50 y.o. MRN: [id number]  Chief Complaint  Patient presents with  . Hypertension  . Hyperlipidemia    HPI Essential hypertension, benign Patient is taking olmesartan-amlodipine-Hctz 20-5-12.5 mg everyday. Not eating healthy, but walking daily. Getting 4500 steps per day.   Mixed hyperlipidemia: Patient is taking pravastatin 40mg  every day  Bipolar 1 disorder, mixed, mild (HCC) Patient takes vraylar 3 mg 1 capsule everyday. Trazodone 50mg  daily, Paroxetine 37.5 mg everyday, xanax 0.5mg  daily prn severe anxiety and hydroxyzine 50mg  daily every 6 hours as needed for anxiety  Attention deficit hyperactivity disorder (ADHD), combined type Patient is controlled with adderall 20 mg in the evening before school and 30 mg XR in am every day.  Morbid obesity with BMI of 40.0-44.9, adult (HCC) Not eating healthy. Is exercising.   Current Outpatient Medications on File Prior to Visit  Medication Sig Dispense Refill  . ALPRAZolam (XANAX) 0.5 MG tablet TAKE 1 TABLET BY MOUTH DAILY AS NEEDED FOR SEVERE ANXIETY 30 tablet 3  . amphetamine-dextroamphetamine (ADDERALL XR) 30 MG 24 hr capsule Take 1 capsule (30 mg total) by mouth daily. 30 capsule 0  . amphetamine-dextroamphetamine (ADDERALL) 20 MG tablet Take 1 tablet (20 mg total) by mouth daily. 30 tablet 0  . hydrOXYzine (ATARAX/VISTARIL) 50 MG tablet Take 1 tablet (50 mg total) by mouth every 6 (six) hours as needed for anxiety. 60 tablet 0  . Olmesartan-amLODIPine-HCTZ 20-5-12.5 MG TABS TAKE 1 TABLET BY MOUTH EVERY DAY 90 tablet 1  . PARoxetine (PAXIL-CR) 37.5 MG 24 hr tablet Take 1 tablet (37.5 mg total) by mouth daily. 90 tablet 1  . pravastatin (PRAVACHOL) 40 MG tablet TAKE 1 TABLET BY MOUTH EVERY DAY 90 tablet 1  . traZODone (DESYREL) 50 MG tablet TAKE 1 TABLET BY MOUTH EVERYDAY AT BEDTIME 90 tablet 3  . VRAYLAR capsule TAKE 1 CAPSULE (3 MG) BY ORAL ROUTE ONCE DAILY 90  capsule 1   No current facility-administered medications on file prior to visit.   Past Medical History:  Diagnosis Date  . Arthritis   . Asthma    seasonal  . GERD (gastroesophageal reflux disease)    occ  . History of recurrent UTIs 1/14  . Hypertension    Past Surgical History:  Procedure Laterality Date  . ABDOMINAL HYSTERECTOMY    . ANTERIOR CERVICAL DECOMP/DISCECTOMY FUSION N/A 07/13/2012   Procedure: ANTERIOR CERVICAL DECOMPRESSION/DISCECTOMY FUSION 1 LEVEL;  Surgeon: 05-02-1978, MD;  Location: [facility];  Service: Neurosurgery;  Laterality: N/A;  Anterior Cervical Decompression/Discectomy Cervical Three-Four  . CARPAL TUNNEL RELEASE Right   . CERVICAL DISC SURGERY  09  . CHOLECYSTECTOMY      History reviewed. No pertinent family history. Social History   Socioeconomic History  . Marital status: Married    Spouse name: Not on file  . Number of children: Not on file  . Years of education: Not on file  . Highest education level: Not on file  Occupational History  . Not on file  Tobacco Use  . Smoking status: Never Smoker  . Smokeless tobacco: Never Used  Vaping Use  . Vaping Use: Never used  Substance and Sexual Activity  . Alcohol use: No  . 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 Sexual activity: Not on file  Other Topics Concern  . Not on file  Social History Narrative  . Not on  file   Social Determinants of Health   Financial Resource Strain: Not on file  Food Insecurity: Not on file  Transportation Needs: Not on file  Physical Activity: Not on file  Stress: Not on file  Social Connections: Not on file    Review of Systems  Constitutional: Negative for chills, fatigue and fever.  HENT: Negative for congestion, ear pain and sore throat.   Respiratory: Negative for cough and shortness of breath.   Cardiovascular: Negative for chest pain and palpitations.  Gastrointestinal: Negative for abdominal pain, constipation,  diarrhea, nausea and vomiting.  Endocrine: Negative for polydipsia, polyphagia and polyuria.  Genitourinary: Negative for difficulty urinating and dysuria.  Musculoskeletal: Negative for arthralgias, back pain and myalgias.  Skin: Negative for rash.  Neurological: Negative for headaches.  Psychiatric/Behavioral: Positive for dysphoric mood (controlled with medications). The patient is not nervous/anxious.      Objective:  BP 108/70   Pulse 84   Temp 98 F (36.7 C)   Resp 18   Ht 5\' 3"  (1.6 m)   Wt 247 lb (112 kg)   BMI 43.75 kg/m   BP/Weight 05/01/2020 01/27/2020 10/04/2019  Systolic BP 108 115 136  Diastolic BP 70 70 84  Wt. (Lbs) 247 234.2 232  BMI 43.75 41.49 41.1  Some encounter information is confidential and restricted. Go to Review Flowsheets activity to see all data.    Physical Exam Vitals reviewed.  Constitutional:      Appearance: Normal appearance.  Neck:     Vascular: No carotid bruit.  Cardiovascular:     Rate and Rhythm: Normal rate and regular rhythm.     Pulses: Normal pulses.     Heart sounds: Normal heart sounds.  Pulmonary:     Effort: Pulmonary effort is normal.     Breath sounds: Normal breath sounds.  Abdominal:     General: Bowel sounds are normal.     Palpations: Abdomen is soft.     Tenderness: There is no abdominal tenderness.  Neurological:     Mental Status: She is alert and oriented to person, place, and time.  Psychiatric:        Mood and Affect: Mood normal.        Behavior: Behavior normal.     Diabetic Foot Exam - Simple   No data filed      Lab Results  Component Value Date   WBC 7.0 01/27/2020   HGB 13.2 01/27/2020   HCT 38.6 01/27/2020   PLT 303 01/27/2020   GLUCOSE 165 (H) 02/14/2020   CHOL 163 01/27/2020   TRIG 107 01/27/2020   HDL 45 01/27/2020   LDLCALC 98 01/27/2020   ALT 25 02/14/2020   AST 22 02/14/2020   NA 137 02/14/2020   K 4.0 02/14/2020   CL 101 02/14/2020   CREATININE 1.15 (H) 02/14/2020   BUN  13 02/14/2020   CO2 21 02/14/2020   TSH 0.954 01/27/2020   HGBA1C 5.3 08/25/2017      Assessment & Plan:   1. Essential hypertension, benign The current medical regimen is effective;  continue present plan and medications. Dash diet recommended.  - Comprehensive metabolic panel - CBC with Differential/Platelet  2. Mixed hyperlipidemia Recommend continue to work on eating healthy diet and exercise. The current medical regimen is effective;  continue present plan and medications. - Lipid panel  3. Bipolar 1 disorder, mixed, mild (HCC) The current medical regimen is effective;  continue present plan and medications.  4. Attention deficit hyperactivity  disorder (ADHD), combined type The current medical regimen is effective;  continue present plan and medications.  5. Morbid obesity with BMI of 40.0-44.9, adult (HCC) Recommend continue to work on eating healthy diet and exercise.    Orders Placed This Encounter  Procedures  . Comprehensive metabolic panel  . Lipid panel  . CBC with Differential/Platelet    Follow-up: No follow-ups on file.  An After Visit Summary was printed and given to the patient.  Blane Ohara, MD [facility] [phone]

## 2020-05-01 ENCOUNTER — Encounter: Payer: Self-pay | Admitting: Family Medicine

## 2020-05-01 ENCOUNTER — Other Ambulatory Visit: Payer: Self-pay

## 2020-05-01 ENCOUNTER — Ambulatory Visit (INDEPENDENT_AMBULATORY_CARE_PROVIDER_SITE_OTHER): Payer: BC Managed Care – PPO | Admitting: Family Medicine

## 2020-05-01 VITALS — BP 108/70 | HR 84 | Temp 98.0°F | Resp 18 | Ht 63.0 in | Wt 247.0 lb

## 2020-05-01 DIAGNOSIS — E782 Mixed hyperlipidemia: Secondary | ICD-10-CM | POA: Diagnosis not present

## 2020-05-01 DIAGNOSIS — K219 Gastro-esophageal reflux disease without esophagitis: Secondary | ICD-10-CM

## 2020-05-01 DIAGNOSIS — F902 Attention-deficit hyperactivity disorder, combined type: Secondary | ICD-10-CM

## 2020-05-01 DIAGNOSIS — Z6841 Body Mass Index (BMI) 40.0 and over, adult: Secondary | ICD-10-CM

## 2020-05-01 DIAGNOSIS — I10 Essential (primary) hypertension: Secondary | ICD-10-CM | POA: Diagnosis not present

## 2020-05-01 DIAGNOSIS — F3161 Bipolar disorder, current episode mixed, mild: Secondary | ICD-10-CM | POA: Diagnosis not present

## 2020-05-01 NOTE — Patient Instructions (Signed)

## 2020-05-02 ENCOUNTER — Other Ambulatory Visit: Payer: Self-pay

## 2020-05-02 LAB — CBC WITH DIFFERENTIAL/PLATELET
Basophils Absolute: 0.1 10*3/uL (ref 0.0–0.2)
Basos: 1 %
EOS (ABSOLUTE): 0.2 10*3/uL (ref 0.0–0.4)
Eos: 2 %
Hematocrit: 40.5 % (ref 34.0–46.6)
Hemoglobin: 13.4 g/dL (ref 11.1–15.9)
Immature Grans (Abs): 0.1 10*3/uL (ref 0.0–0.1)
Immature Granulocytes: 1 %
Lymphocytes Absolute: 2.9 10*3/uL (ref 0.7–3.1)
Lymphs: 37 %
MCH: 29.6 pg (ref 26.6–33.0)
MCHC: 33.1 g/dL (ref 31.5–35.7)
MCV: 90 fL (ref 79–97)
Monocytes Absolute: 0.6 10*3/uL (ref 0.1–0.9)
Monocytes: 8 %
Neutrophils Absolute: 4 10*3/uL (ref 1.4–7.0)
Neutrophils: 51 %
Platelets: 304 10*3/uL (ref 150–450)
RBC: 4.52 x10E6/uL (ref 3.77–5.28)
RDW: 12.9 % (ref 11.7–15.4)
WBC: 7.8 10*3/uL (ref 3.4–10.8)

## 2020-05-02 LAB — COMPREHENSIVE METABOLIC PANEL
ALT: 26 IU/L (ref 0–32)
AST: 23 IU/L (ref 0–40)
Albumin/Globulin Ratio: 1.6 (ref 1.2–2.2)
Albumin: 4.1 g/dL (ref 3.8–4.9)
Alkaline Phosphatase: 96 IU/L (ref 44–121)
BUN/Creatinine Ratio: 12 (ref 9–23)
BUN: 13 mg/dL (ref 6–24)
Bilirubin Total: <0.2 mg/dL (ref 0.0–1.2)
CO2: 23 mmol/L (ref 20–29)
Calcium: 9.3 mg/dL (ref 8.7–10.2)
Chloride: 100 mmol/L (ref 96–106)
Creatinine, Ser: 1.12 mg/dL — ABNORMAL HIGH (ref 0.57–1.00)
GFR calc Af Amer: 66 mL/min/{1.73_m2} (ref >59)
GFR calc non Af Amer: 57 mL/min/{1.73_m2} — ABNORMAL LOW (ref >59)
Globulin, Total: 2.6 g/dL (ref 1.5–4.5)
Glucose: 96 mg/dL (ref 65–99)
Potassium: 4 mmol/L (ref 3.5–5.2)
Sodium: 138 mmol/L (ref 134–144)
Total Protein: 6.7 g/dL (ref 6.0–8.5)

## 2020-05-02 LAB — LIPID PANEL
Chol/HDL Ratio: 4.1 ratio (ref 0.0–4.4)
Cholesterol, Total: 173 mg/dL (ref 100–199)
HDL: 42 mg/dL (ref >39)
LDL Chol Calc (NIH): 100 mg/dL — ABNORMAL HIGH (ref 0–99)
Triglycerides: 181 mg/dL — ABNORMAL HIGH (ref 0–149)
VLDL Cholesterol Cal: 31 mg/dL (ref 5–40)

## 2020-05-02 LAB — CARDIOVASCULAR RISK ASSESSMENT

## 2020-05-02 MED ORDER — AMPHETAMINE-DEXTROAMPHETAMINE 20 MG PO TABS: 20.0000 mg | ORAL_TABLET | Freq: Every day | ORAL | 0 refills | Status: DC

## 2020-05-02 NOTE — Telephone Encounter (Signed)
Pt calling requesting refill on medication.

## 2020-05-03 ENCOUNTER — Telehealth: Payer: Self-pay

## 2020-05-03 ENCOUNTER — Other Ambulatory Visit: Payer: Self-pay | Admitting: Family Medicine

## 2020-05-03 MED ORDER — OMEGA-3-ACID ETHYL ESTERS 1 G PO CAPS: 1.0000 g | ORAL_CAPSULE | Freq: Two times a day (BID) | ORAL | 0 refills | Status: DC

## 2020-05-03 MED ORDER — AMLODIPINE-OLMESARTAN 5-20 MG PO TABS: 1.0000 | ORAL_TABLET | Freq: Every day | ORAL | 0 refills | Status: DC

## 2020-05-03 NOTE — Telephone Encounter (Signed)
PA for lovaza submitted and approved via [email or web address] from 05/03/2020 through 05/04/2023.

## 2020-05-16 ENCOUNTER — Other Ambulatory Visit: Payer: Self-pay

## 2020-05-16 MED ORDER — AMPHETAMINE-DEXTROAMPHET ER 30 MG PO CP24: 30.0000 mg | ORAL_CAPSULE | Freq: Every day | ORAL | 0 refills | Status: DC

## 2020-05-20 ENCOUNTER — Other Ambulatory Visit: Payer: Self-pay | Admitting: Physician Assistant

## 2020-05-28 ENCOUNTER — Other Ambulatory Visit: Payer: Self-pay | Admitting: Family Medicine

## 2020-06-08 ENCOUNTER — Other Ambulatory Visit: Payer: Self-pay

## 2020-06-08 MED ORDER — AMPHETAMINE-DEXTROAMPHETAMINE 20 MG PO TABS: 20.0000 mg | ORAL_TABLET | Freq: Every day | ORAL | 0 refills | Status: DC

## 2020-06-08 MED ORDER — AMPHETAMINE-DEXTROAMPHET ER 30 MG PO CP24: 30.0000 mg | ORAL_CAPSULE | Freq: Every day | ORAL | 0 refills | Status: DC

## 2020-06-21 ENCOUNTER — Encounter: Payer: Self-pay | Admitting: Physician Assistant

## 2020-06-21 ENCOUNTER — Telehealth (INDEPENDENT_AMBULATORY_CARE_PROVIDER_SITE_OTHER): Payer: BC Managed Care – PPO | Admitting: Physician Assistant

## 2020-06-21 VITALS — Ht 64.0 in | Wt 245.0 lb

## 2020-06-21 DIAGNOSIS — J018 Other acute sinusitis: Secondary | ICD-10-CM

## 2020-06-21 MED ORDER — AMOXICILLIN 875 MG PO TABS: 875.0000 mg | ORAL_TABLET | Freq: Two times a day (BID) | ORAL | 0 refills | Status: DC

## 2020-06-21 NOTE — Progress Notes (Signed)
Virtual Visit via Telephone Note   This visit type was conducted due to national recommendations for restrictions regarding the COVID-19 Pandemic (e.g. social distancing) in an effort to limit this patient's exposure and mitigate transmission in our community.  Due to her co-morbid illnesses, this patient is at least at moderate risk for complications without adequate follow up.  This format is felt to be most appropriate for this patient at this time.  The patient did not have access to video technology/had technical difficulties with video requiring transitioning to audio format only (telephone).  All issues noted in this document were discussed and addressed.  No physical exam could be performed with this format.  Patient verbally consented to a telehealth visit.   Date:  06/21/2020   ID:  Anna [name], DOB 1970-10-15, MRN [id number]  Patient Location: Home Provider Location: Office  PCP:  Blane Ohara, MD    Chief Complaint:  sinusitis  History of Present Illness:    Anna [name] is a 50 y.o. female with sinusitis- complains of sinus pressure/pnd/sore throat and runny nose- denies fever, headache, bodyaches States she is taking some otc guaifenesin and claritin which is helping  The patient does not have symptoms concerning for COVID-19 infection (fever, chills, cough, or new shortness of breath).    Past Medical History:  Diagnosis Date  . Arthritis   . Asthma    seasonal  . GERD (gastroesophageal reflux disease)    occ  . History of recurrent UTIs 1/14  . Hypertension    Past Surgical History:  Procedure Laterality Date  . ABDOMINAL HYSTERECTOMY    . ANTERIOR CERVICAL DECOMP/DISCECTOMY FUSION N/A 07/13/2012   Procedure: ANTERIOR CERVICAL DECOMPRESSION/DISCECTOMY FUSION 1 LEVEL;  Surgeon: Mariam Dollar, MD;  Location: [facility];  Service: Neurosurgery;  Laterality: N/A;  Anterior Cervical Decompression/Discectomy Cervical Three-Four  . CARPAL TUNNEL RELEASE Right   .  CERVICAL DISC SURGERY  09  . CHOLECYSTECTOMY       No outpatient medications have been marked as taking for the 06/21/20 encounter (Video Visit) with Marianne Sofia, PA-C.     Allergies:   Avelox [moxifloxacin hcl in nacl], Lactose intolerance (gi), and Tape   Social History   Tobacco Use  . Smoking status: Never Smoker  . Smokeless tobacco: Never Used  Vaping Use  . Vaping Use: Never used  Substance Use Topics  . Alcohol use: No  . 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  ROS:   Please see the history of present illness.    All other systems reviewed and are negative.  Labs/Other Tests and Data Reviewed:    Recent Labs: 01/27/2020: TSH 0.954 05/01/2020: ALT 26; BUN 13; Creatinine, Ser 1.12; Hemoglobin 13.4; Platelets 304; Potassium 4.0; Sodium 138   Recent Lipid Panel Lab Results  Component Value Date/Time   CHOL 173 05/01/2020 08:49 AM   TRIG 181 (H) 05/01/2020 08:49 AM   HDL 42 05/01/2020 08:49 AM   CHOLHDL 4.1 05/01/2020 08:49 AM   CHOLHDL 3.7 08/25/2017 06:29 AM   LDLCALC 100 (H) 05/01/2020 08:49 AM    Wt Readings from Last 3 Encounters:  06/21/20 245 lb (111.1 kg)  05/01/20 247 lb (112 kg)  01/27/20 234 lb 3.2 oz (106.2 kg)     Objective:    Vital Signs:  Ht 5\' 4"  (1.626 m)   Wt 245 lb (111.1  kg)   BMI 42.05 kg/m    VITAL SIGNS:  reviewed  ASSESSMENT & PLAN:    1. Sinusitis - continue otc decongestants and rx for amoxil 875mg  bid - follow up if any symptoms change or worsen  COVID-19 Education: The signs and symptoms of COVID-19 were discussed with the patient and how to seek care for testing (follow up with PCP or arrange E-visit). The importance of social distancing was discussed today.  Time:   Today, I have spent 10 minutes with the patient with telehealth technology discussing the above problems.     Medication Adjustments/Labs and Tests Ordered: Current  medicines are reviewed at length with the patient today.  Concerns regarding medicines are outlined above.   Tests Ordered: No orders of the defined types were placed in this encounter.   Medication Changes: No orders of the defined types were placed in this encounter.   Follow Up:  In Person prn  Signed,  06/21/2020 9:51 AM    [facility]

## 2020-07-03 ENCOUNTER — Other Ambulatory Visit: Payer: Self-pay

## 2020-07-03 MED ORDER — AMPHETAMINE-DEXTROAMPHETAMINE 20 MG PO TABS: 20.0000 mg | ORAL_TABLET | Freq: Every day | ORAL | 0 refills | Status: DC

## 2020-07-03 MED ORDER — AMPHETAMINE-DEXTROAMPHET ER 30 MG PO CP24: 30.0000 mg | ORAL_CAPSULE | Freq: Every day | ORAL | 0 refills | Status: DC

## 2020-07-14 ENCOUNTER — Other Ambulatory Visit: Payer: Self-pay | Admitting: Family Medicine

## 2020-07-14 ENCOUNTER — Other Ambulatory Visit: Payer: Self-pay | Admitting: Physician Assistant

## 2020-07-24 ENCOUNTER — Other Ambulatory Visit: Payer: Self-pay

## 2020-07-24 MED ORDER — AMPHETAMINE-DEXTROAMPHETAMINE 20 MG PO TABS
20.0000 mg | ORAL_TABLET | Freq: Every day | ORAL | 0 refills | Status: DC
Start: 2020-07-24 — End: 2020-08-25

## 2020-08-04 ENCOUNTER — Other Ambulatory Visit: Payer: Self-pay | Admitting: Family Medicine

## 2020-08-08 ENCOUNTER — Other Ambulatory Visit: Payer: Self-pay

## 2020-08-08 MED ORDER — AMPHETAMINE-DEXTROAMPHET ER 30 MG PO CP24: 30.0000 mg | ORAL_CAPSULE | Freq: Every day | ORAL | 0 refills | Status: DC

## 2020-08-15 ENCOUNTER — Ambulatory Visit (INDEPENDENT_AMBULATORY_CARE_PROVIDER_SITE_OTHER): Payer: BC Managed Care – PPO

## 2020-08-15 ENCOUNTER — Encounter: Payer: Self-pay | Admitting: Family Medicine

## 2020-08-15 ENCOUNTER — Ambulatory Visit (INDEPENDENT_AMBULATORY_CARE_PROVIDER_SITE_OTHER): Payer: BC Managed Care – PPO | Admitting: Family Medicine

## 2020-08-15 ENCOUNTER — Other Ambulatory Visit: Payer: Self-pay

## 2020-08-15 VITALS — BP 130/78 | HR 74 | Temp 97.0°F | Ht 63.0 in | Wt 235.0 lb

## 2020-08-15 DIAGNOSIS — Z23 Encounter for immunization: Secondary | ICD-10-CM

## 2020-08-15 DIAGNOSIS — Z0000 Encounter for general adult medical examination without abnormal findings: Secondary | ICD-10-CM | POA: Diagnosis not present

## 2020-08-15 DIAGNOSIS — Z1231 Encounter for screening mammogram for malignant neoplasm of breast: Secondary | ICD-10-CM | POA: Diagnosis not present

## 2020-08-15 NOTE — Progress Notes (Signed)
Subjective:  Patient ID: Anna [name], female    DOB: 08-14-1970  Age: 50 y.o. MRN: [id number]  Chief Complaint  Patient presents with  . Annual Exam    HPI Well Adult Physical: Patient here for a comprehensive physical exam.The patient reports no problems Do you take any herbs or supplements that were not prescribed by a doctor? no Are you taking calcium supplements? no Are you taking aspirin daily? no  Encounter for general adult medical examination without abnormal findings  Physical ("At Risk" items are starred): Patient's last physical exam was 1 year ago .  Smoking: Life-long non-smoker ;  Physical Activity: Exercises at least 5 times per week ;  Alcohol/Drug Use: Is a non-drinker ; No illicit drug use ;  Patient has some Stress Incontinence. No Urge Incontinence  Safety: reviewed. Patient wears a seat belt, has smoke detectors, has carbon monoxide detectors, practices appropriate gun safety, and wears sunscreen with extended sun exposure. Dental Care: annual cleanings, brushes and flosses daily. Ophthalmology/Optometry: Annual visit. Overdue. Hearing loss: none Vision impairments: Reading glasses  Menarche: 50y.o Menstrual History: Irregular LMP: S/P Hysterectomy Pregnancy history: 2 live births Safe at home: yes Self breast exams: yes  Mammogram-Due    Social Hx   Social History   Socioeconomic History  . Marital status: Married    Spouse name: Not on file  . Number of children: Not on file  . Years of education: Not on file  . Highest education level: Not on file  Occupational History  . Not on file  Tobacco Use  . Smoking status: Never Smoker  . Smokeless tobacco: Never Used  Vaping Use  . Vaping Use: Never used  Substance and Sexual Activity  . Alcohol use: No  . 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 Sexual activity: Not on file  Other Topics Concern  . Not on file  Social History Narrative  . Not on file   Social  Determinants of Health   Financial Resource Strain: Not on file  Food Insecurity: Not on file  Transportation Needs: Not on file  Physical Activity: Not on file  Stress: Not on file  Social Connections: Not on file   Past Medical History:  Diagnosis Date  . Arthritis   . Asthma    seasonal  . GERD (gastroesophageal reflux disease)    occ  . History of recurrent UTIs 1/14  . Hypertension    Past Surgical History:  Procedure Laterality Date  . ABDOMINAL HYSTERECTOMY  2010   endometriosis. Total hysterectomy  . ANTERIOR CERVICAL DECOMP/DISCECTOMY FUSION N/A 07/13/2012   Procedure: ANTERIOR CERVICAL DECOMPRESSION/DISCECTOMY FUSION 1 LEVEL;  Surgeon: Mariam Dollar, MD;  Location: [facility];  Service: Neurosurgery;  Laterality: N/A;  Anterior Cervical Decompression/Discectomy Cervical Three-Four  . CARPAL TUNNEL RELEASE Right   . CERVICAL DISC SURGERY  09  . CHOLECYSTECTOMY      No family history on file.  Review of Systems   Objective:  BP 130/78   Pulse 74   Temp (!) 97 F (36.1 C)   Ht 5\' 3"  (1.6 m)   Wt 235 lb (106.6 kg)   SpO2 97%   BMI 41.63 kg/m   BP/Weight 08/15/2020 06/21/2020 05/01/2020  Systolic BP 130 - 108  Diastolic BP 78 - 70  Wt. (Lbs) 235 245 247  BMI 41.63 42.05 43.75  Some encounter information is confidential and restricted. Go to Review Flowsheets activity to see all  data.    Physical Exam Vitals reviewed.  Constitutional:      General: She is not in acute distress.    Appearance: Normal appearance. She is obese.  HENT:     Right Ear: Tympanic membrane and ear canal normal.     Left Ear: Tympanic membrane and ear canal normal.     Nose: Nose normal. No congestion or rhinorrhea.  Eyes:     Conjunctiva/sclera: Conjunctivae normal.  Neck:     Thyroid: No thyroid mass.     Vascular: No carotid bruit.  Cardiovascular:     Rate and Rhythm: Normal rate and regular rhythm.     Pulses: Normal pulses.     Heart sounds: No murmur  heard.   Pulmonary:     Effort: Pulmonary effort is normal.     Breath sounds: Normal breath sounds.  Abdominal:     General: Bowel sounds are normal.     Palpations: Abdomen is soft. There is no mass.     Tenderness: There is no abdominal tenderness.  Musculoskeletal:        General: Normal range of motion.  Lymphadenopathy:     Cervical: No cervical adenopathy.  Skin:    General: Skin is warm and dry.  Neurological:     Mental Status: She is alert and oriented to person, place, and time.     Cranial Nerves: No cranial nerve deficit.  Psychiatric:        Mood and Affect: Mood normal.        Behavior: Behavior normal.     Lab Results  Component Value Date   WBC 7.8 05/01/2020   HGB 13.4 05/01/2020   HCT 40.5 05/01/2020   PLT 304 05/01/2020   GLUCOSE 96 05/01/2020   CHOL 173 05/01/2020   TRIG 181 (H) 05/01/2020   HDL 42 05/01/2020   LDLCALC 100 (H) 05/01/2020   ALT 26 05/01/2020   AST 23 05/01/2020   NA 138 05/01/2020   K 4.0 05/01/2020   CL 100 05/01/2020   CREATININE 1.12 (H) 05/01/2020   BUN 13 05/01/2020   CO2 23 05/01/2020   TSH 0.954 01/27/2020   HGBA1C 5.3 08/25/2017      Assessment & Plan:  1. Routine medical exam Recommend continue to work on eating healthy diet and exercise. - CBC with Differential/Platelet - Comprehensive metabolic panel - Lipid panel - TSH  2. Screening mammogram for breast cancer - MM DIGITAL SCREENING BILATERAL  3. Morbid obesity with bmi of 41.  Recommend continue to work on eating healthy diet and exercise.   Body mass index is 41.63 kg/m.   This is a list of the screening recommended for you and due dates:  Health Maintenance  Topic Date Due  . HIV Screening  Never done  . Hepatitis C Screening: USPSTF Recommendation to screen - Ages 12-79 yo.  Never done  . Tetanus Vaccine  Never done  . Pap Smear  Never done  . Colon Cancer Screening  Never done  . Zoster (Shingles) Vaccine (1 of 2) Never done  . Flu Shot   10/09/2020  . Mammogram  02/17/2021  . COVID-19 Vaccine  Completed  . Pneumococcal Vaccination  Aged Out  . HPV Vaccine  Aged Out     AN INDIVIDUALIZED CARE PLAN: was established or reinforced today.   SELF MANAGEMENT: The patient and I together assessed ways to personally work towards obtaining the recommended goals  Support needs The patient and/or family needs were  assessed and services were offered if appropriate.  Follow-up: Return in about 1 year (around 08/15/2021) for CPE.  An After Visit Summary was printed and given to the patient.  Blane Ohara, MD [facility] [phone]

## 2020-08-15 NOTE — Patient Instructions (Addendum)
Critical care medicine: Principles of diagnosis and management in the adult (4th ed., pp. 0350-0938). [employer]unders."> Miller's anesthesia (8th ed., pp. 232-250). [employer]unders.">  Advance Directive  Advance directives are legal documents that allow you to make decisions about your health care and medical treatment in case you become unable to communicate for yourself. Advance directives let your wishes be known to family, friends, and health care providers. Discussing and writing advance directives should happen over time rather than all at once. Advance directives can be changed and updated at any time. There are different types of advance directives, such as:  Medical power of attorney.  Living will.  Do not resuscitate (DNR) order or do not attempt resuscitation (DNAR) order. Health care proxy and medical power of attorney A health care proxy is also called a health care agent. This person is appointed to make medical decisions for you when you are unable to make decisions for yourself. Generally, people ask a trusted friend or family member to act as their proxy and represent their preferences. Make sure you have an agreement with your trusted person to act as your proxy. A proxy may have to make a medical decision on your behalf if your wishes are not known. A medical power of attorney, also called a durable power of attorney for health care, is a legal document that names your health care proxy. Depending on the laws in your state, the document may need to be:  Signed.  Notarized.  Dated.  Copied.  Witnessed.  Incorporated into your medical record. You may also want to appoint a trusted person to manage your money in the event you are unable to do so. This is called a durable power of attorney for finances. It is a separate legal document from the durable power of attorney for health care. You may choose your health care proxy or someone different to act as your agent in money matters. If you  do not appoint a proxy, or there is a concern that the proxy is not acting in your best interest, a court may appoint a guardian to act on your behalf. Living will A living will is a set of instructions that state your wishes about medical care when you cannot express them yourself. Health care providers should keep a copy of your living will in your medical record. You may want to give a copy to family members or friends. To alert caregivers in case of an emergency, you can place a card in your wallet to let them know that you have a living will and where they can find it. A living will is used if you become:  Terminally ill.  Disabled.  Unable to communicate or make decisions. The following decisions should be included in your living will:  To use or not to use life support equipment, such as dialysis machines and breathing machines (ventilators).  Whether you want a DNR or DNAR order. This tells health care providers not to use cardiopulmonary resuscitation (CPR) if breathing or heartbeat stops.  To use or not to use tube feeding.  To be given or not to be given food and fluids.  Whether you want comfort (palliative) care when the goal becomes comfort rather than a cure.  Whether you want to donate your organs and tissues. A living will does not give instructions for distributing your money and property if you should pass away. DNR or DNAR A DNR or DNAR order is a request not to have CPR in  the event that your heart stops beating or you stop breathing. If a DNR or DNAR order has not been made and shared, a health care provider will try to help any patient whose heart has stopped or who has stopped breathing. If you plan to have surgery, talk with your health care provider about how your DNR or DNAR order will be followed if problems occur. What if I do not have an advance directive? Some states assign family decision makers to act on your behalf if you do not have an advance directive.  Each state has its own laws about advance directives. You may want to check with your health care provider, attorney, or state representative about the laws in your state. Summary  Advance directives are legal documents that allow you to make decisions about your health care and medical treatment in case you become unable to communicate for yourself.  The process of discussing and writing advance directives should happen over time. You can change and update advance directives at any time.  Advance directives may include a medical power of attorney, a living will, and a DNR or DNAR order. This information is not intended to replace advice given to you by your health care provider. Make sure you discuss any questions you have with your health care provider. Document Revised: 11/30/2019 Document Reviewed: 11/30/2019 Elsevier Patient Education  2021 Elsevier Inc. Preventive Care 63-14 Years Old, Female Preventive care refers to lifestyle choices and visits with your health care provider that can promote health and wellness. This includes:  A yearly physical exam. This is also called an annual wellness visit.  Regular dental and eye exams.  Immunizations.  Screening for certain conditions.  Healthy lifestyle choices, such as: ? Eating a healthy diet. ? Getting regular exercise. ? Not using drugs or products that contain nicotine and tobacco. ? Limiting alcohol use. What can I expect for my preventive care visit? Physical exam Your health care provider will check your:  Height and weight. These may be used to calculate your BMI (body mass index). BMI is a measurement that tells if you are at a healthy weight.  Heart rate and blood pressure.  Body temperature.  Skin for abnormal spots. Counseling Your health care provider may ask you questions about your:  Past medical problems.  Family's medical history.  Alcohol, tobacco, and drug use.  Emotional well-being.  Home life  and relationship well-being.  Sexual activity.  Diet, exercise, and sleep habits.  Work and work Statistician.  Access to firearms.  Method of birth control.  Menstrual cycle.  Pregnancy history. What immunizations do I need? Vaccines are usually given at various ages, according to a schedule. Your health care provider will recommend vaccines for you based on your age, medical history, and lifestyle or other factors, such as travel or where you work.   What tests do I need? Blood tests  Lipid and cholesterol levels. These may be checked every 5 years, or more often if you are over 79 years old.  Hepatitis C test.  Hepatitis B test. Screening  Lung cancer screening. You may have this screening every year starting at age 33 if you have a 30-pack-year history of smoking and currently smoke or have quit within the past 15 years.  Colorectal cancer screening. ? All adults should have this screening starting at age 7 and continuing until age 77. ? Your health care provider may recommend screening at age 46 if you are at increased risk. ?  You will have tests every 1-10 years, depending on your results and the type of screening test.  Diabetes screening. ? This is done by checking your blood sugar (glucose) after you have not eaten for a while (fasting). ? You may have this done every 1-3 years.  Mammogram. ? This may be done every 1-2 years. ? Talk with your health care provider about when you should start having regular mammograms. This may depend on whether you have a family history of breast cancer.  BRCA-related cancer screening. This may be done if you have a family history of breast, ovarian, tubal, or peritoneal cancers.  Pelvic exam and Pap test. ? This may be done every 3 years starting at age 31. ? Starting at age 23, this may be done every 5 years if you have a Pap test in combination with an HPV test. Other tests  STD (sexually transmitted disease) testing, if  you are at risk.  Bone density scan. This is done to screen for osteoporosis. You may have this scan if you are at high risk for osteoporosis. Talk with your health care provider about your test results, treatment options, and if necessary, the need for more tests. Follow these instructions at home: Eating and drinking  Eat a diet that includes fresh fruits and vegetables, whole grains, lean protein, and low-fat dairy products.  Take vitamin and mineral supplements as recommended by your health care provider.  Do not drink alcohol if: ? Your health care provider tells you not to drink. ? You are pregnant, may be pregnant, or are planning to become pregnant.  If you drink alcohol: ? Limit how much you have to 0-1 drink a day. ? Be aware of how much alcohol is in your drink. In the [place] one drink equals one 12 oz bottle of beer (355 mL), one 5 oz glass of wine (148 mL), or one 1 oz glass of hard liquor (44 mL).   Lifestyle  Take daily care of your teeth and gums. Brush your teeth every morning and night with fluoride toothpaste. Floss one time each day.  Stay active. Exercise for at least 30 minutes 5 or more days each week.  Do not use any products that contain nicotine or tobacco, such as cigarettes, e-cigarettes, and chewing tobacco. If you need help quitting, ask your health care provider.  Do not use drugs.  If you are sexually active, practice safe sex. Use a condom or other form of protection to prevent STIs (sexually transmitted infections).  If you do not wish to become pregnant, use a form of birth control. If you plan to become pregnant, see your health care provider for a prepregnancy visit.  If told by your health care provider, take low-dose aspirin daily starting at age 64.  Find healthy ways to cope with stress, such as: ? Meditation, yoga, or listening to music. ? Journaling. ? Talking to a trusted person. ? Spending time with friends and  family. Safety  Always wear your seat belt while driving or riding in a vehicle.  Do not drive: ? If you have been drinking alcohol. Do not ride with someone who has been drinking. ? When you are tired or distracted. ? While texting.  Wear a helmet and other protective equipment during sports activities.  If you have firearms in your house, make sure you follow all gun safety procedures. What's next?  Visit your health care provider once a year for an annual wellness visit.  Ask  your health care provider how often you should have your eyes and teeth checked.  Stay up to date on all vaccines. This information is not intended to replace advice given to you by your health care provider. Make sure you discuss any questions you have with your health care provider. Document Revised: 11/30/2019 Document Reviewed: 11/06/2017 Elsevier Patient Education  2021 [employer].

## 2020-08-16 ENCOUNTER — Other Ambulatory Visit: Payer: Self-pay

## 2020-08-16 DIAGNOSIS — R944 Abnormal results of kidney function studies: Secondary | ICD-10-CM

## 2020-08-16 LAB — COMPREHENSIVE METABOLIC PANEL
ALT: 17 IU/L (ref 0–32)
AST: 21 IU/L (ref 0–40)
Albumin/Globulin Ratio: 1.7 (ref 1.2–2.2)
Albumin: 4.4 g/dL (ref 3.8–4.9)
Alkaline Phosphatase: 77 IU/L (ref 44–121)
BUN/Creatinine Ratio: 9 (ref 9–23)
BUN: 11 mg/dL (ref 6–24)
Bilirubin Total: 0.4 mg/dL (ref 0.0–1.2)
CO2: 23 mmol/L (ref 20–29)
Calcium: 9.3 mg/dL (ref 8.7–10.2)
Chloride: 101 mmol/L (ref 96–106)
Creatinine, Ser: 1.21 mg/dL — ABNORMAL HIGH (ref 0.57–1.00)
Globulin, Total: 2.6 g/dL (ref 1.5–4.5)
Glucose: 96 mg/dL (ref 65–99)
Potassium: 4.2 mmol/L (ref 3.5–5.2)
Sodium: 136 mmol/L (ref 134–144)
Total Protein: 7 g/dL (ref 6.0–8.5)
eGFR: 54 mL/min/{1.73_m2} — ABNORMAL LOW (ref >59)

## 2020-08-16 LAB — LIPID PANEL
Chol/HDL Ratio: 2.9 ratio (ref 0.0–4.4)
Cholesterol, Total: 141 mg/dL (ref 100–199)
HDL: 48 mg/dL (ref >39)
LDL Chol Calc (NIH): 76 mg/dL (ref 0–99)
Triglycerides: 90 mg/dL (ref 0–149)
VLDL Cholesterol Cal: 17 mg/dL (ref 5–40)

## 2020-08-16 LAB — CBC WITH DIFFERENTIAL/PLATELET
Basophils Absolute: 0.1 10*3/uL (ref 0.0–0.2)
Basos: 1 %
EOS (ABSOLUTE): 0.1 10*3/uL (ref 0.0–0.4)
Eos: 1 %
Hematocrit: 40.2 % (ref 34.0–46.6)
Hemoglobin: 12.9 g/dL (ref 11.1–15.9)
Immature Grans (Abs): 0 10*3/uL (ref 0.0–0.1)
Immature Granulocytes: 1 %
Lymphocytes Absolute: 2.3 10*3/uL (ref 0.7–3.1)
Lymphs: 30 %
MCH: 29.1 pg (ref 26.6–33.0)
MCHC: 32.1 g/dL (ref 31.5–35.7)
MCV: 91 fL (ref 79–97)
Monocytes Absolute: 0.5 10*3/uL (ref 0.1–0.9)
Monocytes: 7 %
Neutrophils Absolute: 4.7 10*3/uL (ref 1.4–7.0)
Neutrophils: 60 %
Platelets: 286 10*3/uL (ref 150–450)
RBC: 4.43 x10E6/uL (ref 3.77–5.28)
RDW: 12.2 % (ref 11.7–15.4)
WBC: 7.8 10*3/uL (ref 3.4–10.8)

## 2020-08-16 LAB — TSH: TSH: 1.11 u[IU]/mL (ref 0.450–4.500)

## 2020-08-16 LAB — CARDIOVASCULAR RISK ASSESSMENT

## 2020-08-25 ENCOUNTER — Other Ambulatory Visit: Payer: Self-pay

## 2020-08-25 MED ORDER — AMPHETAMINE-DEXTROAMPHET ER 30 MG PO CP24: 30.0000 mg | ORAL_CAPSULE | Freq: Every day | ORAL | 0 refills | Status: DC

## 2020-08-25 MED ORDER — AMPHETAMINE-DEXTROAMPHETAMINE 20 MG PO TABS: 20.0000 mg | ORAL_TABLET | Freq: Every day | ORAL | 0 refills | Status: DC

## 2020-09-04 ENCOUNTER — Other Ambulatory Visit: Payer: Self-pay

## 2020-09-04 ENCOUNTER — Ambulatory Visit
Admission: RE | Admit: 2020-09-04 | Discharge: 2020-09-04 | Disposition: A | Discharge status: Home / Self Care | Payer: BC Managed Care – PPO | Source: Ambulatory Visit | Attending: Family Medicine | Admitting: Family Medicine

## 2020-09-13 ENCOUNTER — Other Ambulatory Visit: Payer: BC Managed Care – PPO

## 2020-09-20 ENCOUNTER — Other Ambulatory Visit: Payer: Self-pay

## 2020-09-20 MED ORDER — AMPHETAMINE-DEXTROAMPHETAMINE 20 MG PO TABS: 20.0000 mg | ORAL_TABLET | Freq: Every day | ORAL | 0 refills | Status: DC

## 2020-09-20 MED ORDER — AMPHETAMINE-DEXTROAMPHET ER 30 MG PO CP24: 30.0000 mg | ORAL_CAPSULE | Freq: Every day | ORAL | 0 refills | Status: DC

## 2020-10-15 ENCOUNTER — Other Ambulatory Visit: Payer: Self-pay | Admitting: Family Medicine

## 2020-10-16 ENCOUNTER — Other Ambulatory Visit: Payer: Self-pay | Admitting: Family Medicine

## 2020-10-16 ENCOUNTER — Other Ambulatory Visit: Payer: Self-pay | Admitting: Physician Assistant

## 2020-10-16 MED ORDER — AMPHETAMINE-DEXTROAMPHETAMINE 20 MG PO TABS: 20.0000 mg | ORAL_TABLET | Freq: Every day | ORAL | 0 refills | Status: DC

## 2020-10-16 MED ORDER — AMPHETAMINE-DEXTROAMPHET ER 30 MG PO CP24: 30.0000 mg | ORAL_CAPSULE | Freq: Every day | ORAL | 0 refills | Status: DC

## 2020-10-26 ENCOUNTER — Other Ambulatory Visit: Payer: Self-pay | Admitting: Family Medicine

## 2020-11-03 ENCOUNTER — Other Ambulatory Visit: Payer: Self-pay | Admitting: Physician Assistant

## 2020-11-15 ENCOUNTER — Other Ambulatory Visit: Payer: Self-pay

## 2020-11-15 MED ORDER — AMPHETAMINE-DEXTROAMPHET ER 30 MG PO CP24: 30.0000 mg | ORAL_CAPSULE | Freq: Every day | ORAL | 0 refills | Status: DC

## 2020-11-15 MED ORDER — AMPHETAMINE-DEXTROAMPHETAMINE 20 MG PO TABS: 20.0000 mg | ORAL_TABLET | Freq: Every day | ORAL | 0 refills | Status: DC

## 2020-11-21 ENCOUNTER — Ambulatory Visit: Payer: BC Managed Care – PPO | Admitting: Family Medicine

## 2020-11-30 ENCOUNTER — Other Ambulatory Visit: Payer: Self-pay

## 2020-11-30 ENCOUNTER — Ambulatory Visit: Payer: BC Managed Care – PPO | Admitting: Family Medicine

## 2020-11-30 ENCOUNTER — Encounter: Payer: Self-pay | Admitting: Family Medicine

## 2020-11-30 VITALS — BP 124/72 | HR 76 | Temp 97.7°F | Resp 18 | Ht 63.0 in | Wt 237.0 lb

## 2020-11-30 DIAGNOSIS — I10 Essential (primary) hypertension: Secondary | ICD-10-CM | POA: Diagnosis not present

## 2020-11-30 DIAGNOSIS — Z114 Encounter for screening for human immunodeficiency virus [HIV]: Secondary | ICD-10-CM | POA: Insufficient documentation

## 2020-11-30 DIAGNOSIS — Z1211 Encounter for screening for malignant neoplasm of colon: Secondary | ICD-10-CM | POA: Insufficient documentation

## 2020-11-30 DIAGNOSIS — Z1159 Encounter for screening for other viral diseases: Secondary | ICD-10-CM | POA: Insufficient documentation

## 2020-11-30 DIAGNOSIS — F3112 Bipolar disorder, current episode manic without psychotic features, moderate: Secondary | ICD-10-CM

## 2020-11-30 DIAGNOSIS — E782 Mixed hyperlipidemia: Secondary | ICD-10-CM | POA: Diagnosis not present

## 2020-11-30 DIAGNOSIS — F902 Attention-deficit hyperactivity disorder, combined type: Secondary | ICD-10-CM | POA: Insufficient documentation

## 2020-11-30 DIAGNOSIS — Z90710 Acquired absence of both cervix and uterus: Secondary | ICD-10-CM

## 2020-11-30 DIAGNOSIS — Z6841 Body Mass Index (BMI) 40.0 and over, adult: Secondary | ICD-10-CM

## 2020-11-30 NOTE — Assessment & Plan Note (Signed)
Well controlled.  ?No changes to medicines.  ?Continue to work on eating a healthy diet and exercise.  ?Labs drawn today.  ?

## 2020-11-30 NOTE — Assessment & Plan Note (Signed)
Recommend continue to work on eating healthy diet and exercise.  

## 2020-11-30 NOTE — Progress Notes (Signed)
Subjective:  Patient ID: Anna [name], female    DOB: [birth year]  Age: 50 y.o. MRN: [id number]  Chief Complaint  Patient presents with   Hypertension   Hyperlipidemia    HPI Hyperlipidemia: Current medications: pravastatin and Lovaza.  Hypertension: Current medications: azor. Bp has been good  Diet: healthy Exercise: some  ADD: on adderal xr 30 mg once a day in am and adderall 20 mg daily in early evening before classes and meetings. This works well for pt.   Bipolar, depression: well controlled. On vraylar, hydroxyzine prn, paxil cr, trazodone, and xanax. Denies depression.  Current Outpatient Medications on File Prior to Visit  Medication Sig Dispense Refill   ALPRAZolam (XANAX) 0.5 MG tablet TAKE 1 TABLET BY MOUTH DAILY AS NEEDED FOR SEVERE ANXIETY 30 tablet 3   amLODipine-olmesartan (AZOR) 5-20 MG tablet TAKE 1 TABLET BY MOUTH EVERY DAY 90 tablet 0   amphetamine-dextroamphetamine (ADDERALL XR) 30 MG 24 hr capsule Take 1 capsule (30 mg total) by mouth daily. 30 capsule 0   amphetamine-dextroamphetamine (ADDERALL) 20 MG tablet Take 1 tablet (20 mg total) by mouth daily. 30 tablet 0   hydrOXYzine (ATARAX/VISTARIL) 50 MG tablet Take 1 tablet (50 mg total) by mouth every 6 (six) hours as needed for anxiety. 60 tablet 0   omega-3 acid ethyl esters (LOVAZA) 1 g capsule TAKE 1 CAPSULE BY MOUTH TWICE A DAY 180 capsule 0   PARoxetine (PAXIL-CR) 37.5 MG 24 hr tablet TAKE 1 TABLET BY MOUTH EVERY DAY 90 tablet 1   pravastatin (PRAVACHOL) 40 MG tablet TAKE 1 TABLET BY MOUTH EVERY DAY 90 tablet 1   traZODone (DESYREL) 50 MG tablet TAKE 1 TABLET BY MOUTH EVERYDAY AT BEDTIME 90 tablet 3   VRAYLAR 3 MG capsule TAKE 1 CAPSULE BY ORAL ROUTE ONCE DAILY 90 capsule 1   No current facility-administered medications on file prior to visit.   Past Medical History:  Diagnosis Date   Arthritis    Asthma    seasonal   GERD (gastroesophageal reflux disease)    occ   History of recurrent UTIs 1/14    Hypertension    Past Surgical History:  Procedure Laterality Date   ABDOMINAL HYSTERECTOMY  2010   endometriosis. Total hysterectomy   ANTERIOR CERVICAL DECOMP/DISCECTOMY FUSION N/A 07/13/2012   Procedure: ANTERIOR CERVICAL DECOMPRESSION/DISCECTOMY FUSION 1 LEVEL;  Surgeon: Mariam Dollar, MD;  Location: [facility];  Service: Neurosurgery;  Laterality: N/A;  Anterior Cervical Decompression/Discectomy Cervical Three-Four   CARPAL TUNNEL RELEASE Right    CERVICAL DISC SURGERY  09   CHOLECYSTECTOMY      Family History  Problem Relation Age of Onset   Breast cancer Neg Hx    Social History   Socioeconomic History   Marital status: Married    Spouse name: Not on file   Number of children: Not on file   Years of education: Not on file   Highest education level: Not on file  Occupational History   Not on file  Tobacco Use   Smoking status: Never   Smokeless tobacco: Never  Vaping Use   Vaping Use: Never used  Substance and Sexual Activity   Alcohol use: No   Drug use: No    Comment: 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  HENT:  Negative for congestion, rhinorrhea and sore throat.   Respiratory:  Negative for cough and shortness of breath.   Cardiovascular:  Negative for chest pain.  Gastrointestinal:  Negative for abdominal pain, constipation, diarrhea, nausea and vomiting.  Genitourinary:  Negative for dysuria and urgency.  Musculoskeletal:  Negative for back pain and myalgias.  Neurological:  Negative for dizziness, weakness, light-headedness and headaches.   Psychiatric/Behavioral:  Negative for dysphoric mood. The patient is not nervous/anxious.     Objective:  BP 124/72   Pulse 76   Temp 97.7 F (36.5 C)   Resp 18   Ht 5\' 3"  (1.6 m)   Wt 237 lb (107.5 kg)   BMI 41.98 kg/m   BP/Weight 11/30/2020 08/15/2020 06/21/2020  Systolic BP 124 130 -  Diastolic BP 72 78 -  Wt. (Lbs) 237 235 245  BMI 41.98 41.63 42.05  Some encounter information is confidential and restricted. Go to Review Flowsheets activity to see all data.    Physical Exam Vitals reviewed.  Constitutional:      Appearance: Normal appearance. She is normal weight.  Neck:     Vascular: No carotid bruit.  Cardiovascular:     Rate and Rhythm: Normal rate and regular rhythm.     Heart sounds: Normal heart sounds.  Pulmonary:     Effort: Pulmonary effort is normal. No respiratory distress.     Breath sounds: Normal breath sounds.  Abdominal:     General: Abdomen is flat. Bowel sounds are normal.     Palpations: Abdomen is soft.     Tenderness: There is no abdominal tenderness.  Neurological:     Mental Status: She is alert and oriented to person, place, and time.  Psychiatric:        Mood and Affect: Mood normal.        Behavior: Behavior normal.    Diabetic Foot Exam - Simple   No data filed      Lab Results  Component Value Date   WBC 7.8 08/15/2020   HGB 12.9 08/15/2020   HCT 40.2 08/15/2020   PLT 286 08/15/2020   GLUCOSE 96 08/15/2020   CHOL 141 08/15/2020   TRIG 90 08/15/2020   HDL 48 08/15/2020   LDLCALC 76 08/15/2020   ALT 17 08/15/2020   AST 21 08/15/2020   NA 136 08/15/2020   K 4.2 08/15/2020   CL 101 08/15/2020   CREATININE 1.21 (H) 08/15/2020   BUN 11 08/15/2020   CO2 23 08/15/2020   TSH 1.110 08/15/2020   HGBA1C 5.3 08/25/2017      Assessment & Plan:   Problem List Items Addressed This Visit       Cardiovascular and Mediastinum   Essential hypertension, benign - Primary    .Well controlled.  No changes to medicines.   Continue to work on eating a healthy diet and exercise.  Labs drawn today.        Relevant Orders   CBC with Differential/Platelet   Comprehensive metabolic panel     Other   Bipolar I disorder, most recent episode (or current) manic, moderate (HCC)    The current medical regimen is effective;  continue present plan and medications.  Obesity    Recommend continue to work on eating healthy diet and exercise.       Mixed hyperlipidemia    Well controlled.  No changes to medicines.  Continue to work on eating a healthy diet and exercise.  Labs drawn today.        Relevant Orders   Lipid panel   Attention deficit hyperactivity disorder (ADHD), combined type   Colon cancer screening   Relevant Orders   Cologuard   History of total hysterectomy   Encounter for screening for HIV   Relevant Orders   HIV Antibody (routine testing w rflx)   Need for hepatitis C screening test   Relevant Orders   HCV Ab w Reflex to Quant PCR  .  No orders of the defined types were placed in this encounter.   Orders Placed This Encounter  Procedures   CBC with Differential/Platelet   Comprehensive metabolic panel   Lipid panel   HCV Ab w Reflex to Quant PCR   HIV Antibody (routine testing w rflx)   Cologuard     Follow-up: Return in about 3 months (around 03/01/2021) for chronic fasting.  An After Visit Summary was printed and given to the patient.  Blane Ohara, MD [facility] [phone]

## 2020-11-30 NOTE — Assessment & Plan Note (Signed)
The current medical regimen is effective;  continue present plan and medications.  

## 2020-12-01 LAB — CBC WITH DIFFERENTIAL/PLATELET
Basophils Absolute: 0.1 10*3/uL (ref 0.0–0.2)
Basos: 1 %
EOS (ABSOLUTE): 0.1 10*3/uL (ref 0.0–0.4)
Eos: 2 %
Hematocrit: 42.8 % (ref 34.0–46.6)
Hemoglobin: 14.1 g/dL (ref 11.1–15.9)
Immature Grans (Abs): 0 10*3/uL (ref 0.0–0.1)
Immature Granulocytes: 1 %
Lymphocytes Absolute: 2.2 10*3/uL (ref 0.7–3.1)
Lymphs: 33 %
MCH: 29.1 pg (ref 26.6–33.0)
MCHC: 32.9 g/dL (ref 31.5–35.7)
MCV: 88 fL (ref 79–97)
Monocytes Absolute: 0.4 10*3/uL (ref 0.1–0.9)
Monocytes: 7 %
Neutrophils Absolute: 3.8 10*3/uL (ref 1.4–7.0)
Neutrophils: 56 %
Platelets: 302 10*3/uL (ref 150–450)
RBC: 4.84 x10E6/uL (ref 3.77–5.28)
RDW: 12.4 % (ref 11.7–15.4)
WBC: 6.7 10*3/uL (ref 3.4–10.8)

## 2020-12-01 LAB — COMPREHENSIVE METABOLIC PANEL
ALT: 27 IU/L (ref 0–32)
AST: 27 IU/L (ref 0–40)
Albumin/Globulin Ratio: 1.8 (ref 1.2–2.2)
Albumin: 4.5 g/dL (ref 3.8–4.9)
Alkaline Phosphatase: 91 IU/L (ref 44–121)
BUN/Creatinine Ratio: 8 — ABNORMAL LOW (ref 9–23)
BUN: 8 mg/dL (ref 6–24)
Bilirubin Total: 0.4 mg/dL (ref 0.0–1.2)
CO2: 21 mmol/L (ref 20–29)
Calcium: 9.1 mg/dL (ref 8.7–10.2)
Chloride: 104 mmol/L (ref 96–106)
Creatinine, Ser: 1.06 mg/dL — ABNORMAL HIGH (ref 0.57–1.00)
Globulin, Total: 2.5 g/dL (ref 1.5–4.5)
Glucose: 117 mg/dL — ABNORMAL HIGH (ref 65–99)
Potassium: 4.2 mmol/L (ref 3.5–5.2)
Sodium: 141 mmol/L (ref 134–144)
Total Protein: 7 g/dL (ref 6.0–8.5)
eGFR: 64 mL/min/{1.73_m2} (ref >59)

## 2020-12-01 LAB — HCV AB W REFLEX TO QUANT PCR: HCV Ab: <0.1 s/co ratio (ref 0.0–0.9)

## 2020-12-01 LAB — LIPID PANEL
Chol/HDL Ratio: 3.2 ratio (ref 0.0–4.4)
Cholesterol, Total: 163 mg/dL (ref 100–199)
HDL: 51 mg/dL (ref >39)
LDL Chol Calc (NIH): 97 mg/dL (ref 0–99)
Triglycerides: 80 mg/dL (ref 0–149)
VLDL Cholesterol Cal: 15 mg/dL (ref 5–40)

## 2020-12-01 LAB — CARDIOVASCULAR RISK ASSESSMENT

## 2020-12-01 LAB — HCV INTERPRETATION

## 2020-12-01 LAB — HIV ANTIBODY (ROUTINE TESTING W REFLEX): HIV Screen 4th Generation wRfx: NONREACTIVE

## 2020-12-07 ENCOUNTER — Telehealth (INDEPENDENT_AMBULATORY_CARE_PROVIDER_SITE_OTHER): Payer: BC Managed Care – PPO | Admitting: Family Medicine

## 2020-12-07 ENCOUNTER — Encounter: Payer: Self-pay | Admitting: Family Medicine

## 2020-12-07 DIAGNOSIS — J988 Other specified respiratory disorders: Secondary | ICD-10-CM | POA: Diagnosis not present

## 2020-12-07 DIAGNOSIS — U071 COVID-19: Secondary | ICD-10-CM

## 2020-12-07 MED ORDER — HYDROCODONE BIT-HOMATROP MBR 5-1.5 MG/5ML PO SOLN
5.0000 mL | Freq: Four times a day (QID) | ORAL | 0 refills | Status: DC | PRN
Start: 2020-12-07 — End: 2021-03-07

## 2020-12-07 MED ORDER — NIRMATRELVIR/RITONAVIR (PAXLOVID)TABLET: 3.0000 | ORAL_TABLET | Freq: Two times a day (BID) | ORAL | 0 refills | Status: AC

## 2020-12-07 NOTE — Progress Notes (Signed)
Virtual Visit via Video Note   This visit type was conducted due to national recommendations for restrictions regarding the COVID-19 Pandemic (e.g. social distancing) in an effort to limit this patient's exposure and mitigate transmission in our community.  Due to her co-morbid illnesses, this patient is at least at moderate risk for complications without adequate follow up.  This format is felt to be most appropriate for this patient at this time.  All issues noted in this document were discussed and addressed.  A limited physical exam was performed with this format.  A verbal consent was obtained for the virtual visit.   Date:  12/07/2020   ID:  Anna [name], DOB 25-Sep-1970, MRN [id number]  Patient Location: Home Provider Location: Office/Clinic  PCP:  Blane Ohara, MD   Evaluation Performed:  acute  Chief Complaint:  sinus drainage  History of Present Illness:    Anna [name] is a 50 y.o. female with sore throat, sinus drainage, coughing, and headaches for 2 days.  Patient complains of a left mild earache.  Patient is also having cough and shortness of breath.  No nausea or throwing up.    Past Medical History:  Diagnosis Date   Arthritis    Asthma    seasonal   GERD (gastroesophageal reflux disease)    occ   History of recurrent UTIs 1/14   Hypertension     Past Surgical History:  Procedure Laterality Date   ABDOMINAL HYSTERECTOMY  2010   endometriosis. Total hysterectomy   ANTERIOR CERVICAL DECOMP/DISCECTOMY FUSION N/A 07/13/2012   Procedure: ANTERIOR CERVICAL DECOMPRESSION/DISCECTOMY FUSION 1 LEVEL;  Surgeon: Mariam Dollar, MD;  Location: [facility];  Service: Neurosurgery;  Laterality: N/A;  Anterior Cervical Decompression/Discectomy Cervical Three-Four   CARPAL TUNNEL RELEASE Right    CERVICAL DISC SURGERY  09   CHOLECYSTECTOMY      Family History  Problem Relation Age of Onset   Breast cancer Neg Hx     Social History   Socioeconomic History   Marital  status: Married    Spouse name: Not on file   Number of children: Not on file   Years of education: Not on file   Highest education level: Not on file  Occupational History   Not on file  Tobacco Use   Smoking status: Never   Smokeless tobacco: Never  Vaping Use   Vaping Use: Never used  Substance and Sexual Activity   Alcohol use: No   Drug use: No    Comment: 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   Sexual activity: Not on file  Other Topics Concern   Not on file  Social History Narrative   Not on file   Social Determinants of Health   Financial Resource Strain: Not on file  Food Insecurity: Not on file  Transportation Needs: Not on file  Physical Activity: Not on file  Stress: Not on file  Social Connections: Not on file  Intimate Partner Violence: Not on file    Outpatient Medications Prior to Visit  Medication Sig Dispense Refill   ALPRAZolam (XANAX) 0.5 MG tablet TAKE 1 TABLET BY MOUTH DAILY AS NEEDED FOR SEVERE ANXIETY 30 tablet 3   amLODipine-olmesartan (AZOR) 5-20 MG tablet TAKE 1 TABLET BY MOUTH EVERY DAY 90 tablet 0   amphetamine-dextroamphetamine (ADDERALL XR) 30 MG 24 hr capsule Take 1 capsule (30 mg total) by mouth daily. 30 capsule 0   amphetamine-dextroamphetamine (ADDERALL) 20 MG tablet Take 1 tablet (20 mg total) by  mouth daily. 30 tablet 0   hydrOXYzine (ATARAX/VISTARIL) 50 MG tablet Take 1 tablet (50 mg total) by mouth every 6 (six) hours as needed for anxiety. 60 tablet 0   omega-3 acid ethyl esters (LOVAZA) 1 g capsule TAKE 1 CAPSULE BY MOUTH TWICE A DAY 180 capsule 0   PARoxetine (PAXIL-CR) 37.5 MG 24 hr tablet TAKE 1 TABLET BY MOUTH EVERY DAY 90 tablet 1   pravastatin (PRAVACHOL) 40 MG tablet TAKE 1 TABLET BY MOUTH EVERY DAY 90 tablet 1   traZODone (DESYREL) 50 MG tablet TAKE 1 TABLET BY MOUTH EVERYDAY AT BEDTIME 90 tablet 3   VRAYLAR 3 MG capsule TAKE 1 CAPSULE BY ORAL ROUTE ONCE DAILY 90 capsule 1   No facility-administered medications prior to  visit.    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  HENT:  Positive for congestion, ear pain and sore throat. Negative for sinus pain.   Respiratory:  Positive for cough and shortness of breath.   Cardiovascular:  Negative for chest pain.  Musculoskeletal:  Negative for myalgias.  Neurological:  Positive for headaches.    Labs/Other Tests and Data Reviewed:    Recent Labs: 08/15/2020: TSH 1.110 11/30/2020: ALT 27; BUN 8; Creatinine, Ser 1.06; Hemoglobin 14.1; Platelets 302; Potassium 4.2; Sodium 141   Recent Lipid Panel Lab Results  Component Value Date/Time   CHOL 163 11/30/2020 07:54 AM   TRIG 80 11/30/2020 07:54 AM   HDL 51 11/30/2020 07:54 AM   CHOLHDL 3.2 11/30/2020 07:54 AM   CHOLHDL 3.7 08/25/2017 06:29 AM   LDLCALC 97 11/30/2020 07:54 AM    Wt Readings from Last 3 Encounters:  11/30/20 237 lb (107.5 kg)  08/15/20 235 lb (106.6 kg)  06/21/20 245 lb (111.1 kg)     Objective:    Vital Signs:  There were no vitals taken for this visit.   Physical Exam Constitutional:      Appearance: She is ill-appearing.  Neurological:     Mental Status: She is alert.    Looks tired.  ASSESSMENT & PLAN:   1. Respiratory tract infection due to COVID-19 virus   Your COVID test is positive. You should remain isolated and quarantine for at least 5 days from start of symptoms. You must be feeling better and be fever free without any fever reducers for at least 24 hours as well. You should wear a mask at all times when out of your home or around others for 5 days after leaving isolation.  Your household contacts should be tested as  well as work contacts. If you feel worse or have increasing shortness of breath, you should be seen in person at urgent care or the emergency room.    Recommend increase fluids, eat three meals per day, rest.  Treat with paxlovid, mucinex, hydromet.  Meds ordered this encounter  Medications   nirmatrelvir/ritonavir EUA (PAXLOVID) 20 x 150 MG & 10 x 100MG  TABS    Sig: Take 3 tablets by mouth 2 (two) times daily for 5 days. (Take nirmatrelvir 150 mg two tablets twice daily for 5  days and ritonavir 100 mg one tablet twice daily for 5 days) Patient GFR is 64    Dispense:  30 tablet    Refill:  0   HYDROcodone bit-homatropine (HYDROMET) 5-1.5 MG/5ML syrup    Sig: Take 5 mLs by mouth every 6 (six) hours as needed for cough.    Dispense:  120 mL    Refill:  0    I spent 10 minutes dedicated to the care of this patient on the date of this encounter to include face-to-face time with the patient. Follow Up:  Virtual Visit  prn  Signed, Blane Ohara, MD  12/07/2020 3:02 PM    [facility]

## 2020-12-13 ENCOUNTER — Other Ambulatory Visit: Payer: Self-pay

## 2020-12-13 MED ORDER — AMPHETAMINE-DEXTROAMPHET ER 30 MG PO CP24: 30.0000 mg | ORAL_CAPSULE | Freq: Every day | ORAL | 0 refills | Status: DC

## 2020-12-13 MED ORDER — AMPHETAMINE-DEXTROAMPHETAMINE 20 MG PO TABS: 20.0000 mg | ORAL_TABLET | Freq: Every day | ORAL | 0 refills | Status: DC

## 2021-01-10 ENCOUNTER — Other Ambulatory Visit: Payer: Self-pay

## 2021-01-10 MED ORDER — AMPHETAMINE-DEXTROAMPHET ER 30 MG PO CP24: 30.0000 mg | ORAL_CAPSULE | Freq: Every day | ORAL | 0 refills | Status: DC

## 2021-01-10 MED ORDER — AMPHETAMINE-DEXTROAMPHETAMINE 20 MG PO TABS: 20.0000 mg | ORAL_TABLET | Freq: Every day | ORAL | 0 refills | Status: DC

## 2021-01-11 ENCOUNTER — Other Ambulatory Visit: Payer: Self-pay | Admitting: Family Medicine

## 2021-01-11 NOTE — Telephone Encounter (Signed)
Refill sent to pharmacy.   

## 2021-01-12 ENCOUNTER — Other Ambulatory Visit: Payer: Self-pay | Admitting: Family Medicine

## 2021-01-25 ENCOUNTER — Telehealth: Payer: Self-pay

## 2021-01-25 ENCOUNTER — Other Ambulatory Visit: Payer: Self-pay | Admitting: Family Medicine

## 2021-01-25 MED ORDER — AMPHETAMINE-DEXTROAMPHETAMINE 30 MG PO TABS: 30.0000 mg | ORAL_TABLET | Freq: Two times a day (BID) | ORAL | 0 refills | Status: DC

## 2021-01-25 NOTE — Telephone Encounter (Signed)
Patient calling as she has called 4-5 pharmacies for supply of adderall 30 mg and cannot find a supply. Does not know what she can do.   Please advise.   Lorita Officer, [place] 01/25/21 8:58 AM

## 2021-01-27 ENCOUNTER — Other Ambulatory Visit: Payer: Self-pay | Admitting: Family Medicine

## 2021-01-27 MED ORDER — LISDEXAMFETAMINE DIMESYLATE 40 MG PO CAPS: 40.0000 mg | ORAL_CAPSULE | ORAL | 0 refills | Status: DC

## 2021-02-11 ENCOUNTER — Other Ambulatory Visit: Payer: Self-pay | Admitting: Physician Assistant

## 2021-02-20 ENCOUNTER — Other Ambulatory Visit: Payer: Self-pay

## 2021-02-20 MED ORDER — LISDEXAMFETAMINE DIMESYLATE 40 MG PO CAPS: 40.0000 mg | ORAL_CAPSULE | ORAL | 0 refills | Status: DC

## 2021-03-06 NOTE — Progress Notes (Signed)
Subjective:  Patient ID: Anna [name], female    DOB: 1971-01-26  Age: 50 y.o. MRN: [id number]  Chief Complaint  Patient presents with   Hypertension   Hyperlipidemia   ADHD    HPI: Hyperlipidemia: Current medications: lovaza 1 gm one capsule twice a day and pravastatin 40 mg once daily.  Hypertension: Current medications: on azor 5/20 mg once daily. Doing well.   ADD: on vyvanse 40 mg once daily. Helping with attention. She thinks she would like to try a higher dose.   Bipolar: ON vraylar and paxil cr. Trazodone for sleep prn. Takes xanax once every other day approximately.   Diet: Portions are better.  Exercise: Walks at work   Current Outpatient Medications on File Prior to Visit  Medication Sig Dispense Refill   ALPRAZolam (XANAX) 0.5 MG tablet TAKE 1 TABLET BY MOUTH DAILY AS NEEDED FOR SEVERE ANXIETY 30 tablet 3   amLODipine-olmesartan (AZOR) 5-20 MG tablet TAKE 1 TABLET BY MOUTH EVERY DAY 90 tablet 0   omega-3 acid ethyl esters (LOVAZA) 1 g capsule TAKE 1 CAPSULE BY MOUTH TWICE A DAY 180 capsule 0   PARoxetine (PAXIL-CR) 37.5 MG 24 hr tablet TAKE 1 TABLET BY MOUTH EVERY DAY 90 tablet 1   pravastatin (PRAVACHOL) 40 MG tablet TAKE 1 TABLET BY MOUTH EVERY DAY 90 tablet 1   traZODone (DESYREL) 50 MG tablet TAKE 1 TABLET BY MOUTH EVERYDAY AT BEDTIME 90 tablet 3   VRAYLAR 3 MG capsule TAKE 1 CAPSULE BY ORAL ROUTE ONCE DAILY 90 capsule 1   No current facility-administered medications on file prior to visit.   Past Medical History:  Diagnosis Date   Arthritis    Asthma    seasonal   GERD (gastroesophageal reflux disease)    occ   History of recurrent UTIs 1/14   Hypertension    Past Surgical History:  Procedure Laterality Date   ABDOMINAL HYSTERECTOMY  2010   endometriosis. Total hysterectomy   ANTERIOR CERVICAL DECOMP/DISCECTOMY FUSION N/A 07/13/2012   Procedure: ANTERIOR CERVICAL DECOMPRESSION/DISCECTOMY FUSION 1 LEVEL;  Surgeon: Mariam Dollar, MD;  Location: [facility];  Service: Neurosurgery;  Laterality: N/A;  Anterior Cervical Decompression/Discectomy Cervical Three-Four   CARPAL TUNNEL RELEASE Right    CERVICAL DISC SURGERY  09   CHOLECYSTECTOMY      Family History  Problem Relation Age of Onset   Breast cancer Neg Hx    Social History   Socioeconomic History   Marital status: Married    Spouse name: Not on file   Number of children: Not on file   Years of education: Not on file   Highest education level: Not on file  Occupational History   Not on file  Tobacco Use   Smoking status: Never   Smokeless tobacco: Never  Vaping Use   Vaping Use: Never used  Substance and Sexual Activity   Alcohol use: No   Drug use: No    Comment: 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  HENT:  Negative for congestion, ear pain, sinus pressure and sore throat.   Eyes:  Negative for pain.  Respiratory:  Negative for cough, chest tightness, shortness of breath and wheezing.   Cardiovascular:  Negative for chest pain and palpitations.  Gastrointestinal:  Negative for abdominal pain, constipation, diarrhea, nausea and vomiting.  Genitourinary:  Negative for dysuria and hematuria.  Musculoskeletal:  Negative for arthralgias, back pain, joint swelling and myalgias.  Skin:  Negative for rash.  Neurological:  Negative for dizziness, weakness and headaches.  Psychiatric/Behavioral:  Negative for dysphoric mood. The patient is not nervous/anxious.     Objective:  BP 122/82 (BP Location: Right Arm, Patient Position: Sitting)    Pulse 87     Temp 98.7 F (37.1 C) (Temporal)    Ht 5\' 3"  (1.6 m)    Wt 237 lb (107.5 kg)    SpO2 97%    BMI 41.98 kg/m   BP/Weight 03/07/2021 [id number]  Systolic BP [id number]  Diastolic BP 82 72 78  Wt. (Lbs) 237 237 235  BMI 41.98 41.98 41.63  Some encounter information is confidential and restricted. Go to Review Flowsheets activity to see all data.    Physical Exam Vitals reviewed.  Constitutional:      Appearance: Normal appearance. She is normal weight.  Neck:     Vascular: No carotid bruit.  Cardiovascular:     Rate and Rhythm: Normal rate and regular rhythm.     Pulses: Normal pulses.     Heart sounds: Normal heart sounds.  Pulmonary:     Effort: Pulmonary effort is normal. No respiratory distress.     Breath sounds: Normal breath sounds.  Abdominal:     General: Abdomen is flat. Bowel sounds are normal.     Palpations: Abdomen is soft.     Tenderness: There is no abdominal tenderness.  Neurological:     Mental Status: She is alert and oriented to person, place, and time.  Psychiatric:        Mood and Affect: Mood normal.        Behavior: Behavior normal.    Diabetic Foot Exam - Simple   No data filed      Lab Results  Component Value Date   WBC 7.8 03/07/2021   HGB 13.8 03/07/2021   HCT 40.2 03/07/2021   PLT 295 03/07/2021   GLUCOSE 119 (H) 03/07/2021   CHOL 160 03/07/2021   TRIG 88 03/07/2021   HDL 48 03/07/2021   LDLCALC 95 03/07/2021   ALT 20 03/07/2021   AST 24 03/07/2021   NA 138 03/07/2021   K 4.1 03/07/2021   CL 100 03/07/2021   CREATININE 1.05 (H) 03/07/2021   BUN 11 03/07/2021   CO2 25 03/07/2021   TSH 1.110 08/15/2020   HGBA1C 5.3 08/25/2017      Assessment & Plan:   Problem List Items Addressed This Visit       Cardiovascular and Mediastinum   Essential hypertension, benign - Primary    Well controlled.  No changes to medicines.  Continue to work on eating a healthy diet and exercise.  Labs drawn today.        Relevant  Orders   CBC with Differential/Platelet (Completed)   Comprehensive metabolic panel (Completed)     Other   Bipolar I disorder, most recent episode (or current) manic, moderate (HCC)    The current medical regimen is effective;  continue present plan and medications.       Class  3 severe obesity due to excess calories with serious comorbidity and body mass index (BMI) of 40.0 to 44.9 in adult [facility])    Recommend continue to work on eating healthy diet and exercise.       Relevant Medications   lisdexamfetamine (VYVANSE) 50 MG capsule   Mixed hyperlipidemia    Well controlled.  No changes to medicines.  Continue to work on eating a healthy diet and exercise.  Labs drawn today.        Relevant Orders   Lipid panel (Completed)   Attention deficit hyperactivity disorder (ADHD), combined type    The current medical regimen is effective;  continue present plan and medications.      .  Meds ordered this encounter  Medications   lisdexamfetamine (VYVANSE) 50 MG capsule    Sig: Take 1 capsule (50 mg total) by mouth daily.    Dispense:  30 capsule    Refill:  0    Orders Placed This Encounter  Procedures   CBC with Differential/Platelet   Comprehensive metabolic panel   Lipid panel   Cardiovascular Risk Assessment     Follow-up: Return in about 3 months (around 06/05/2021) for chronic follow up.  An After Visit Summary was printed and given to the patient.   I,Lauren M Auman,acting as a scribe for Rochel Brome, MD.,have documented all relevant documentation on the behalf of Rochel Brome, MD,as directed by  Rochel Brome, MD while in the presence of Rochel Brome, MD.   Rochel Brome, MD [place] [phone]

## 2021-03-07 ENCOUNTER — Encounter: Payer: Self-pay | Admitting: Family Medicine

## 2021-03-07 ENCOUNTER — Ambulatory Visit: Payer: BC Managed Care – PPO | Admitting: Family Medicine

## 2021-03-07 ENCOUNTER — Other Ambulatory Visit: Payer: Self-pay

## 2021-03-07 VITALS — BP 122/82 | HR 87 | Temp 98.7°F | Ht 63.0 in | Wt 237.0 lb

## 2021-03-07 DIAGNOSIS — I10 Essential (primary) hypertension: Secondary | ICD-10-CM

## 2021-03-07 DIAGNOSIS — F3112 Bipolar disorder, current episode manic without psychotic features, moderate: Secondary | ICD-10-CM

## 2021-03-07 DIAGNOSIS — Z6841 Body Mass Index (BMI) 40.0 and over, adult: Secondary | ICD-10-CM

## 2021-03-07 DIAGNOSIS — F902 Attention-deficit hyperactivity disorder, combined type: Secondary | ICD-10-CM | POA: Diagnosis not present

## 2021-03-07 DIAGNOSIS — E782 Mixed hyperlipidemia: Secondary | ICD-10-CM

## 2021-03-07 MED ORDER — LISDEXAMFETAMINE DIMESYLATE 50 MG PO CAPS: 50.0000 mg | ORAL_CAPSULE | Freq: Every day | ORAL | 0 refills | Status: DC

## 2021-03-08 LAB — LIPID PANEL
Chol/HDL Ratio: 3.3 ratio (ref 0.0–4.4)
Cholesterol, Total: 160 mg/dL (ref 100–199)
HDL: 48 mg/dL (ref >39)
LDL Chol Calc (NIH): 95 mg/dL (ref 0–99)
Triglycerides: 88 mg/dL (ref 0–149)
VLDL Cholesterol Cal: 17 mg/dL (ref 5–40)

## 2021-03-08 LAB — CBC WITH DIFFERENTIAL/PLATELET
Basophils Absolute: 0.1 10*3/uL (ref 0.0–0.2)
Basos: 1 %
EOS (ABSOLUTE): 0.2 10*3/uL (ref 0.0–0.4)
Eos: 2 %
Hematocrit: 40.2 % (ref 34.0–46.6)
Hemoglobin: 13.8 g/dL (ref 11.1–15.9)
Immature Grans (Abs): 0 10*3/uL (ref 0.0–0.1)
Immature Granulocytes: 0 %
Lymphocytes Absolute: 2.2 10*3/uL (ref 0.7–3.1)
Lymphs: 28 %
MCH: 29.9 pg (ref 26.6–33.0)
MCHC: 34.3 g/dL (ref 31.5–35.7)
MCV: 87 fL (ref 79–97)
Monocytes Absolute: 0.6 10*3/uL (ref 0.1–0.9)
Monocytes: 8 %
Neutrophils Absolute: 4.8 10*3/uL (ref 1.4–7.0)
Neutrophils: 61 %
Platelets: 295 10*3/uL (ref 150–450)
RBC: 4.61 x10E6/uL (ref 3.77–5.28)
RDW: 12.4 % (ref 11.7–15.4)
WBC: 7.8 10*3/uL (ref 3.4–10.8)

## 2021-03-08 LAB — COMPREHENSIVE METABOLIC PANEL
ALT: 20 IU/L (ref 0–32)
AST: 24 IU/L (ref 0–40)
Albumin/Globulin Ratio: 2 (ref 1.2–2.2)
Albumin: 4.7 g/dL (ref 3.8–4.9)
Alkaline Phosphatase: 99 IU/L (ref 44–121)
BUN/Creatinine Ratio: 10 (ref 9–23)
BUN: 11 mg/dL (ref 6–24)
Bilirubin Total: 0.5 mg/dL (ref 0.0–1.2)
CO2: 25 mmol/L (ref 20–29)
Calcium: 9.4 mg/dL (ref 8.7–10.2)
Chloride: 100 mmol/L (ref 96–106)
Creatinine, Ser: 1.05 mg/dL — ABNORMAL HIGH (ref 0.57–1.00)
Globulin, Total: 2.3 g/dL (ref 1.5–4.5)
Glucose: 119 mg/dL — ABNORMAL HIGH (ref 70–99)
Potassium: 4.1 mmol/L (ref 3.5–5.2)
Sodium: 138 mmol/L (ref 134–144)
Total Protein: 7 g/dL (ref 6.0–8.5)
eGFR: 64 mL/min/{1.73_m2} (ref >59)

## 2021-03-12 NOTE — Assessment & Plan Note (Signed)
Recommend continue to work on eating healthy diet and exercise.  

## 2021-03-12 NOTE — Assessment & Plan Note (Signed)
The current medical regimen is effective;  continue present plan and medications.  

## 2021-03-12 NOTE — Assessment & Plan Note (Signed)
Well controlled.  ?No changes to medicines.  ?Continue to work on eating a healthy diet and exercise.  ?Labs drawn today.  ?

## 2021-03-13 ENCOUNTER — Encounter: Payer: Self-pay | Admitting: Legal Medicine

## 2021-03-13 ENCOUNTER — Ambulatory Visit: Payer: BC Managed Care – PPO | Admitting: Legal Medicine

## 2021-03-13 VITALS — BP 120/70 | HR 97 | Temp 99.1°F | Resp 16 | Ht 63.0 in | Wt 234.0 lb

## 2021-03-13 DIAGNOSIS — J018 Other acute sinusitis: Secondary | ICD-10-CM | POA: Diagnosis not present

## 2021-03-13 LAB — POC COVID19 BINAXNOW: SARS Coronavirus 2 Ag: NEGATIVE

## 2021-03-13 LAB — POCT INFLUENZA A/B
Influenza A, POC: NEGATIVE
Influenza B, POC: NEGATIVE

## 2021-03-13 MED ORDER — CHERATUSSIN AC 100-10 MG/5ML PO SOLN: 5.0000 mL | Freq: Three times a day (TID) | ORAL | 0 refills | Status: DC | PRN

## 2021-03-13 MED ORDER — AZITHROMYCIN 250 MG PO TABS: ORAL_TABLET | ORAL | 0 refills | Status: AC

## 2021-03-13 NOTE — Progress Notes (Signed)
Acute Office Visit  Subjective:    Patient ID: Anna [name], female    DOB: [birth year], 51 y.o.   MRN: [id number]  Chief Complaint  Patient presents with   Sinusitis    HPI: Patient is in today for sinus pain, sinus headache, pos drainage and coughing, sinus pressure since 5 days ago. Patient denied SOB, Chest pain, nauseas, vomiting, abdominal pain.  Past Medical History:  Diagnosis Date   Arthritis    Asthma    seasonal   GERD (gastroesophageal reflux disease)    occ   History of recurrent UTIs 1/14   Hypertension     Past Surgical History:  Procedure Laterality Date   ABDOMINAL HYSTERECTOMY  2010   endometriosis. Total hysterectomy   ANTERIOR CERVICAL DECOMP/DISCECTOMY FUSION N/A 07/13/2012   Procedure: ANTERIOR CERVICAL DECOMPRESSION/DISCECTOMY FUSION 1 LEVEL;  Surgeon: Elaina Hoops, MD;  Location: [facility];  Service: Neurosurgery;  Laterality: N/A;  Anterior Cervical Decompression/Discectomy Cervical Three-Four   CARPAL TUNNEL RELEASE Right    CERVICAL DISC SURGERY  09   CHOLECYSTECTOMY      Family History  Problem Relation Age of Onset   Breast cancer Neg Hx     Social History   Socioeconomic History   Marital status: Married    Spouse name: Not on file   Number of children: Not on file   Years of education: Not on file   Highest education level: Not on file  Occupational History   Not on file  Tobacco Use   Smoking status: Never   Smokeless tobacco: Never  Vaping Use   Vaping Use: Never used  Substance and Sexual Activity   Alcohol use: No   Drug use: No    Comment: 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   Sexual activity: Not on file  Other Topics Concern   Not on file  Social History Narrative   Not on file   Social Determinants of Health   Financial Resource Strain: Not on file  Food Insecurity: Not on file  Transportation Needs: Not on file  Physical Activity: Not on file  Stress: Not on file  Social Connections: Not on file   Intimate Partner Violence: Not on file    Outpatient Medications Prior to Visit  Medication Sig Dispense Refill   ALPRAZolam (XANAX) 0.5 MG tablet TAKE 1 TABLET BY MOUTH DAILY AS NEEDED FOR SEVERE ANXIETY 30 tablet 3   amLODipine-olmesartan (AZOR) 5-20 MG tablet TAKE 1 TABLET BY MOUTH EVERY DAY 90 tablet 0   lisdexamfetamine (VYVANSE) 50 MG capsule Take 1 capsule (50 mg total) by mouth daily. 30 capsule 0   omega-3 acid ethyl esters (LOVAZA) 1 g capsule TAKE 1 CAPSULE BY MOUTH TWICE A DAY 180 capsule 0   PARoxetine (PAXIL-CR) 37.5 MG 24 hr tablet TAKE 1 TABLET BY MOUTH EVERY DAY 90 tablet 1   pravastatin (PRAVACHOL) 40 MG tablet TAKE 1 TABLET BY MOUTH EVERY DAY 90 tablet 1   traZODone (DESYREL) 50 MG tablet TAKE 1 TABLET BY MOUTH EVERYDAY AT BEDTIME 90 tablet 3   VRAYLAR 3 MG capsule TAKE 1 CAPSULE BY ORAL ROUTE ONCE DAILY 90 capsule 1   No facility-administered medications prior to visit.    Allergies  Allergen Reactions   Avelox [Moxifloxacin Hcl In Nacl] Anaphylaxis   Lactose Intolerance (Gi) Diarrhea   Tape Other (See Comments)    If tape on a while will pull off skin    Review of Systems  Constitutional:  Negative  for chills, fatigue and fever.  HENT:  Positive for congestion, postnasal drip, sinus pressure and sinus pain. Negative for ear pain and sore throat.   Respiratory:  Negative for shortness of breath.   Cardiovascular:  Negative for chest pain and palpitations.  Gastrointestinal:  Negative for abdominal pain, constipation, diarrhea, nausea and vomiting.  Endocrine: Negative for polydipsia, polyphagia and polyuria.  Genitourinary:  Negative for difficulty urinating and dysuria.  Musculoskeletal:  Negative for arthralgias, back pain and myalgias.  Skin:  Negative for rash.  Neurological:  Negative for headaches.  Psychiatric/Behavioral:  Negative for dysphoric mood. The patient is not nervous/anxious.       Objective:    Physical Exam Vitals reviewed.   Constitutional:      Appearance: Normal appearance. She is obese.  HENT:     Head: Normocephalic.     Right Ear: Tympanic membrane, ear canal and external ear normal.     Left Ear: Tympanic membrane, ear canal and external ear normal.     Nose: Congestion present.     Right Turbinates: Not enlarged or swollen.     Left Turbinates: Not enlarged or swollen.     Right Sinus: Maxillary sinus tenderness present.     Left Sinus: Maxillary sinus tenderness present.     Mouth/Throat:     Mouth: Mucous membranes are moist.     Pharynx: No oropharyngeal exudate or posterior oropharyngeal erythema.  Eyes:     Pupils: Pupils are equal, round, and reactive to light.  Neck:     Vascular: No carotid bruit.  Cardiovascular:     Rate and Rhythm: Normal rate and regular rhythm.     Pulses: Normal pulses.     Heart sounds: Normal heart sounds.  Pulmonary:     Effort: Pulmonary effort is normal.     Breath sounds: Normal breath sounds.  Abdominal:     General: Bowel sounds are normal.     Palpations: Abdomen is soft.     Tenderness: There is no abdominal tenderness.  Neurological:     Mental Status: She is alert and oriented to person, place, and time.  Psychiatric:        Mood and Affect: Mood normal.        Behavior: Behavior normal.    BP 120/70    Pulse 97    Temp 99.1 F (37.3 C)    Resp 16    Ht 5' 3"  (1.6 m)    Wt 234 lb (106.1 kg)    SpO2 96%    BMI 41.45 kg/m  Wt Readings from Last 3 Encounters:  03/13/21 234 lb (106.1 kg)  03/07/21 237 lb (107.5 kg)  11/30/20 237 lb (107.5 kg)    Health Maintenance Due  Topic Date Due   Pneumococcal Vaccine 59-59 Years old (1 - PCV) Never done   TETANUS/TDAP  Never done   PAP SMEAR-Modifier  Never done   COLONOSCOPY (Pts 45-77yr Insurance coverage will need to be confirmed)  Never done   Zoster Vaccines- Shingrix (1 of 2) Never done   INFLUENZA VACCINE  10/09/2020   COVID-19 Vaccine (5 - Booster for [employer]) 10/10/2020    There  are no preventive care reminders to display for this patient.   Lab Results  Component Value Date   TSH 1.110 08/15/2020   Lab Results  Component Value Date   WBC 7.8 03/07/2021   HGB 13.8 03/07/2021   HCT 40.2 03/07/2021   MCV 87 03/07/2021  PLT 295 03/07/2021   Lab Results  Component Value Date   NA 138 03/07/2021   K 4.1 03/07/2021   CO2 25 03/07/2021   GLUCOSE 119 (H) 03/07/2021   BUN 11 03/07/2021   CREATININE 1.05 (H) 03/07/2021   BILITOT 0.5 03/07/2021   ALKPHOS 99 03/07/2021   AST 24 03/07/2021   ALT 20 03/07/2021   PROT 7.0 03/07/2021   ALBUMIN 4.7 03/07/2021   CALCIUM 9.4 03/07/2021   EGFR 64 03/07/2021   Lab Results  Component Value Date   CHOL 160 03/07/2021   Lab Results  Component Value Date   HDL 48 03/07/2021   Lab Results  Component Value Date   LDLCALC 95 03/07/2021   Lab Results  Component Value Date   TRIG 88 03/07/2021   Lab Results  Component Value Date   CHOLHDL 3.3 03/07/2021   Lab Results  Component Value Date   HGBA1C 5.3 08/25/2017       Assessment & Plan:   Problem List Items Addressed This Visit   None Visit Diagnoses     Acute non-recurrent sinusitis of other sinus    -  Primary   Relevant Medications   azithromycin (ZITHROMAX) 250 MG tablet   guaiFENesin-codeine (CHERATUSSIN AC) 100-10 MG/5ML syrup   Other Relevant Orders   POC COVID-19 (Completed)- negative   Influenza A/B (Completed)- negative Treat sinusitis with Z-pack and cheratussion for cough      Meds ordered this encounter  Medications   azithromycin (ZITHROMAX) 250 MG tablet    Sig: Take 2 tablets on day 1, then 1 tablet daily on days 2 through 5    Dispense:  6 tablet    Refill:  0   guaiFENesin-codeine (CHERATUSSIN AC) 100-10 MG/5ML syrup    Sig: Take 5 mLs by mouth 3 (three) times daily as needed.    Dispense:  120 mL    Refill:  0    Orders Placed This Encounter  Procedures   POC COVID-19   Influenza A/B     Follow-up: Return if  symptoms worsen or fail to improve.  An After Visit Summary was printed and given to the patient.  Reinaldo Meeker, MD [facility] [phone]

## 2021-03-19 ENCOUNTER — Other Ambulatory Visit: Payer: Self-pay | Admitting: Family Medicine

## 2021-03-29 ENCOUNTER — Encounter: Payer: Self-pay | Admitting: Family Medicine

## 2021-03-29 ENCOUNTER — Other Ambulatory Visit: Payer: Self-pay

## 2021-03-29 ENCOUNTER — Ambulatory Visit: Payer: BC Managed Care – PPO | Admitting: Family Medicine

## 2021-03-29 VITALS — BP 110/72 | HR 84 | Temp 98.2°F | Resp 18 | Ht 63.5 in | Wt 238.0 lb

## 2021-03-29 DIAGNOSIS — R5383 Other fatigue: Secondary | ICD-10-CM | POA: Diagnosis not present

## 2021-03-29 LAB — POC COVID19 BINAXNOW: SARS Coronavirus 2 Ag: NEGATIVE

## 2021-03-29 NOTE — Progress Notes (Signed)
Acute Office Visit  Subjective:    Patient ID: Anna [name], female    DOB: 12-09-1970, 51 y.o.   MRN: [id number]  Chief Complaint  Patient presents with   Fatigue    HPI: Patient is a 51 year old white female with past medical history significant for hypertension, GERD, asthma, ADHD, bipolar disorder who presents today for extreme fatigue which started last 1 week ago.  Anna [name] has had rhinorrhea but no fever, chills or cough.  History of bipolar but denies depression.  Anna [name] has consistently been taking her medication.  Patient is definitely sleeping enough.  Anna [name] is sleeping all night and in fact has been sleeping during the day.  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  Anna [name] returned 2 days ago but was unable to go yesterday.  Anna [name] feels like Anna [name] is unable to function.  Denies alcohol use.  Patient has a history several years ago of alcohol abuse.  This was during what appeared to be a manic episode at the time.  Anna [name] has been well controlled on her medications.  In addition when I first started taking care of [place] Anna [name] had an issue with her bowels during which Anna [name] had a lot of diarrhea and cramping and final diagnosis was IBS D.  This has been well controlled for years.  At that time I believe Anna [name] had a GI work-up which included colonoscopy and EGD. Recent mammogram Past Medical History:  Diagnosis Date   ADD (attention deficit disorder)    Arthritis    Asthma    seasonal   Bipolar affective disorder, current episode depressed ([place])    GERD (gastroesophageal reflux disease)    occ   History of recurrent UTIs 03/11/2012   Hypertension     Past Surgical History:  Procedure Laterality Date   ABDOMINAL HYSTERECTOMY  2010   endometriosis. Total hysterectomy   ANTERIOR CERVICAL DECOMP/DISCECTOMY FUSION N/A 07/13/2012   Procedure: ANTERIOR CERVICAL DECOMPRESSION/DISCECTOMY FUSION 1 LEVEL;  Surgeon: Elaina Hoops, MD;  Location: [facility];   Service: Neurosurgery;  Laterality: N/A;  Anterior Cervical Decompression/Discectomy Cervical Three-Four   CARPAL TUNNEL RELEASE Right    CERVICAL DISC SURGERY  09   CHOLECYSTECTOMY      Family History  Problem Relation Age of Onset   Breast cancer Neg Hx     Social History   Socioeconomic History   Marital status: Married    Spouse name: Not on file   Number of children: Not on file   Years of education: Not on file   Highest education level: Not on file  Occupational History   Not on file  Tobacco Use   Smoking status: Never   Smokeless tobacco: Never  Vaping Use   Vaping Use: Never used  Substance and Sexual Activity   Alcohol use: No   Drug use: No    Comment: 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   Sexual activity: Not on file  Other Topics Concern   Not on file  Social History Narrative   Not on file   Social Determinants of Health   Financial Resource Strain: Not on file  Food Insecurity: Not on file  Transportation Needs: Not on file  Physical Activity: Not on file  Stress: Not on file  Social Connections: Not on file  Intimate Partner Violence: Not on file    Outpatient Medications Prior to Visit  Medication Sig Dispense Refill   ALPRAZolam (XANAX) 0.5  MG tablet TAKE 1 TABLET BY MOUTH DAILY AS NEEDED FOR SEVERE ANXIETY 30 tablet 3   amLODipine-olmesartan (AZOR) 5-20 MG tablet TAKE 1 TABLET BY MOUTH EVERY DAY 90 tablet 0   lisdexamfetamine (VYVANSE) 50 MG capsule Take 1 capsule (50 mg total) by mouth daily. 30 capsule 0   omega-3 acid ethyl esters (LOVAZA) 1 g capsule TAKE 1 CAPSULE BY MOUTH TWICE A DAY 180 capsule 0   PARoxetine (PAXIL-CR) 37.5 MG 24 hr tablet TAKE 1 TABLET BY MOUTH EVERY DAY 90 tablet 1   pravastatin (PRAVACHOL) 40 MG tablet TAKE 1 TABLET BY MOUTH EVERY DAY 90 tablet 1   traZODone (DESYREL) 50 MG tablet TAKE 1 TABLET BY MOUTH EVERYDAY AT BEDTIME 90 tablet 3   VRAYLAR 3 MG capsule TAKE 1 CAPSULE BY ORAL ROUTE ONCE DAILY 90 capsule 1    guaiFENesin-codeine (CHERATUSSIN AC) 100-10 MG/5ML syrup Take 5 mLs by mouth 3 (three) times daily as needed. 120 mL 0   No facility-administered medications prior to visit.    Allergies  Allergen Reactions   Avelox [Moxifloxacin Hcl In Nacl] Anaphylaxis   Lactose Intolerance (Gi) Diarrhea   Tape Other (See Comments)    If tape on a while will pull off skin    Review of Systems  Constitutional:  Positive for fatigue. Negative for chills and fever.  HENT:  Positive for rhinorrhea. Negative for congestion and sore throat.   Respiratory:  Negative for cough and shortness of breath.   Cardiovascular:  Negative for chest pain.  Gastrointestinal:  Negative for abdominal pain, constipation, diarrhea, nausea and vomiting.  Genitourinary:  Negative for dysuria and urgency.  Musculoskeletal:  Negative for back pain and myalgias.  Neurological:  Positive for weakness. Negative for dizziness, light-headedness and headaches.  Psychiatric/Behavioral:  Negative for dysphoric mood. The patient is not nervous/anxious.       Objective:    Physical Exam Vitals reviewed.  Constitutional:      General: Anna [name] is not in acute distress.    Appearance: Normal appearance. Anna [name] is normal weight.  HENT:     Right Ear: Tympanic membrane and ear canal normal.     Left Ear: Tympanic membrane and ear canal normal.     Nose: Nose normal. No congestion or rhinorrhea.  Eyes:     Conjunctiva/sclera: Conjunctivae normal.  Neck:     Thyroid: No thyroid mass.     Vascular: No carotid bruit.  Cardiovascular:     Rate and Rhythm: Normal rate and regular rhythm.     Pulses: Normal pulses.     Heart sounds: Normal heart sounds. No murmur heard. Pulmonary:     Effort: Pulmonary effort is normal.     Breath sounds: Normal breath sounds.  Abdominal:     General: Bowel sounds are normal.     Palpations: Abdomen is soft. There is no mass.     Tenderness: There is no abdominal tenderness.  Musculoskeletal:         General: Normal range of motion.  Lymphadenopathy:     Cervical: No cervical adenopathy.  Skin:    General: Skin is warm and dry.  Neurological:     Mental Status: Anna [name] is alert and oriented to person, place, and time.     Cranial Nerves: No cranial nerve deficit.     Sensory: No sensory deficit.     Motor: No weakness (5/5 UE, 5/5 LE).     Coordination: Coordination normal.  Psychiatric:  Behavior: Behavior normal.     Comments: Pt seems a little anxious. Not depressed.     BP 110/72    Pulse 84    Temp 98.2 F (36.8 C)    Resp 18    Ht 5' 3.5" (1.613 m)    Wt 238 lb (108 kg)    BMI 41.50 kg/m  Wt Readings from Last 3 Encounters:  03/29/21 238 lb (108 kg)  03/13/21 234 lb (106.1 kg)  03/07/21 237 lb (107.5 kg)    Health Maintenance Due  Topic Date Due   TETANUS/TDAP  Never done   PAP SMEAR-Modifier  Never done   COLONOSCOPY (Pts 45-59yr Insurance coverage will need to be confirmed)  Never done   Zoster Vaccines- Shingrix (1 of 2) Never done   INFLUENZA VACCINE  10/09/2020   COVID-19 Vaccine (5 - Booster for [employer]) 10/10/2020    There are no preventive care reminders to display for this patient.   Lab Results  Component Value Date   TSH 1.000 03/29/2021   Lab Results  Component Value Date   WBC 9.7 03/29/2021   HGB 13.7 03/29/2021   HCT 40.4 03/29/2021   MCV 88 03/29/2021   PLT 294 03/29/2021   Lab Results  Component Value Date   NA 140 03/29/2021   K 4.2 03/29/2021   CO2 25 03/29/2021   GLUCOSE 86 03/29/2021   BUN 10 03/29/2021   CREATININE 1.03 (H) 03/29/2021   BILITOT 0.4 03/29/2021   ALKPHOS 89 03/29/2021   AST 20 03/29/2021   ALT 29 03/29/2021   PROT 6.7 03/29/2021   ALBUMIN 4.4 03/29/2021   CALCIUM 9.2 03/29/2021   EGFR 65 03/29/2021   Lab Results  Component Value Date   CHOL 160 03/07/2021   Lab Results  Component Value Date   HDL 48 03/07/2021   Lab Results  Component Value Date   LDLCALC 95 03/07/2021   Lab Results   Component Value Date   TRIG 88 03/07/2021   Lab Results  Component Value Date   CHOLHDL 3.3 03/07/2021   Lab Results  Component Value Date   HGBA1C 5.3 08/25/2017       Assessment & Plan:   Problem List Items Addressed This Visit       Other   Other fatigue - Primary    Order lab work, EKG (NSR) and rapid covid test (neg.) Consider Depression.  Note for missed work given. Patient will monitor symptoms. Anna [name] is to rest, eat 3 meals per day.  Plan to return to work tomorrow.       Relevant Orders   CBC with Differential/Platelet (Completed)   Comprehensive metabolic panel (Completed)   TSH (Completed)   POC COVID-19 BinaxNow (Completed)   EKG 12-Lead (Completed)   No orders of the defined types were placed in this encounter.   Orders Placed This Encounter  Procedures   CBC with Differential/Platelet   Comprehensive metabolic panel   TSH   POC COVID-19 BinaxNow   EKG 12-Lead    Marla Leal Borjas scribed for KRochel Brome MD.,helping with documentation on the behalf of KRochel Brome MD,as directed by  KRochel Brome MD while in the presence of KRochel Brome MD.   Follow-up: Await labs/testing for assessment and recommendations.   An After Visit Summary was printed and given to the patient.  KRochel Brome MD [facility] [phone]

## 2021-03-30 ENCOUNTER — Other Ambulatory Visit: Payer: Self-pay

## 2021-03-30 LAB — CBC WITH DIFFERENTIAL/PLATELET
Basophils Absolute: 0.1 10*3/uL (ref 0.0–0.2)
Basos: 1 %
EOS (ABSOLUTE): 0.2 10*3/uL (ref 0.0–0.4)
Eos: 2 %
Hematocrit: 40.4 % (ref 34.0–46.6)
Hemoglobin: 13.7 g/dL (ref 11.1–15.9)
Immature Grans (Abs): 0.1 10*3/uL (ref 0.0–0.1)
Immature Granulocytes: 1 %
Lymphocytes Absolute: 3.4 10*3/uL — ABNORMAL HIGH (ref 0.7–3.1)
Lymphs: 35 %
MCH: 29.8 pg (ref 26.6–33.0)
MCHC: 33.9 g/dL (ref 31.5–35.7)
MCV: 88 fL (ref 79–97)
Monocytes Absolute: 0.8 10*3/uL (ref 0.1–0.9)
Monocytes: 8 %
Neutrophils Absolute: 5.1 10*3/uL (ref 1.4–7.0)
Neutrophils: 53 %
Platelets: 294 10*3/uL (ref 150–450)
RBC: 4.6 x10E6/uL (ref 3.77–5.28)
RDW: 12.9 % (ref 11.7–15.4)
WBC: 9.7 10*3/uL (ref 3.4–10.8)

## 2021-03-30 LAB — COMPREHENSIVE METABOLIC PANEL
ALT: 29 IU/L (ref 0–32)
AST: 20 IU/L (ref 0–40)
Albumin/Globulin Ratio: 1.9 (ref 1.2–2.2)
Albumin: 4.4 g/dL (ref 3.8–4.9)
Alkaline Phosphatase: 89 IU/L (ref 44–121)
BUN/Creatinine Ratio: 10 (ref 9–23)
BUN: 10 mg/dL (ref 6–24)
Bilirubin Total: 0.4 mg/dL (ref 0.0–1.2)
CO2: 25 mmol/L (ref 20–29)
Calcium: 9.2 mg/dL (ref 8.7–10.2)
Chloride: 103 mmol/L (ref 96–106)
Creatinine, Ser: 1.03 mg/dL — ABNORMAL HIGH (ref 0.57–1.00)
Globulin, Total: 2.3 g/dL (ref 1.5–4.5)
Glucose: 86 mg/dL (ref 70–99)
Potassium: 4.2 mmol/L (ref 3.5–5.2)
Sodium: 140 mmol/L (ref 134–144)
Total Protein: 6.7 g/dL (ref 6.0–8.5)
eGFR: 65 mL/min/{1.73_m2} (ref >59)

## 2021-03-30 LAB — TSH: TSH: 1 u[IU]/mL (ref 0.450–4.500)

## 2021-03-30 NOTE — Progress Notes (Addendum)
Blood count normal.  Liver function normal.  Kidney function normal.  Thyroid function normal.  

## 2021-03-31 ENCOUNTER — Encounter: Payer: Self-pay | Admitting: Family Medicine

## 2021-03-31 DIAGNOSIS — R5383 Other fatigue: Secondary | ICD-10-CM | POA: Insufficient documentation

## 2021-03-31 NOTE — Assessment & Plan Note (Addendum)
Order lab work, EKG (NSR) and rapid covid test (neg.) Consider Depression.  Note for missed work given. Patient will monitor symptoms. She is to rest, eat 3 meals per day.  Plan to return to work tomorrow.

## 2021-04-01 MED ORDER — LISDEXAMFETAMINE DIMESYLATE 50 MG PO CAPS: 50.0000 mg | ORAL_CAPSULE | Freq: Every day | ORAL | 0 refills | Status: DC

## 2021-04-03 ENCOUNTER — Telehealth: Payer: Self-pay

## 2021-04-03 MED ORDER — VRAYLAR 6 MG PO CAPS: 1.0000 | ORAL_CAPSULE | Freq: Every day | ORAL | 1 refills | Status: DC

## 2021-04-03 NOTE — Telephone Encounter (Signed)
Recommend increase vraylar to 6 mg once daily.  Send new rx.

## 2021-04-03 NOTE — Telephone Encounter (Signed)
Patient noticed this weekend her depression is worsening. Was seen 1/19 for fatigue. At that time was sleeping all night and during day. At this time she is only sleeping about 4-5 hours at night and no longer sleeping during day. She feels she is unable to enjoy things. Denies thoughts of self harm.   Lorita Officer, CCMA 04/03/21 9:30 AM

## 2021-04-03 NOTE — Telephone Encounter (Signed)
She Vu. New rx sent.   Lorita Officer, [place] 04/03/21 1:39 PM

## 2021-04-15 ENCOUNTER — Other Ambulatory Visit: Payer: Self-pay | Admitting: Family Medicine

## 2021-04-18 ENCOUNTER — Encounter: Payer: Self-pay | Admitting: Nurse Practitioner

## 2021-04-18 ENCOUNTER — Ambulatory Visit (INDEPENDENT_AMBULATORY_CARE_PROVIDER_SITE_OTHER): Payer: BC Managed Care – PPO | Admitting: Nurse Practitioner

## 2021-04-18 VITALS — BP 128/84 | HR 87 | Temp 97.4°F | Ht 63.0 in | Wt 238.0 lb

## 2021-04-18 DIAGNOSIS — J328 Other chronic sinusitis: Secondary | ICD-10-CM

## 2021-04-18 DIAGNOSIS — J4521 Mild intermittent asthma with (acute) exacerbation: Secondary | ICD-10-CM

## 2021-04-18 MED ORDER — FLUTICASONE PROPIONATE 50 MCG/ACT NA SUSP: 2.0000 | Freq: Every day | NASAL | 6 refills | Status: DC

## 2021-04-18 MED ORDER — PROMETHAZINE-DM 6.25-15 MG/5ML PO SYRP: 5.0000 mL | ORAL_SOLUTION | Freq: Four times a day (QID) | ORAL | 0 refills | Status: DC | PRN

## 2021-04-18 MED ORDER — ALBUTEROL SULFATE HFA 108 (90 BASE) MCG/ACT IN AERS: 2.0000 | INHALATION_SPRAY | Freq: Four times a day (QID) | RESPIRATORY_TRACT | 0 refills | Status: DC | PRN

## 2021-04-18 MED ORDER — TRELEGY ELLIPTA 100-62.5-25 MCG/ACT IN AEPB: 1.0000 | INHALATION_SPRAY | Freq: Every day | RESPIRATORY_TRACT | 11 refills | Status: DC

## 2021-04-18 MED ORDER — AMOXICILLIN-POT CLAVULANATE 875-125 MG PO TABS: 1.0000 | ORAL_TABLET | Freq: Two times a day (BID) | ORAL | 0 refills | Status: DC

## 2021-04-18 NOTE — Patient Instructions (Addendum)
Take Augmentin twice daily for 10 days Use Flonase nasal spray daily Take Promethazine-Dextromethorphan as needed for cough Use Trelegy inhaler daily, rinse mouth afterwards Use Albuterol inhaler as needed for shortness of breath Follow-up as needed   Sinusitis, Adult Sinusitis is soreness and swelling (inflammation) of your sinuses. Sinuses are hollow spaces in the bones around your face. They are located: Around your eyes. In the middle of your forehead. Behind your nose. In your cheekbones. Your sinuses and nasal passages are lined with a fluid called mucus. Mucus drains out of your sinuses. Swelling can trap mucus in your sinuses. This lets germs (bacteria, virus, or fungus) grow, which leads to infection. Most of the time, this condition is caused by a virus. What are the causes? This condition is caused by: Allergies. Asthma. Germs. Things that block your nose or sinuses. Growths in the nose (nasal polyps). Chemicals or irritants in the air. Fungus (rare). What increases the risk? You are more likely to develop this condition if: You have a weak body defense system (immune system). You do a lot of swimming or diving. You use nasal sprays too much. You smoke. What are the signs or symptoms? The main symptoms of this condition are pain and a feeling of pressure around the sinuses. Other symptoms include: Stuffy nose (congestion). Runny nose (drainage). Swelling and warmth in the sinuses. Headache. Toothache. A cough that may get worse at night. Mucus that collects in the throat or the back of the nose (postnasal drip). Being unable to smell and taste. Being very tired (fatigue). A fever. Sore throat. Bad breath. How is this diagnosed? This condition is diagnosed based on: Your symptoms. Your medical history. A physical exam. Tests to find out if your condition is short-term (acute) or long-term (chronic). Your doctor may: Check your nose for growths  (polyps). Check your sinuses using a tool that has a light (endoscope). Check for allergies or germs. Do imaging tests, such as an MRI or CT scan. How is this treated? Treatment for this condition depends on the cause and whether it is short-term or long-term. If caused by a virus, your symptoms should go away on their own within 10 days. You may be given medicines to relieve symptoms. They include: Medicines that shrink swollen tissue in the nose. Medicines that treat allergies (antihistamines). A spray that treats swelling of the nostrils.  Rinses that help get rid of thick mucus in your nose (nasal saline washes). If caused by bacteria, your doctor may wait to see if you will get better without treatment. You may be given antibiotic medicine if you have: A very bad infection. A weak body defense system. If caused by growths in the nose, you may need to have surgery. Follow these instructions at home: Medicines Take, use, or apply over-the-counter and prescription medicines only as told by your doctor. These may include nasal sprays. If you were prescribed an antibiotic medicine, take it as told by your doctor. Do not stop taking the antibiotic even if you start to feel better. Hydrate and humidify  Drink enough water to keep your pee (urine) pale yellow. Use a cool mist humidifier to keep the humidity level in your home above 50%. Breathe in steam for 10-15 minutes, 3-4 times a day, or as told by your doctor. You can do this in the bathroom while a hot shower is running. Try not to spend time in cool or dry air. Rest Rest as much as you can. Sleep with your  head raised (elevated). Make sure you get enough sleep each night. General instructions  Put a warm, moist washcloth on your face 3-4 times a day, or as often as told by your doctor. This will help with discomfort. Wash your hands often with soap and water. If there is no soap and water, use hand sanitizer. Do not smoke. Avoid  being around people who are smoking (secondhand smoke). Keep all follow-up visits as told by your doctor. This is important. Contact a doctor if: You have a fever. Your symptoms get worse. Your symptoms do not get better within 10 days. Get help right away if: You have a very bad headache. You cannot stop throwing up (vomiting). You have very bad pain or swelling around your face or eyes. You have trouble seeing. You feel confused. Your neck is stiff. You have trouble breathing. Summary Sinusitis is swelling of your sinuses. Sinuses are hollow spaces in the bones around your face. This condition is caused by tissues in your nose that become inflamed or swollen. This traps germs. These can lead to infection. If you were prescribed an antibiotic medicine, take it as told by your doctor. Do not stop taking it even if you start to feel better. Keep all follow-up visits as told by your doctor. This is important. This information is not intended to replace advice given to you by your health care provider. Make sure you discuss any questions you have with your health care provider. Document Revised: 07/28/2017 Document Reviewed: 07/28/2017 Elsevier Patient Education  2022 [employer].

## 2021-04-18 NOTE — Progress Notes (Signed)
Acute Office Visit  Subjective:    Patient ID: Anna [name], female    DOB: [birth year], 51 y.o.   MRN: [id number]  Chief Complaint  Patient presents with   URI    HPI: Patient is in today for Upper respiratory symptoms She complains of sinus congestion/pain/pressure, post-nasal-drip, sore throat, wheezing, and cough.  Onset of symptoms was a few weeks ago and staying constant.She was treated with a course of Azithromycin and Cherratussin cough syrup on 03/13/21 with minimal improvement.  Past history is significant for asthma . Patient is non-smoker.Home covid test today was negative.  Claritin and guafenesin otc.  Past Medical History:  Diagnosis Date   ADD (attention deficit disorder)    Arthritis    Asthma    seasonal   Bipolar affective disorder, current episode depressed ([place])    GERD (gastroesophageal reflux disease)    occ   History of recurrent UTIs 03/11/2012   Hypertension     Past Surgical History:  Procedure Laterality Date   ABDOMINAL HYSTERECTOMY  2010   endometriosis. Total hysterectomy   ANTERIOR CERVICAL DECOMP/DISCECTOMY FUSION N/A 07/13/2012   Procedure: ANTERIOR CERVICAL DECOMPRESSION/DISCECTOMY FUSION 1 LEVEL;  Surgeon: Elaina Hoops, MD;  Location: [facility];  Service: Neurosurgery;  Laterality: N/A;  Anterior Cervical Decompression/Discectomy Cervical Three-Four   CARPAL TUNNEL RELEASE Right    CERVICAL DISC SURGERY  09   CHOLECYSTECTOMY      Family History  Problem Relation Age of Onset   Breast cancer Neg Hx     Social History   Socioeconomic History   Marital status: Married    Spouse name: Not on file   Number of children: Not on file   Years of education: Not on file   Highest education level: Not on file  Occupational History   Not on file  Tobacco Use   Smoking status: Never   Smokeless tobacco: Never  Vaping Use   Vaping Use: Never used  Substance and Sexual Activity   Alcohol use: No   Drug use: No    Comment: 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   Sexual activity: Not on file  Other Topics Concern   Not on file  Social History Narrative   Not on file   Social Determinants of Health   Financial Resource Strain: Not on file  Food Insecurity: Not on file  Transportation Needs: Not on file  Physical Activity: Not on file  Stress: Not on file  Social Connections: Not on file  Intimate Partner Violence: Not on file    Outpatient Medications Prior to Visit  Medication Sig Dispense Refill   ALPRAZolam (XANAX) 0.5 MG tablet TAKE 1 TABLET BY MOUTH DAILY AS NEEDED FOR SEVERE ANXIETY 30 tablet 3   amLODipine-olmesartan (AZOR) 5-20 MG tablet TAKE 1 TABLET BY MOUTH EVERY DAY 90 tablet 0   Cariprazine HCl (VRAYLAR) 6 MG CAPS Take 1 capsule (6 mg total) by mouth daily. 90 capsule 1   lisdexamfetamine (VYVANSE) 50 MG capsule Take 1 capsule (50 mg total) by mouth daily. 30 capsule 0   omega-3 acid ethyl esters (LOVAZA) 1 g capsule TAKE 1 CAPSULE BY MOUTH TWICE A DAY 180 capsule 0   PARoxetine (PAXIL-CR) 37.5 MG 24 hr tablet TAKE 1 TABLET BY MOUTH EVERY DAY 90 tablet 1   pravastatin (PRAVACHOL) 40 MG tablet TAKE 1 TABLET BY MOUTH EVERY DAY 90 tablet 1   traZODone (DESYREL) 50 MG tablet TAKE 1 TABLET BY MOUTH EVERYDAY AT BEDTIME  90 tablet 3   No facility-administered medications prior to visit.    Allergies  Allergen Reactions   Avelox [Moxifloxacin Hcl In Nacl] Anaphylaxis   Lactose Intolerance (Gi) Diarrhea   Tape Other (See Comments)    If tape on a while will pull off skin    Review of Systems  Constitutional:  Positive for fatigue. Negative for chills and fever.  HENT:  Positive for ear pain (bilateral), postnasal drip, rhinorrhea, sinus pressure, sinus pain and sore throat. Negative for congestion.   Respiratory:  Positive for cough (sore from coughing), chest tightness and wheezing. Negative for shortness of breath.   Cardiovascular:  Negative for chest pain.  Gastrointestinal:  Negative for  diarrhea and nausea.  Neurological:  Negative for dizziness and headaches.      Objective:    Physical Exam Vitals reviewed.  Constitutional:      Appearance: She is ill-appearing.  HENT:     Head: Normocephalic.     Right Ear: Tenderness present.     Left Ear: Tenderness present.     Nose: Congestion and rhinorrhea present.     Right Turbinates: Swollen.     Left Turbinates: Swollen.     Right Sinus: Maxillary sinus tenderness present. No frontal sinus tenderness.     Left Sinus: Maxillary sinus tenderness present. No frontal sinus tenderness.     Mouth/Throat:     Mouth: Mucous membranes are dry.     Pharynx: Posterior oropharyngeal erythema present.  Eyes:     Pupils: Pupils are equal, round, and reactive to light.  Cardiovascular:     Rate and Rhythm: Normal rate and regular rhythm.     Pulses: Normal pulses.     Heart sounds: Normal heart sounds.  Pulmonary:     Effort: Pulmonary effort is normal.     Breath sounds: Normal breath sounds.  Abdominal:     General: Bowel sounds are normal.     Palpations: Abdomen is soft.  Musculoskeletal:        General: Normal range of motion.  Skin:    General: Skin is warm and dry.     Capillary Refill: Capillary refill takes less than 2 seconds.  Neurological:     General: No focal deficit present.     Mental Status: She is alert and oriented to person, place, and time.  Psychiatric:        Mood and Affect: Mood normal.        Behavior: Behavior normal.    BP 128/84    Pulse 87    Temp (!) 97.4 F (36.3 C)    Ht _0  (1.6 m)    Wt 238 lb (108 kg)    SpO2 99%    BMI 42.16 kg/m   Wt Readings from Last 3 Encounters:  03/29/21 238 lb (108 kg)  03/13/21 234 lb (106.1 kg)  03/07/21 237 lb (107.5 kg)    Health Maintenance Due  Topic Date Due   TETANUS/TDAP  Never done   PAP SMEAR-Modifier  Never done   COLONOSCOPY (Pts 45-54yr Insurance coverage will need to be confirmed)  Never done   Zoster Vaccines- Shingrix (1 of 2)  Never done   INFLUENZA VACCINE  10/09/2020   COVID-19 Vaccine (5 - Booster for Pfizer series) 10/10/2020       Lab Results  Component Value Date   TSH 1.000 03/29/2021   Lab Results  Component Value Date   WBC 9.7 03/29/2021   HGB 13.7 03/29/2021  HCT 40.4 03/29/2021   MCV 88 03/29/2021   PLT 294 03/29/2021   Lab Results  Component Value Date   NA 140 03/29/2021   K 4.2 03/29/2021   CO2 25 03/29/2021   GLUCOSE 86 03/29/2021   BUN 10 03/29/2021   CREATININE 1.03 (H) 03/29/2021   BILITOT 0.4 03/29/2021   ALKPHOS 89 03/29/2021   AST 20 03/29/2021   ALT 29 03/29/2021   PROT 6.7 03/29/2021   ALBUMIN 4.4 03/29/2021   CALCIUM 9.2 03/29/2021   EGFR 65 03/29/2021   Lab Results  Component Value Date   CHOL 160 03/07/2021   Lab Results  Component Value Date   HDL 48 03/07/2021   Lab Results  Component Value Date   LDLCALC 95 03/07/2021   Lab Results  Component Value Date   TRIG 88 03/07/2021   Lab Results  Component Value Date   CHOLHDL 3.3 03/07/2021   Lab Results  Component Value Date   HGBA1C 5.3 08/25/2017       Assessment & Plan:   1. Other chronic sinusitis - amoxicillin-clavulanate (AUGMENTIN) 875-125 MG tablet; Take 1 tablet by mouth 2 (two) times daily.  Dispense: 20 tablet; Refill: 0 - fluticasone (FLONASE) 50 MCG/ACT nasal spray; Place 2 sprays into both nostrils daily.  Dispense: 16 g; Refill: 6 - promethazine-dextromethorphan (PROMETHAZINE-DM) 6.25-15 MG/5ML syrup; Take 5 mLs by mouth 4 (four) times daily as needed.  Dispense: 118 mL; Refill: 0  2. Mild intermittent asthma with acute exacerbation - Fluticasone-Umeclidin-Vilant (TRELEGY ELLIPTA) 100-62.5-25 MCG/ACT AEPB; Inhale 1 puff into the lungs daily.  Dispense: 1 each; Refill: 11 - promethazine-dextromethorphan (PROMETHAZINE-DM) 6.25-15 MG/5ML syrup; Take 5 mLs by mouth 4 (four) times daily as needed.  Dispense: 118 mL; Refill: 0 - albuterol (VENTOLIN HFA) 108 (90 Base) MCG/ACT inhaler;  Inhale 2 puffs into the lungs every 6 (six) hours as needed for wheezing or shortness of breath.  Dispense: 8 g; Refill: 0       Take Augmentin twice daily for 10 days Use Flonase nasal spray daily Take Promethazine-Dextromethorphan as needed for cough Use Trelegy inhaler daily, rinse mouth afterwards Use Albuterol inhaler as needed for shortness of breath Follow-up as needed  Follow-up: PRN  An After Visit Summary was printed and given to the patient.  I, Rip Harbour, NP, have reviewed all documentation for this visit. The documentation on 04/18/21 for the exam, diagnosis, procedures, and orders are all accurate and complete.    Signed, Rip Harbour, NP [place] [phone]

## 2021-04-20 ENCOUNTER — Telehealth: Payer: Self-pay

## 2021-04-20 NOTE — Telephone Encounter (Signed)
Karra called to report that she has seen very little improvement since her visit on Wednesday.  She picked up her medication Wednesday evening and started the medication as prescribed.  She continues to have sinus symptoms and cough.  She reports no fever, chills, or increased cough or shortness of breath.  She was encouraged to give the medication a chance to work.  She was encouraged to increase her fluids, well balanced diet and plenty of rest.  Instructed to call us back on Monday if symptoms have not improved.

## 2021-04-21 ENCOUNTER — Other Ambulatory Visit: Payer: Self-pay | Admitting: Family Medicine

## 2021-04-23 ENCOUNTER — Ambulatory Visit: Payer: BC Managed Care – PPO | Admitting: Family Medicine

## 2021-04-23 VITALS — BP 122/70 | HR 95 | Temp 97.8°F | Resp 18 | Ht 63.0 in | Wt 241.2 lb

## 2021-04-23 DIAGNOSIS — R051 Acute cough: Secondary | ICD-10-CM | POA: Insufficient documentation

## 2021-04-23 DIAGNOSIS — J0181 Other acute recurrent sinusitis: Secondary | ICD-10-CM | POA: Diagnosis not present

## 2021-04-23 DIAGNOSIS — B372 Candidiasis of skin and nail: Secondary | ICD-10-CM | POA: Insufficient documentation

## 2021-04-23 DIAGNOSIS — J208 Acute bronchitis due to other specified organisms: Secondary | ICD-10-CM | POA: Insufficient documentation

## 2021-04-23 DIAGNOSIS — R052 Subacute cough: Secondary | ICD-10-CM | POA: Insufficient documentation

## 2021-04-23 MED ORDER — PREDNISONE 10 MG PO TABS: ORAL_TABLET | ORAL | 0 refills | Status: AC

## 2021-04-23 MED ORDER — FLUCONAZOLE 100 MG PO TABS: 100.0000 mg | ORAL_TABLET | Freq: Every day | ORAL | 0 refills | Status: DC

## 2021-04-23 MED ORDER — TRIAMCINOLONE ACETONIDE 40 MG/ML IJ SUSP
80.0000 mg | Freq: Once | INTRAMUSCULAR | Status: AC
  Administered 2021-04-23: 80 mg via INTRAMUSCULAR

## 2021-04-23 MED ORDER — CEFTRIAXONE SODIUM 1 G IJ SOLR
1.0000 g | Freq: Once | INTRAMUSCULAR | Status: AC
  Administered 2021-04-23: 1 g via INTRAMUSCULAR

## 2021-04-23 NOTE — Patient Instructions (Signed)
Continue augmentin. Start predpack (prednisone) Given shot of rocephin and kenalog Ordered chest xray.  Continue other medicines.

## 2021-04-23 NOTE — Progress Notes (Signed)
Acute Office Visit  Subjective:    Patient ID: Anna [name], female    DOB: 03-30-1970, 51 y.o.   MRN: [id number]  Chief Complaint  Patient presents with   Cough    HPI: Patient is in today for continued cough and dyspnea. Patient was given augmentin, flonase, promethazine DIABETES, trelegy and albuterol every 6 hours as needed. Denies chills, shortness of breath, chest pain.   Past Medical History:  Diagnosis Date   ADD (attention deficit disorder)    Arthritis    Asthma    seasonal   Bipolar affective disorder, current episode depressed ([place])    GERD (gastroesophageal reflux disease)    occ   History of recurrent UTIs 03/11/2012   Hypertension     Past Surgical History:  Procedure Laterality Date   ABDOMINAL HYSTERECTOMY  2010   endometriosis. Total hysterectomy   ANTERIOR CERVICAL DECOMP/DISCECTOMY FUSION N/A 07/13/2012   Procedure: ANTERIOR CERVICAL DECOMPRESSION/DISCECTOMY FUSION 1 LEVEL;  Surgeon: Elaina Hoops, MD;  Location: [facility];  Service: Neurosurgery;  Laterality: N/A;  Anterior Cervical Decompression/Discectomy Cervical Three-Four   CARPAL TUNNEL RELEASE Right    CERVICAL DISC SURGERY  09   CHOLECYSTECTOMY      Family History  Problem Relation Age of Onset   Breast cancer Neg Hx     Social History   Socioeconomic History   Marital status: Married    Spouse name: Not on file   Number of children: Not on file   Years of education: Not on file   Highest education level: Not on file  Occupational History   Not on file  Tobacco Use   Smoking status: Never   Smokeless tobacco: Never  Vaping Use   Vaping Use: Never used  Substance and Sexual Activity   Alcohol use: No   Drug use: No    Comment: 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   Sexual activity: Not on file  Other Topics Concern   Not on file  Social History Narrative   Not on file   Social Determinants of Health   Financial Resource Strain: Not on file  Food Insecurity: Not on file   Transportation Needs: Not on file  Physical Activity: Not on file  Stress: Not on file  Social Connections: Not on file  Intimate Partner Violence: Not on file    Outpatient Medications Prior to Visit  Medication Sig Dispense Refill   albuterol (VENTOLIN HFA) 108 (90 Base) MCG/ACT inhaler Inhale 2 puffs into the lungs every 6 (six) hours as needed for wheezing or shortness of breath. 8 g 0   ALPRAZolam (XANAX) 0.5 MG tablet TAKE 1 TABLET BY MOUTH DAILY AS NEEDED FOR SEVERE ANXIETY 30 tablet 3   amLODipine-olmesartan (AZOR) 5-20 MG tablet TAKE 1 TABLET BY MOUTH EVERY DAY 90 tablet 0   amoxicillin-clavulanate (AUGMENTIN) 875-125 MG tablet Take 1 tablet by mouth 2 (two) times daily. 20 tablet 0   Cariprazine HCl (VRAYLAR) 6 MG CAPS Take 1 capsule (6 mg total) by mouth daily. 90 capsule 1   fluticasone (FLONASE) 50 MCG/ACT nasal spray Place 2 sprays into both nostrils daily. 16 g 6   Fluticasone-Umeclidin-Vilant (TRELEGY ELLIPTA) 100-62.5-25 MCG/ACT AEPB Inhale 1 puff into the lungs daily. 1 each 11   omega-3 acid ethyl esters (LOVAZA) 1 g capsule TAKE 1 CAPSULE BY MOUTH TWICE A DAY 180 capsule 0   PARoxetine (PAXIL-CR) 37.5 MG 24 hr tablet TAKE 1 TABLET BY MOUTH EVERY DAY 90 tablet 1  pravastatin (PRAVACHOL) 40 MG tablet TAKE 1 TABLET BY MOUTH EVERY DAY 90 tablet 1   promethazine-dextromethorphan (PROMETHAZINE-DM) 6.25-15 MG/5ML syrup Take 5 mLs by mouth 4 (four) times daily as needed. 118 mL 0   traZODone (DESYREL) 50 MG tablet TAKE 1 TABLET BY MOUTH EVERYDAY AT BEDTIME 90 tablet 3   lisdexamfetamine (VYVANSE) 50 MG capsule Take 1 capsule (50 mg total) by mouth daily. 30 capsule 0   No facility-administered medications prior to visit.    Allergies  Allergen Reactions   Avelox [Moxifloxacin Hcl In Nacl] Anaphylaxis   Lactose Intolerance (Gi) Diarrhea   Tape Other (See Comments)    If tape on a while will pull off skin    Review of Systems  Constitutional:  Positive for fatigue and  fever. Negative for chills.  HENT:  Negative for congestion, rhinorrhea and sore throat.   Respiratory:  Positive for cough and shortness of breath.   Cardiovascular:  Negative for chest pain and palpitations.  Gastrointestinal:  Negative for abdominal pain, constipation, diarrhea, nausea and vomiting.  Genitourinary:  Negative for dysuria and urgency.  Musculoskeletal:  Negative for back pain and myalgias.  Skin:  Positive for rash (rash under breast area).  Neurological:  Positive for headaches. Negative for dizziness, weakness and light-headedness.  Psychiatric/Behavioral:  Negative for dysphoric mood. The patient is not nervous/anxious.       Objective:    Physical Exam Vitals reviewed.  Constitutional:      Appearance: Normal appearance. She is obese.  HENT:     Right Ear: Tympanic membrane, ear canal and external ear normal.     Left Ear: Tympanic membrane, ear canal and external ear normal.     Nose: Nose normal.     Mouth/Throat:     Pharynx: Oropharynx is clear.  Cardiovascular:     Rate and Rhythm: Normal rate and regular rhythm.     Heart sounds: Normal heart sounds. No murmur heard. Pulmonary:     Effort: Pulmonary effort is normal. No respiratory distress.     Breath sounds: Wheezing (diffusely) present.  Skin:    Findings: Rash (erythema BL inferior to breasts.) present.  Neurological:     Mental Status: She is alert and oriented to person, place, and time.  Psychiatric:        Mood and Affect: Mood normal.        Behavior: Behavior normal.    BP 122/70    Pulse 95    Temp 97.8 F (36.6 C)    Resp 18    Ht 5' 3"  (1.6 m)    Wt 241 lb 3.2 oz (109.4 kg)    SpO2 97%    BMI 42.73 kg/m  Wt Readings from Last 3 Encounters:  04/23/21 241 lb 3.2 oz (109.4 kg)  04/18/21 238 lb (108 kg)  03/29/21 238 lb (108 kg)    Health Maintenance Due  Topic Date Due   TETANUS/TDAP  Never done   PAP SMEAR-Modifier  Never done   COLONOSCOPY (Pts 45-25yr Insurance coverage  will need to be confirmed)  Never done   Zoster Vaccines- Shingrix (1 of 2) Never done   INFLUENZA VACCINE  10/09/2020   COVID-19 Vaccine (5 - Booster for [place] 10/10/2020    There are no preventive care reminders to display for this patient.   Lab Results  Component Value Date   TSH 1.000 03/29/2021   Lab Results  Component Value Date   WBC 9.7 03/29/2021   HGB  13.7 03/29/2021   HCT 40.4 03/29/2021   MCV 88 03/29/2021   PLT 294 03/29/2021   Lab Results  Component Value Date   NA 140 03/29/2021   K 4.2 03/29/2021   CO2 25 03/29/2021   GLUCOSE 86 03/29/2021   BUN 10 03/29/2021   CREATININE 1.03 (H) 03/29/2021   BILITOT 0.4 03/29/2021   ALKPHOS 89 03/29/2021   AST 20 03/29/2021   ALT 29 03/29/2021   PROT 6.7 03/29/2021   ALBUMIN 4.4 03/29/2021   CALCIUM 9.2 03/29/2021   EGFR 65 03/29/2021   Lab Results  Component Value Date   CHOL 160 03/07/2021   Lab Results  Component Value Date   HDL 48 03/07/2021   Lab Results  Component Value Date   LDLCALC 95 03/07/2021   Lab Results  Component Value Date   TRIG 88 03/07/2021   Lab Results  Component Value Date   CHOLHDL 3.3 03/07/2021   Lab Results  Component Value Date   HGBA1C 5.3 08/25/2017       Assessment & Plan:   Problem List Items Addressed This Visit       Respiratory   Acute bronchitis due to other specified organisms    Continue augmentin. Start predpack (prednisone) Given shot of rocephin and kenalog Ordered chest xray.  Continue other medicines.       Relevant Medications   fluconazole (DIFLUCAN) 100 MG tablet   Other acute recurrent sinusitis    Continue augmentin. Given shot of rocephin Continue other medicines.       Relevant Medications   fluconazole (DIFLUCAN) 100 MG tablet     Musculoskeletal and Integument   Candidiasis, intertrigo    Start on fluconazole       Relevant Medications   fluconazole (DIFLUCAN) 100 MG tablet     Other   Subacute cough -  Primary    Order chest xray      Relevant Orders   DG Chest 2 View (Completed)   Meds ordered this encounter  Medications   fluconazole (DIFLUCAN) 100 MG tablet    Sig: Take 1 tablet (100 mg total) by mouth daily.    Dispense:  7 tablet    Refill:  0   predniSONE (DELTASONE) 10 MG tablet    Sig: Take 6 tablets (60 mg total) by mouth daily with breakfast for 1 day, THEN 5 tablets (50 mg total) daily with breakfast for 1 day, THEN 4 tablets (40 mg total) daily with breakfast for 1 day, THEN 3 tablets (30 mg total) daily with breakfast for 1 day, THEN 2 tablets (20 mg total) daily with breakfast for 1 day, THEN 1 tablet (10 mg total) daily with breakfast for 1 day.    Dispense:  21 tablet    Refill:  0   triamcinolone acetonide (KENALOG-40) injection 80 mg   cefTRIAXone (ROCEPHIN) injection 1 g    Orders Placed This Encounter  Procedures   DG Chest 2 View     Follow-up: No follow-ups on file.  An After Visit Summary was printed and given to the patient.  Rochel Brome, MD [facility] [phone]

## 2021-04-24 ENCOUNTER — Other Ambulatory Visit: Payer: Self-pay

## 2021-04-24 DIAGNOSIS — R052 Subacute cough: Secondary | ICD-10-CM

## 2021-04-25 ENCOUNTER — Telehealth: Payer: Self-pay

## 2021-04-25 NOTE — Telephone Encounter (Signed)
Patient called requesting something be sent to [facility] [place] for cough.  She was seen two days ago.

## 2021-04-26 ENCOUNTER — Other Ambulatory Visit: Payer: Self-pay | Admitting: Family Medicine

## 2021-04-26 NOTE — Telephone Encounter (Signed)
Patient notified

## 2021-04-30 ENCOUNTER — Other Ambulatory Visit: Payer: Self-pay

## 2021-04-30 MED ORDER — LISDEXAMFETAMINE DIMESYLATE 50 MG PO CAPS: 50.0000 mg | ORAL_CAPSULE | Freq: Every day | ORAL | 0 refills | Status: DC

## 2021-05-01 ENCOUNTER — Encounter: Payer: Self-pay | Admitting: Family Medicine

## 2021-05-01 DIAGNOSIS — J0181 Other acute recurrent sinusitis: Secondary | ICD-10-CM | POA: Insufficient documentation

## 2021-05-01 DIAGNOSIS — J0101 Acute recurrent maxillary sinusitis: Secondary | ICD-10-CM | POA: Insufficient documentation

## 2021-05-01 DIAGNOSIS — J019 Acute sinusitis, unspecified: Secondary | ICD-10-CM | POA: Insufficient documentation

## 2021-05-01 DIAGNOSIS — J0100 Acute maxillary sinusitis, unspecified: Secondary | ICD-10-CM | POA: Insufficient documentation

## 2021-05-01 NOTE — Assessment & Plan Note (Signed)
Continue augmentin. Given shot of rocephin Continue other medicines.

## 2021-05-01 NOTE — Assessment & Plan Note (Signed)
Continue augmentin. °Start predpack (prednisone) °Given shot of rocephin and kenalog °Ordered chest xray.  °Continue other medicines.  °

## 2021-05-01 NOTE — Assessment & Plan Note (Signed)
Start on fluconazole

## 2021-05-01 NOTE — Assessment & Plan Note (Signed)
Order chest xray. 

## 2021-05-08 ENCOUNTER — Other Ambulatory Visit: Payer: Self-pay | Admitting: Physician Assistant

## 2021-05-15 ENCOUNTER — Other Ambulatory Visit: Payer: Self-pay | Admitting: Nurse Practitioner

## 2021-05-15 DIAGNOSIS — J4521 Mild intermittent asthma with (acute) exacerbation: Secondary | ICD-10-CM

## 2021-05-25 ENCOUNTER — Other Ambulatory Visit: Payer: Self-pay

## 2021-05-25 MED ORDER — LISDEXAMFETAMINE DIMESYLATE 50 MG PO CAPS: 50.0000 mg | ORAL_CAPSULE | Freq: Every day | ORAL | 0 refills | Status: DC

## 2021-05-31 ENCOUNTER — Telehealth: Payer: Self-pay

## 2021-05-31 NOTE — Telephone Encounter (Signed)
Patient has an appointment with Dr Sedalia Muta. ?

## 2021-05-31 NOTE — Telephone Encounter (Signed)
What are we treating, may need to be seen ?lp ?

## 2021-05-31 NOTE — Telephone Encounter (Signed)
Patient is requesting valcyclovir be sent to [facility] [place]. Not on current med list.  ? ?Anna [name] 05/31/21 1:55 PM ? ?

## 2021-06-07 NOTE — Progress Notes (Signed)
? ?Subjective:  ?Patient ID: Anna [name], female    DOB: 12-15-1970  Age: 51 y.o. MRN: [id number] ? ?Chief Complaint  ?Patient presents with  ? Hypertension  ? Hyperlipidemia  ? ?HPI ?Hyperlipidemia:  ?Patient is currently taking Pravastatin 40 mg take 1 tablet daily, Lovaza 1 gm take 1 capsule by mouth twice daily. ? ?Hypertension: ?Patient is currently taking Amlodipine/Olmesartan 5-20 mg take 1 tablet daily. ? ?ADHD: Vyvanse 50 mg take 1 capsule by mouth daily. ? ?Bipolar Disorder: Vraylar 6 mg take 1 capsule by mouth daily, Paroxetine 37.5 mg take 1 tablet daily ? ?HRT replacement: on compounded  ? ?Diet: not eating healthy.  ?Not exercising. ?  ?Current Outpatient Medications on File Prior to Visit  ?Medication Sig Dispense Refill  ? albuterol (VENTOLIN HFA) 108 (90 Base) MCG/ACT inhaler TAKE 2 PUFFS BY MOUTH EVERY 6 HOURS AS NEEDED FOR WHEEZE OR SHORTNESS OF BREATH 18 each 1  ? ALPRAZolam (XANAX) 0.5 MG tablet TAKE 1 TABLET BY MOUTH DAILY AS NEEDED FOR SEVERE ANXIETY 30 tablet 3  ? amLODipine-olmesartan (AZOR) 5-20 MG tablet TAKE 1 TABLET BY MOUTH EVERY DAY 90 tablet 0  ? Cariprazine HCl (VRAYLAR) 6 MG CAPS Take 1 capsule (6 mg total) by mouth daily. 90 capsule 1  ? fluticasone (FLONASE) 50 MCG/ACT nasal spray Place 2 sprays into both nostrils daily. 16 g 6  ? Fluticasone-Umeclidin-Vilant (TRELEGY ELLIPTA) 100-62.5-25 MCG/ACT AEPB Inhale 1 puff into the lungs daily. 1 each 11  ? lisdexamfetamine (VYVANSE) 50 MG capsule Take 1 capsule (50 mg total) by mouth daily. 30 capsule 0  ? omega-3 acid ethyl esters (LOVAZA) 1 g capsule TAKE 1 CAPSULE BY MOUTH TWICE A DAY 180 capsule 0  ? PARoxetine (PAXIL-CR) 37.5 MG 24 hr tablet TAKE 1 TABLET BY MOUTH EVERY DAY 90 tablet 1  ? pravastatin (PRAVACHOL) 40 MG tablet TAKE 1 TABLET BY MOUTH EVERY DAY 90 tablet 1  ? traZODone (DESYREL) 50 MG tablet TAKE 1 TABLET BY MOUTH EVERYDAY AT BEDTIME 90 tablet 3  ? ?No current facility-administered medications on file prior to visit.   ? ?Past Medical History:  ?Diagnosis Date  ? ADD (attention deficit disorder)   ? Arthritis   ? Asthma   ? seasonal  ? Bipolar affective disorder, current episode depressed (HCC)   ? GERD (gastroesophageal reflux disease)   ? occ  ? History of recurrent UTIs 03/11/2012  ? Hypertension   ? ?Past Surgical History:  ?Procedure Laterality Date  ? ABDOMINAL HYSTERECTOMY  2010  ? endometriosis. Total hysterectomy  ? ANTERIOR CERVICAL DECOMP/DISCECTOMY FUSION N/A 07/13/2012  ? Procedure: ANTERIOR CERVICAL DECOMPRESSION/DISCECTOMY FUSION 1 LEVEL;  Surgeon: Mariam Dollar, MD;  Location: [facility];  Service: Neurosurgery;  Laterality: N/A;  Anterior Cervical Decompression/Discectomy Cervical Three-Four  ? CARPAL TUNNEL RELEASE Right   ? CERVICAL DISC SURGERY  09  ? CHOLECYSTECTOMY    ?  ?Family History  ?Problem Relation Age of Onset  ? Breast cancer Neg Hx   ? ?Social History  ? ?Socioeconomic History  ? Marital status: Married  ?  Spouse name: Not on file  ? Number of children: Not on file  ? Years of education: Not on file  ? Highest education level: Not on file  ?Occupational History  ? Not on file  ?Tobacco Use  ? Smoking status: Never  ? Smokeless tobacco: Never  ?Vaping Use  ? Vaping Use: Never used  ?Substance and Sexual Activity  ? Alcohol use: No  ?  Drug use: No  ?  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 Sexual activity: Not on file  ?Other Topics Concern  ? Not on file  ?Social History Narrative  ? Not on file  ? ?Social Determinants of Health  ? ?Financial Resource Strain: Not on file  ?Food Insecurity: Not on file  ?Transportation Needs: Not on file  ?Physical Activity: Not on file  ?Stress: Not on file  ?Social Connections: Not on file  ? ? ?Review of Systems  ?Constitutional:  Negative for appetite change, fatigue and fever.  ?HENT:  Negative for congestion, ear pain, sinus pressure and sore throat.   ?Respiratory:  Negative for cough, chest tightness, shortness of breath and wheezing.    ?Cardiovascular:  Negative for chest pain and palpitations.  ?Gastrointestinal:  Negative for abdominal pain, constipation, diarrhea, nausea and vomiting.  ?Genitourinary:  Negative for dysuria and hematuria.  ?Musculoskeletal:  Negative for arthralgias, back pain, joint swelling and myalgias.  ?Skin:  Negative for rash.  ?Neurological:  Negative for dizziness, weakness and headaches.  ?Psychiatric/Behavioral:  Negative for dysphoric mood. The patient is not nervous/anxious.   ? ? ?Objective:  ?BP 120/72   Pulse 84   Temp (!) 96.2 ?F (35.7 ?C)   Resp 16   Ht 5\' 3"  (1.6 m)   Wt 244 lb (110.7 kg)   BMI 43.22 kg/m?  ? ? ?  06/08/2021  ?  7:33 AM 04/23/2021  ?  1:56 PM 04/18/2021  ?  9:35 AM  ?BP/Weight  ?Systolic BP 120 122 128  ?Diastolic BP 72 70 84  ?Wt. (Lbs) 244 241.2 238  ?BMI 43.22 kg/m2 42.73 kg/m2 42.16 kg/m2  ? ? ?Physical Exam ?Vitals reviewed.  ?Constitutional:   ?   Appearance: Normal appearance. She is normal weight.  ?Neck:  ?   Vascular: No carotid bruit.  ?Cardiovascular:  ?   Rate and Rhythm: Normal rate and regular rhythm.  ?   Heart sounds: Normal heart sounds.  ?Pulmonary:  ?   Effort: Pulmonary effort is normal. No respiratory distress.  ?   Breath sounds: Normal breath sounds.  ?Abdominal:  ?   General: Abdomen is flat. Bowel sounds are normal.  ?   Palpations: Abdomen is soft.  ?   Tenderness: There is no abdominal tenderness.  ?Neurological:  ?   Mental Status: She is alert and oriented to person, place, and time.  ?Psychiatric:     ?   Mood and Affect: Mood normal.     ?   Behavior: Behavior normal.  ? ? ?Diabetic Foot Exam - Simple   ?No data filed ?  ?  ? ?Lab Results  ?Component Value Date  ? WBC 9.7 03/29/2021  ? HGB 13.7 03/29/2021  ? HCT 40.4 03/29/2021  ? PLT 294 03/29/2021  ? GLUCOSE 86 03/29/2021  ? CHOL 160 03/07/2021  ? TRIG 88 03/07/2021  ? HDL 48 03/07/2021  ? LDLCALC 95 03/07/2021  ? ALT 29 03/29/2021  ? AST 20 03/29/2021  ? NA 140 03/29/2021  ? K 4.2 03/29/2021  ? CL 103  03/29/2021  ? CREATININE 1.03 (H) 03/29/2021  ? BUN 10 03/29/2021  ? CO2 25 03/29/2021  ? TSH 1.000 03/29/2021  ? HGBA1C 5.3 08/25/2017  ? ? ? ? ?Assessment & Plan:  ? ?Problem List Items Addressed This Visit   ? ?  ? Cardiovascular and Mediastinum  ? Essential hypertension, benign - Primary  ?  Well controlled.  ?No changes to  medicines.  ?Continue to work on eating a healthy diet and exercise.  ?Labs drawn today.  ? ?  ?  ? Relevant Orders  ? CBC With Diff/Platelet  ? Comprehensive metabolic panel  ?  ? Other  ? Attention deficit hyperactivity disorder (ADHD), combined type (Chronic)  ?  The current medical regimen is effective;  continue present plan and medications. ? ?  ?  ? Bipolar I disorder, most recent episode (or current) manic, moderate (HCC)  ?  Well controlled.  ?No changes to medicines.  ? ?  ?  ? Class 3 severe obesity due to excess calories with serious comorbidity and body mass index (BMI) of 40.0 to 44.9 in adult [facility](HCC)  ?  Recommend continue to work on eating healthy diet and exercise. ? ?  ?  ? Mixed hyperlipidemia  ?  Well controlled.  ?No changes to medicines.  ?Continue to work on eating a healthy diet and exercise.  ?Labs drawn today.  ? ?  ?  ? Relevant Orders  ? Lipid panel  ?. ? ?Meds ordered this encounter  ?Medications  ? valACYclovir (VALTREX) 500 MG tablet  ?  Sig: Take 1 tablet (500 mg total) by mouth 2 (two) times daily.  ?  Dispense:  14 tablet  ?  Refill:  1  ? ? ?Orders Placed This Encounter  ?Procedures  ? CBC With Diff/Platelet  ? Comprehensive metabolic panel  ? Lipid panel  ?  ? ?Follow-up: Return in about 3 months (around 09/07/2021) for chronic fasting. ? ?An After Visit Summary was printed and given to the patient. ? ?I,Lauren M Auman,acting as a scribe for Blane OharaKirsten Stockard, MD.,have documented all relevant documentation on the behalf of Blane OharaKirsten Plitt, MD,as directed by  Blane OharaKirsten Haith, MD while in the presence of Blane OharaKirsten Gerry, MD.  ? ? ?Blane OharaKirsten Shanley, MD ?[facility] ?[phone]  [phone] ?

## 2021-06-08 ENCOUNTER — Telehealth: Payer: Self-pay

## 2021-06-08 ENCOUNTER — Encounter: Payer: Self-pay | Admitting: Family Medicine

## 2021-06-08 ENCOUNTER — Ambulatory Visit: Payer: BC Managed Care – PPO | Admitting: Family Medicine

## 2021-06-08 VITALS — BP 120/72 | HR 84 | Temp 96.2°F | Resp 16 | Ht 63.0 in | Wt 244.0 lb

## 2021-06-08 DIAGNOSIS — E782 Mixed hyperlipidemia: Secondary | ICD-10-CM | POA: Diagnosis not present

## 2021-06-08 DIAGNOSIS — F3112 Bipolar disorder, current episode manic without psychotic features, moderate: Secondary | ICD-10-CM | POA: Diagnosis not present

## 2021-06-08 DIAGNOSIS — F902 Attention-deficit hyperactivity disorder, combined type: Secondary | ICD-10-CM | POA: Diagnosis not present

## 2021-06-08 DIAGNOSIS — I10 Essential (primary) hypertension: Secondary | ICD-10-CM | POA: Diagnosis not present

## 2021-06-08 DIAGNOSIS — Z6841 Body Mass Index (BMI) 40.0 and over, adult: Secondary | ICD-10-CM

## 2021-06-08 MED ORDER — VALACYCLOVIR HCL 500 MG PO TABS: 500.0000 mg | ORAL_TABLET | Freq: Two times a day (BID) | ORAL | 1 refills | Status: DC

## 2021-06-08 NOTE — Assessment & Plan Note (Signed)
Well controlled.  ?No changes to medicines.  ?Continue to work on eating a healthy diet and exercise.  ?Labs drawn today.  ?

## 2021-06-08 NOTE — Assessment & Plan Note (Signed)
Recommend continue to work on eating healthy diet and exercise.  

## 2021-06-08 NOTE — Assessment & Plan Note (Signed)
The current medical regimen is effective;  continue present plan and medications.  

## 2021-06-08 NOTE — Telephone Encounter (Signed)
Refills on RX # [id number] called to [facility] for 6 months of medication.   ?

## 2021-06-08 NOTE — Assessment & Plan Note (Signed)
Well controlled.  No changes to medicines.  .   

## 2021-06-09 LAB — COMPREHENSIVE METABOLIC PANEL
ALT: 28 IU/L (ref 0–32)
AST: 30 IU/L (ref 0–40)
Albumin/Globulin Ratio: 1.9 (ref 1.2–2.2)
Albumin: 4.4 g/dL (ref 3.8–4.9)
Alkaline Phosphatase: 93 IU/L (ref 44–121)
BUN/Creatinine Ratio: 8 — ABNORMAL LOW (ref 9–23)
BUN: 9 mg/dL (ref 6–24)
Bilirubin Total: 0.4 mg/dL (ref 0.0–1.2)
CO2: 25 mmol/L (ref 20–29)
Calcium: 9.4 mg/dL (ref 8.7–10.2)
Chloride: 102 mmol/L (ref 96–106)
Creatinine, Ser: 1.09 mg/dL — ABNORMAL HIGH (ref 0.57–1.00)
Globulin, Total: 2.3 g/dL (ref 1.5–4.5)
Glucose: 123 mg/dL — ABNORMAL HIGH (ref 70–99)
Potassium: 4.1 mmol/L (ref 3.5–5.2)
Sodium: 140 mmol/L (ref 134–144)
Total Protein: 6.7 g/dL (ref 6.0–8.5)
eGFR: 61 mL/min/{1.73_m2} (ref >59)

## 2021-06-09 LAB — CBC WITH DIFF/PLATELET
Basophils Absolute: 0.1 10*3/uL (ref 0.0–0.2)
Basos: 1 %
EOS (ABSOLUTE): 0.1 10*3/uL (ref 0.0–0.4)
Eos: 2 %
Hematocrit: 39.6 % (ref 34.0–46.6)
Hemoglobin: 13.4 g/dL (ref 11.1–15.9)
Immature Grans (Abs): 0.1 10*3/uL (ref 0.0–0.1)
Immature Granulocytes: 1 %
Lymphocytes Absolute: 2.1 10*3/uL (ref 0.7–3.1)
Lymphs: 27 %
MCH: 29.8 pg (ref 26.6–33.0)
MCHC: 33.8 g/dL (ref 31.5–35.7)
MCV: 88 fL (ref 79–97)
Monocytes Absolute: 0.6 10*3/uL (ref 0.1–0.9)
Monocytes: 7 %
Neutrophils Absolute: 4.9 10*3/uL (ref 1.4–7.0)
Neutrophils: 62 %
Platelets: 296 10*3/uL (ref 150–450)
RBC: 4.49 x10E6/uL (ref 3.77–5.28)
RDW: 12.7 % (ref 11.7–15.4)
WBC: 7.8 10*3/uL (ref 3.4–10.8)

## 2021-06-09 LAB — LIPID PANEL
Chol/HDL Ratio: 2.9 ratio (ref 0.0–4.4)
Cholesterol, Total: 173 mg/dL (ref 100–199)
HDL: 60 mg/dL (ref >39)
LDL Chol Calc (NIH): 100 mg/dL — ABNORMAL HIGH (ref 0–99)
Triglycerides: 71 mg/dL (ref 0–149)
VLDL Cholesterol Cal: 13 mg/dL (ref 5–40)

## 2021-06-09 LAB — CARDIOVASCULAR RISK ASSESSMENT

## 2021-06-22 ENCOUNTER — Other Ambulatory Visit: Payer: Self-pay

## 2021-06-22 MED ORDER — LISDEXAMFETAMINE DIMESYLATE 50 MG PO CAPS: 50.0000 mg | ORAL_CAPSULE | Freq: Every day | ORAL | 0 refills | Status: DC

## 2021-07-12 ENCOUNTER — Other Ambulatory Visit: Payer: Self-pay | Admitting: Family Medicine

## 2021-07-13 ENCOUNTER — Other Ambulatory Visit: Payer: Self-pay | Admitting: Family Medicine

## 2021-07-16 ENCOUNTER — Encounter: Payer: Self-pay | Admitting: Family Medicine

## 2021-07-16 ENCOUNTER — Ambulatory Visit: Payer: BC Managed Care – PPO | Admitting: Family Medicine

## 2021-07-16 VITALS — BP 116/72 | HR 100 | Temp 97.3°F | Resp 18 | Ht 63.5 in | Wt 251.0 lb

## 2021-07-16 DIAGNOSIS — R21 Rash and other nonspecific skin eruption: Secondary | ICD-10-CM | POA: Insufficient documentation

## 2021-07-16 DIAGNOSIS — Z6841 Body Mass Index (BMI) 40.0 and over, adult: Secondary | ICD-10-CM

## 2021-07-16 DIAGNOSIS — E782 Mixed hyperlipidemia: Secondary | ICD-10-CM | POA: Diagnosis not present

## 2021-07-16 DIAGNOSIS — F3161 Bipolar disorder, current episode mixed, mild: Secondary | ICD-10-CM

## 2021-07-16 DIAGNOSIS — I10 Essential (primary) hypertension: Secondary | ICD-10-CM | POA: Diagnosis not present

## 2021-07-16 DIAGNOSIS — R400 Somnolence: Secondary | ICD-10-CM

## 2021-07-16 DIAGNOSIS — R5383 Other fatigue: Secondary | ICD-10-CM | POA: Diagnosis not present

## 2021-07-16 MED ORDER — TRIAMCINOLONE ACETONIDE 0.1 % EX CREA: 1.0000 "application " | TOPICAL_CREAM | Freq: Two times a day (BID) | CUTANEOUS | 1 refills | Status: AC

## 2021-07-16 MED ORDER — CARIPRAZINE HCL 3 MG PO CAPS: 3.0000 mg | ORAL_CAPSULE | Freq: Every day | ORAL | 0 refills | Status: DC

## 2021-07-16 NOTE — Assessment & Plan Note (Signed)
The current medical regimen is effective;  continue present plan and medications.  

## 2021-07-16 NOTE — Assessment & Plan Note (Signed)
Decrease vraylar 3 mg one daily.  ?

## 2021-07-16 NOTE — Assessment & Plan Note (Signed)
Needs home sleep study 

## 2021-07-16 NOTE — Assessment & Plan Note (Signed)
Concerning for shingles, but not painful a tall. Patient understands that if she starts to have pain and if it is spreading, patient should call back for treatment.  ?Start on loratidine and triamcinalone cream for rash.  ?

## 2021-07-16 NOTE — Progress Notes (Signed)
? ?Acute Office Visit ? ?Subjective:  ? ? Patient ID: Anna [name], female    DOB: 06/22/1970, 51 y.o.   MRN: [id number] ? ?Chief Complaint  ?Patient presents with  ? Fatigued  ? ? ?HPI ?Patient is in today for excessive fatigue which has been going on for about 1 month.  She denies feeling anxious or depressed but is sleeping excessively.  Snores at night. No apnea episodes. Daytime somnolence. Sleep long enough at night, but does not feel rested.  ? ?Complaining of rash on left posterior back since yesterday. Itchy, no pain.  ? ?Past Medical History:  ?Diagnosis Date  ? ADD (attention deficit disorder)   ? Arthritis   ? Asthma   ? seasonal  ? Bipolar affective disorder, current episode depressed ([place])   ? GERD (gastroesophageal reflux disease)   ? occ  ? History of recurrent UTIs 03/11/2012  ? Hypertension   ? ? ?Past Surgical History:  ?Procedure Laterality Date  ? ABDOMINAL HYSTERECTOMY  2010  ? endometriosis. Total hysterectomy  ? ANTERIOR CERVICAL DECOMP/DISCECTOMY FUSION N/A 07/13/2012  ? Procedure: ANTERIOR CERVICAL DECOMPRESSION/DISCECTOMY FUSION 1 LEVEL;  Surgeon: Elaina Hoops, MD;  Location: [facility];  Service: Neurosurgery;  Laterality: N/A;  Anterior Cervical Decompression/Discectomy Cervical Three-Four  ? CARPAL TUNNEL RELEASE Right   ? CERVICAL DISC SURGERY  09  ? CHOLECYSTECTOMY    ? ? ?Family History  ?Problem Relation Age of Onset  ? Breast cancer Neg Hx   ? ? ?Social History  ? ?Socioeconomic History  ? Marital status: Married  ?  Spouse name: Not on file  ? Number of children: Not on file  ? Years of education: Not on file  ? Highest education level: Not on file  ?Occupational History  ? Not on file  ?Tobacco Use  ? Smoking status: Never  ? Smokeless tobacco: Never  ?Vaping Use  ? Vaping Use: Never used  ?Substance and Sexual Activity  ? Alcohol use: No  ? Drug use: No  ?  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 Sexual activity: Not on file  ?Other Topics Concern  ? Not on file  ?Social  History Narrative  ? Not on file  ? ?Social Determinants of Health  ? ?Financial Resource Strain: Not on file  ?Food Insecurity: Not on file  ?Transportation Needs: Not on file  ?Physical Activity: Not on file  ?Stress: Not on file  ?Social Connections: Not on file  ?Intimate Partner Violence: Not on file  ? ? ?Outpatient Medications Prior to Visit  ?Medication Sig Dispense Refill  ? albuterol (VENTOLIN HFA) 108 (90 Base) MCG/ACT inhaler TAKE 2 PUFFS BY MOUTH EVERY 6 HOURS AS NEEDED FOR WHEEZE OR SHORTNESS OF BREATH 18 each 1  ? ALPRAZolam (XANAX) 0.5 MG tablet TAKE 1 TABLET BY MOUTH DAILY AS NEEDED FOR SEVERE ANXIETY 30 tablet 3  ? amLODipine-olmesartan (AZOR) 5-20 MG tablet TAKE 1 TABLET BY MOUTH EVERY DAY 90 tablet 0  ? fluticasone (FLONASE) 50 MCG/ACT nasal spray Place 2 sprays into both nostrils daily. 16 g 6  ? Fluticasone-Umeclidin-Vilant (TRELEGY ELLIPTA) 100-62.5-25 MCG/ACT AEPB Inhale 1 puff into the lungs daily. 1 each 11  ? lisdexamfetamine (VYVANSE) 50 MG capsule Take 1 capsule (50 mg total) by mouth daily. 30 capsule 0  ? omega-3 acid ethyl esters (LOVAZA) 1 g capsule TAKE 1 CAPSULE BY MOUTH TWICE A DAY 180 capsule 0  ? PARoxetine (PAXIL-CR) 37.5 MG 24 hr tablet TAKE 1 TABLET BY  MOUTH EVERY DAY 90 tablet 1  ? pravastatin (PRAVACHOL) 40 MG tablet TAKE 1 TABLET BY MOUTH EVERY DAY 90 tablet 1  ? traZODone (DESYREL) 50 MG tablet TAKE 1 TABLET BY MOUTH EVERYDAY AT BEDTIME 90 tablet 3  ? valACYclovir (VALTREX) 500 MG tablet Take 1 tablet (500 mg total) by mouth 2 (two) times daily. 14 tablet 1  ? Cariprazine HCl (VRAYLAR) 6 MG CAPS Take 1 capsule (6 mg total) by mouth daily. 90 capsule 1  ? ?No facility-administered medications prior to visit.  ? ? ?Allergies  ?Allergen Reactions  ? Avelox [Moxifloxacin Hcl In Nacl] Anaphylaxis  ? Lactose Intolerance (Gi) Diarrhea  ? Tape Other (See Comments)  ?  If tape on a while will pull off skin  ? ? ?Review of Systems  ?Constitutional:  Positive for fatigue. Negative  for appetite change and fever.  ?HENT:  Negative for congestion, ear pain, sinus pressure and sore throat.   ?Respiratory:  Negative for shortness of breath and wheezing.   ?Cardiovascular:  Negative for chest pain.  ?Genitourinary:  Negative for dysuria.  ?Skin:  Positive for rash (posterior upper back started yesterday. itchy. not painful. no one else with similar rash. they stayed in hospital).  ?Neurological:  Positive for weakness. Negative for dizziness and headaches.  ?Psychiatric/Behavioral:  Negative for dysphoric mood. The patient is not nervous/anxious.   ? ?   ?Objective:  ?  ?Physical Exam ?Vitals reviewed.  ?Constitutional:   ?   Appearance: Normal appearance. She is obese.  ?Cardiovascular:  ?   Rate and Rhythm: Normal rate and regular rhythm.  ?   Heart sounds: Normal heart sounds.  ?Pulmonary:  ?   Effort: Pulmonary effort is normal.  ?   Breath sounds: Normal breath sounds.  ?Abdominal:  ?   General: Abdomen is flat. Bowel sounds are normal.  ?   Palpations: Abdomen is soft.  ?Skin: ?   Findings: Rash (left side inferior to scapula. several welts concerning for insect bites. Not truly papular. nontender.) present.  ?Neurological:  ?   Mental Status: She is alert and oriented to person, place, and time.  ?Psychiatric:     ?   Mood and Affect: Mood normal.     ?   Behavior: Behavior normal.  ? ? ?BP 116/72   Pulse 100   Temp (!) 97.3 ?F (36.3 ?C)   Resp 18   Ht 5' 3.5" (1.613 m)   Wt 251 lb (113.9 kg)   BMI 43.77 kg/m?  ?Wt Readings from Last 3 Encounters:  ?07/16/21 251 lb (113.9 kg)  ?06/08/21 244 lb (110.7 kg)  ?04/23/21 241 lb 3.2 oz (109.4 kg)  ? ? ?Health Maintenance Due  ?Topic Date Due  ? TETANUS/TDAP  Never done  ? PAP SMEAR-Modifier  Never done  ? COLONOSCOPY (Pts 45-50yr Insurance coverage will need to be confirmed)  Never done  ? Zoster Vaccines- Shingrix (1 of 2) Never done  ? COVID-19 Vaccine (5 - Booster for PCohuttaseries) 10/10/2020  ? ? ?There are no preventive care reminders  to display for this patient. ? ? ?Lab Results  ?Component Value Date  ? TSH 1.000 03/29/2021  ? ?Lab Results  ?Component Value Date  ? WBC 7.8 06/08/2021  ? HGB 13.4 06/08/2021  ? HCT 39.6 06/08/2021  ? MCV 88 06/08/2021  ? PLT 296 06/08/2021  ? ?Lab Results  ?Component Value Date  ? NA 140 06/08/2021  ? K 4.1 06/08/2021  ? CO2 25  06/08/2021  ? GLUCOSE 123 (H) 06/08/2021  ? BUN 9 06/08/2021  ? CREATININE 1.09 (H) 06/08/2021  ? BILITOT 0.4 06/08/2021  ? ALKPHOS 93 06/08/2021  ? AST 30 06/08/2021  ? ALT 28 06/08/2021  ? PROT 6.7 06/08/2021  ? ALBUMIN 4.4 06/08/2021  ? CALCIUM 9.4 06/08/2021  ? EGFR 61 06/08/2021  ? ?Lab Results  ?Component Value Date  ? CHOL 173 06/08/2021  ? ?Lab Results  ?Component Value Date  ? HDL 60 06/08/2021  ? ?Lab Results  ?Component Value Date  ? LDLCALC 100 (H) 06/08/2021  ? ?Lab Results  ?Component Value Date  ? TRIG 71 06/08/2021  ? ?Lab Results  ?Component Value Date  ? CHOLHDL 2.9 06/08/2021  ? ?Lab Results  ?Component Value Date  ? HGBA1C 5.3 08/25/2017  ? ? ? ? ?   ?Assessment & Plan:  ? ?Problem List Items Addressed This Visit   ? ?  ? Cardiovascular and Mediastinum  ? Essential hypertension, benign  ?  The current medical regimen is effective;  continue present plan and medications. ? ? ?  ?  ? Relevant Orders  ? B12 and Folate Panel  ? VITAMIN D 25 Hydroxy (Vit-D Deficiency, Fractures)  ? Methylmalonic acid, serum  ?  ? Musculoskeletal and Integument  ? Rash  ?  Concerning for shingles, but not painful a tall. Patient understands that if she starts to have pain and if it is spreading, patient should call back for treatment.  ?Start on loratidine and triamcinalone cream for rash.  ? ?  ?  ?  ? Other  ? Morbid obesity with BMI of 40.0-44.9, adult ([place])  ?  Needs home sleep study.  ? ?  ?  ? Relevant Orders  ? Home sleep test  ? Mixed hyperlipidemia  ?  The current medical regimen is effective;  continue present plan and medications. ?Recommend continue to work on eating healthy diet  and exercise. ? ? ?  ?  ? Relevant Orders  ? B12 and Folate Panel  ? VITAMIN D 25 Hydroxy (Vit-D Deficiency, Fractures)  ? Methylmalonic acid, serum  ? Other fatigue - Primary  ?  Home sleep study.  ?Check labs

## 2021-07-16 NOTE — Assessment & Plan Note (Signed)
The current medical regimen is effective;  continue present plan and medications. Recommend continue to work on eating healthy diet and exercise.  

## 2021-07-16 NOTE — Assessment & Plan Note (Signed)
Home sleep study 

## 2021-07-16 NOTE — Patient Instructions (Signed)
Decrease vraylar to 3 mg daily.  ?

## 2021-07-16 NOTE — Assessment & Plan Note (Signed)
Home sleep study.  ?Check labs. ?

## 2021-07-18 ENCOUNTER — Other Ambulatory Visit: Payer: Self-pay | Admitting: Family Medicine

## 2021-07-18 LAB — B12 AND FOLATE PANEL
Folate: 5.5 ng/mL (ref >3.0)
Vitamin B-12: >2000 pg/mL — ABNORMAL HIGH (ref 232–1245)

## 2021-07-18 LAB — VITAMIN D 25 HYDROXY (VIT D DEFICIENCY, FRACTURES): Vit D, 25-Hydroxy: 9.9 ng/mL — ABNORMAL LOW (ref 30.0–100.0)

## 2021-07-18 LAB — METHYLMALONIC ACID, SERUM: Methylmalonic Acid: 147 nmol/L (ref 0–378)

## 2021-07-19 ENCOUNTER — Other Ambulatory Visit: Payer: Self-pay | Admitting: Family Medicine

## 2021-07-19 MED ORDER — VITAMIN D (ERGOCALCIFEROL) 1.25 MG (50000 UNIT) PO CAPS: 50000.0000 [IU] | ORAL_CAPSULE | ORAL | 0 refills | Status: DC

## 2021-07-20 ENCOUNTER — Other Ambulatory Visit: Payer: Self-pay

## 2021-07-25 ENCOUNTER — Other Ambulatory Visit: Payer: Self-pay

## 2021-07-25 MED ORDER — LISDEXAMFETAMINE DIMESYLATE 50 MG PO CAPS: 50.0000 mg | ORAL_CAPSULE | Freq: Every day | ORAL | 0 refills | Status: DC

## 2021-08-12 ENCOUNTER — Other Ambulatory Visit: Payer: Self-pay | Admitting: Family Medicine

## 2021-08-16 ENCOUNTER — Other Ambulatory Visit: Payer: Self-pay

## 2021-08-16 MED ORDER — LISDEXAMFETAMINE DIMESYLATE 50 MG PO CAPS
50.0000 mg | ORAL_CAPSULE | Freq: Every day | ORAL | 0 refills | Status: DC
Start: 2021-08-16 — End: 2021-09-13

## 2021-09-10 NOTE — Progress Notes (Unsigned)
Subjective:  Patient ID: Anna [name], female    DOB: [birth year]  Age: 51 y.o. 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  She is eating healthier and walking.  Hyperlipidemia:  Patient is currently taking Pravastatin 40 mg take 1 tablet daily, Lovaza 1 gm take 1 capsule by mouth twice daily.  Hypertension: Patient is currently taking Amlodipine/Olmesartan 5-20 mg take 1 tablet daily.  ADHD: Vyvanse 50 mg take 1 capsule by mouth daily.  Helps with attention.  Bipolar Disorder: Vraylar 3 mg take 1 capsule by mouth daily, Paroxetine 37.5 mg take 1 tablet daily.  Symptoms have improved.  She was significantly depressed at her last visit.  The change in Vraylar has significantly helped.     09/17/2021    8:05 PM 03/07/2021    8:43 AM  PHQ9 SCORE ONLY  PHQ-9 Total Score 1 1    HRT replacement: on compounded formula.  Vitamin D deficiency: Patient is currently taking Vitamin D 50,000 units twice weekly.    Current Outpatient Medications on File Prior to Visit  Medication Sig Dispense Refill   albuterol (VENTOLIN HFA) 108 (90 Base) MCG/ACT inhaler TAKE 2 PUFFS BY MOUTH EVERY 6 HOURS AS NEEDED FOR WHEEZE OR SHORTNESS OF BREATH 18 each 1   ALPRAZolam (XANAX) 0.5 MG tablet TAKE 1 TABLET BY MOUTH DAILY AS NEEDED FOR SEVERE ANXIETY 30 tablet 2   fluticasone (FLONASE) 50 MCG/ACT nasal spray Place 2 sprays into both nostrils daily. 16 g 6   omega-3 acid ethyl esters (LOVAZA) 1 g capsule TAKE 1 CAPSULE BY MOUTH TWICE A DAY 180 capsule 0   PARoxetine (PAXIL-CR) 37.5 MG 24 hr tablet TAKE 1 TABLET BY MOUTH EVERY DAY 90 tablet 1   triamcinolone cream (KENALOG) 0.1 % Apply 1 application. topically 2 (two) times daily. 45 g 1   valACYclovir (VALTREX) 500 MG tablet Take 1 tablet (500 mg total) by mouth 2 (two) times daily. 14 tablet 1   Vitamin D, Ergocalciferol,  (DRISDOL) 1.25 MG (50000 UNIT) CAPS capsule Take 1 capsule (50,000 Units total) by mouth 2 (two) times a week. 24 capsule 0   VRAYLAR 3 MG capsule TAKE 1 CAPSULE BY MOUTH DAILY. 90 capsule 1   No current facility-administered medications on file prior to visit.   Past Medical History:  Diagnosis Date   ADD (attention deficit disorder)    Arthritis    Asthma    seasonal   Bipolar affective disorder, current episode depressed (HCC)    GERD (gastroesophageal reflux disease)    occ   History of recurrent UTIs 03/11/2012   Hypertension    Past Surgical History:  Procedure Laterality Date   ABDOMINAL HYSTERECTOMY  2010   endometriosis. Total hysterectomy   ANTERIOR CERVICAL DECOMP/DISCECTOMY FUSION N/A 07/13/2012   Procedure: ANTERIOR CERVICAL DECOMPRESSION/DISCECTOMY FUSION 1 LEVEL;  Surgeon: Mariam Dollar, MD;  Location: [facility];  Service: Neurosurgery;  Laterality: N/A;  Anterior Cervical Decompression/Discectomy Cervical Three-Four   CARPAL TUNNEL RELEASE Right    CERVICAL DISC SURGERY  09   CHOLECYSTECTOMY      Family History  Problem Relation Age of Onset   Breast cancer Neg Hx    Social History   Socioeconomic History   Marital status: Married    Spouse name: Not on file   Number of children: Not on file   Years  of education: Not on file   Highest education level: Not on file  Occupational History   Not on file  Tobacco Use   Smoking status: Never   Smokeless tobacco: Never  Vaping Use   Vaping Use: Never used  Substance and Sexual Activity   Alcohol use: No   Drug use: No    Comment: 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  HENT:  Negative for congestion, ear pain, sinus pressure and sore throat.   Respiratory:  Negative for cough, chest tightness, shortness of breath and wheezing.   Cardiovascular:  Negative for chest pain and palpitations.  Gastrointestinal:  Negative for abdominal pain, constipation, diarrhea, nausea and vomiting.  Genitourinary:  Negative for dysuria and hematuria.  Musculoskeletal:  Positive for neck pain (Taking ibuprofen 200 mg 4 qhs. Pain radiates out to shoulders.). Negative for arthralgias, back pain, joint swelling and myalgias.  Skin:  Negative for rash.  Neurological:  Negative for dizziness, weakness and headaches.  Psychiatric/Behavioral:  Negative for dysphoric mood. The patient is not nervous/anxious.      Objective:  BP 120/76   Pulse 78   Temp (!) 97.4 F (36.3 C)   Resp 18   Ht 5' 3.5" (1.613 m)   Wt 251 lb (113.9 kg)   BMI 43.77 kg/m      09/13/2021    7:56 AM 07/16/2021    9:52 AM 06/08/2021    7:33 AM  BP/Weight  Systolic BP 120 116 120  Diastolic BP 76 72 72  Wt. (Lbs) 251 251 244  BMI 43.77 kg/m2 43.77 kg/m2 43.22 kg/m2    Physical Exam Vitals reviewed.  Constitutional:      Appearance: Normal appearance. She is obese.  Cardiovascular:     Rate and Rhythm: Normal rate and regular rhythm.     Heart sounds: Normal heart sounds.  Pulmonary:     Effort: Pulmonary effort is normal.     Breath sounds: Normal breath sounds.  Abdominal:     General: Abdomen is flat. Bowel sounds are normal.     Palpations: Abdomen is soft.  Neurological:     Mental Status: She is alert and oriented to person, place, and time.  Psychiatric:        Mood and Affect: Mood normal.        Behavior: Behavior normal.     Diabetic Foot Exam - Simple   No data filed      Lab Results  Component Value Date   WBC 8.3 09/13/2021   HGB 14.1 09/13/2021   HCT 42.3 09/13/2021   PLT  298 09/13/2021   GLUCOSE 111 (H) 09/13/2021   CHOL 222 (H) 09/13/2021   TRIG 126 09/13/2021   HDL 55 09/13/2021   LDLCALC 145 (H) 09/13/2021   ALT 29 09/13/2021   AST 24 09/13/2021   NA 138 09/13/2021   K 4.2 09/13/2021   CL 101 09/13/2021   CREATININE 1.16 (H) 09/13/2021   BUN 11 09/13/2021   CO2 23 09/13/2021  TSH 1.000 03/29/2021   HGBA1C 6.1 (H) 09/13/2021      Assessment & Plan:   Problem List Items Addressed This Visit       Cardiovascular and Mediastinum   Essential hypertension, benign    Well controlled.  No changes to medicines.  Continue to work on eating a healthy diet and exercise.  Labs drawn today.       Relevant Orders   CBC With Diff/Platelet (Completed)   Comprehensive metabolic panel (Completed)     Other   Attention deficit hyperactivity disorder (ADHD), combined type (Chronic)    The current medical regimen is effective;  continue present plan and medications.  Continue Vyvanse 50 mg every morning      Relevant Medications   lisdexamfetamine (VYVANSE) 50 MG capsule   Bipolar 1 disorder, depressed, mild (HCC)    The current medical regimen is effective;  continue present plan and medications.  ContinueVraylar 3 mg take 1 capsule by mouth daily, Paroxetine 37.5 mg take 1 tablet daily.       Morbid obesity with BMI of 40.0-44.9, adult (HCC)    Recommend continue to work on eating healthy diet and exercise. Wegovy 0.25 mg once weekly.  Patient to call back to let us know how she is doing.      Relevant Medications   lisdexamfetamine (VYVANSE) 50 MG capsule   Semaglutide-Weight Management (WEGOVY) 0.25 MG/0.5ML SOAJ   Mixed hyperlipidemia - Primary    Worsened.  Change pravastatin 40 mg nightly to Crestor 20 mg nightly.  Continue Lovaza. Continue to work on eating a healthy diet and exercise.  Labs drawn today.       Relevant Orders   CBC With Diff/Platelet (Completed)   Lipid panel (Completed)   Vitamin D deficiency    Check vitamin  D.      Elevated glucose    Check labs.      Relevant Orders   Hemoglobin A1c (Completed)   Neck pain    Start on Flexeril 5 mg 3 times daily as needed neck pain. Start on meloxicam 7.5 mg 1 twice daily as needed. Neck exercises      Relevant Medications   cyclobenzaprine (FLEXERIL) 5 MG tablet   meloxicam (MOBIC) 7.5 MG tablet  .  Meds ordered this encounter  Medications   lisdexamfetamine (VYVANSE) 50 MG capsule    Sig: Take 1 capsule (50 mg total) by mouth daily.    Dispense:  30 capsule    Refill:  0   cyclobenzaprine (FLEXERIL) 5 MG tablet    Sig: Take 1 tablet (5 mg total) by mouth 3 (three) times daily as needed for muscle spasms.    Dispense:  60 tablet    Refill:  1   meloxicam (MOBIC) 7.5 MG tablet    Sig: One twice daily as needed for neck pain. Take with food.    Dispense:  30 tablet    Refill:  0   Semaglutide-Weight Management (WEGOVY) 0.25 MG/0.5ML SOAJ    Sig: Inject 0.25 mg into the skin once a week.    Dispense:  2 mL    Refill:  0    Orders Placed This Encounter  Procedures   CBC With Diff/Platelet   Comprehensive metabolic panel   Lipid panel   Hemoglobin A1c   I,Sundae Maners Selner,acting as a scribe for Blane Ohara, MD.,have documented all relevant documentation on the behalf of Blane Ohara, MD,as directed by  Blane Ohara, MD while in the presence of Creola Krotz  Gundrum, MD.    Follow-up: Return in about 3 months (around 12/14/2021) for chronic fasting.  An After Visit Summary was printed and given to the patient.  Blane Ohara, MD [facility] [phone]

## 2021-09-13 ENCOUNTER — Ambulatory Visit: Payer: BC Managed Care – PPO | Admitting: Family Medicine

## 2021-09-13 VITALS — BP 120/76 | HR 78 | Temp 97.4°F | Resp 18 | Ht 63.5 in | Wt 251.0 lb

## 2021-09-13 DIAGNOSIS — I10 Essential (primary) hypertension: Secondary | ICD-10-CM | POA: Diagnosis not present

## 2021-09-13 DIAGNOSIS — E782 Mixed hyperlipidemia: Secondary | ICD-10-CM | POA: Diagnosis not present

## 2021-09-13 DIAGNOSIS — F3112 Bipolar disorder, current episode manic without psychotic features, moderate: Secondary | ICD-10-CM

## 2021-09-13 DIAGNOSIS — F3131 Bipolar disorder, current episode depressed, mild: Secondary | ICD-10-CM | POA: Diagnosis not present

## 2021-09-13 DIAGNOSIS — M542 Cervicalgia: Secondary | ICD-10-CM

## 2021-09-13 DIAGNOSIS — E559 Vitamin D deficiency, unspecified: Secondary | ICD-10-CM

## 2021-09-13 DIAGNOSIS — F902 Attention-deficit hyperactivity disorder, combined type: Secondary | ICD-10-CM

## 2021-09-13 DIAGNOSIS — Z6841 Body Mass Index (BMI) 40.0 and over, adult: Secondary | ICD-10-CM

## 2021-09-13 DIAGNOSIS — R7309 Other abnormal glucose: Secondary | ICD-10-CM

## 2021-09-13 MED ORDER — LISDEXAMFETAMINE DIMESYLATE 50 MG PO CAPS: 50.0000 mg | ORAL_CAPSULE | Freq: Every day | ORAL | 0 refills | Status: DC

## 2021-09-13 MED ORDER — MELOXICAM 7.5 MG PO TABS: ORAL_TABLET | ORAL | 0 refills | Status: DC

## 2021-09-13 MED ORDER — WEGOVY 0.25 MG/0.5ML ~~LOC~~ SOAJ: 0.2500 mg | SUBCUTANEOUS | 0 refills | Status: DC

## 2021-09-13 MED ORDER — CYCLOBENZAPRINE HCL 5 MG PO TABS: 5.0000 mg | ORAL_TABLET | Freq: Three times a day (TID) | ORAL | 1 refills | Status: DC | PRN

## 2021-09-13 NOTE — Patient Instructions (Signed)
Start on Flexeril 5 mg 3 times daily as needed neck pain. Start on meloxicam 7.5 mg 1 twice daily as needed. Neck exercises.  Weight management: Started on wegovy 0.25 mg weekly.  Recommend continue to work on eating healthy diet and exercise.

## 2021-09-14 ENCOUNTER — Other Ambulatory Visit: Payer: Self-pay | Admitting: Nurse Practitioner

## 2021-09-14 ENCOUNTER — Other Ambulatory Visit: Payer: Self-pay | Admitting: Family Medicine

## 2021-09-14 LAB — CBC WITH DIFF/PLATELET
Basophils Absolute: 0.1 10*3/uL (ref 0.0–0.2)
Basos: 1 %
EOS (ABSOLUTE): 0.2 10*3/uL (ref 0.0–0.4)
Eos: 3 %
Hematocrit: 42.3 % (ref 34.0–46.6)
Hemoglobin: 14.1 g/dL (ref 11.1–15.9)
Immature Grans (Abs): 0.1 10*3/uL (ref 0.0–0.1)
Immature Granulocytes: 1 %
Lymphocytes Absolute: 2.9 10*3/uL (ref 0.7–3.1)
Lymphs: 35 %
MCH: 29.8 pg (ref 26.6–33.0)
MCHC: 33.3 g/dL (ref 31.5–35.7)
MCV: 89 fL (ref 79–97)
Monocytes Absolute: 0.7 10*3/uL (ref 0.1–0.9)
Monocytes: 8 %
Neutrophils Absolute: 4.3 10*3/uL (ref 1.4–7.0)
Neutrophils: 52 %
Platelets: 298 10*3/uL (ref 150–450)
RBC: 4.73 x10E6/uL (ref 3.77–5.28)
RDW: 12.1 % (ref 11.7–15.4)
WBC: 8.3 10*3/uL (ref 3.4–10.8)

## 2021-09-14 LAB — LIPID PANEL
Chol/HDL Ratio: 4 ratio (ref 0.0–4.4)
Cholesterol, Total: 222 mg/dL — ABNORMAL HIGH (ref 100–199)
HDL: 55 mg/dL (ref >39)
LDL Chol Calc (NIH): 145 mg/dL — ABNORMAL HIGH (ref 0–99)
Triglycerides: 126 mg/dL (ref 0–149)
VLDL Cholesterol Cal: 22 mg/dL (ref 5–40)

## 2021-09-14 LAB — COMPREHENSIVE METABOLIC PANEL
ALT: 29 IU/L (ref 0–32)
AST: 24 IU/L (ref 0–40)
Albumin/Globulin Ratio: 1.9 (ref 1.2–2.2)
Albumin: 4.3 g/dL (ref 3.8–4.9)
Alkaline Phosphatase: 98 IU/L (ref 44–121)
BUN/Creatinine Ratio: 9 (ref 9–23)
BUN: 11 mg/dL (ref 6–24)
Bilirubin Total: 0.3 mg/dL (ref 0.0–1.2)
CO2: 23 mmol/L (ref 20–29)
Calcium: 9.4 mg/dL (ref 8.7–10.2)
Chloride: 101 mmol/L (ref 96–106)
Creatinine, Ser: 1.16 mg/dL — ABNORMAL HIGH (ref 0.57–1.00)
Globulin, Total: 2.3 g/dL (ref 1.5–4.5)
Glucose: 111 mg/dL — ABNORMAL HIGH (ref 70–99)
Potassium: 4.2 mmol/L (ref 3.5–5.2)
Sodium: 138 mmol/L (ref 134–144)
Total Protein: 6.6 g/dL (ref 6.0–8.5)
eGFR: 57 mL/min/{1.73_m2} — ABNORMAL LOW (ref >59)

## 2021-09-14 LAB — HEMOGLOBIN A1C
Est. average glucose Bld gHb Est-mCnc: 128 mg/dL
Hgb A1c MFr Bld: 6.1 % — ABNORMAL HIGH (ref 4.8–5.6)

## 2021-09-14 LAB — CARDIOVASCULAR RISK ASSESSMENT

## 2021-09-15 DIAGNOSIS — E559 Vitamin D deficiency, unspecified: Secondary | ICD-10-CM | POA: Insufficient documentation

## 2021-09-15 DIAGNOSIS — M542 Cervicalgia: Secondary | ICD-10-CM | POA: Insufficient documentation

## 2021-09-15 DIAGNOSIS — R7309 Other abnormal glucose: Secondary | ICD-10-CM | POA: Insufficient documentation

## 2021-09-15 NOTE — Assessment & Plan Note (Signed)
Well controlled.  ?No changes to medicines.  ?Continue to work on eating a healthy diet and exercise.  ?Labs drawn today.  ?

## 2021-09-15 NOTE — Assessment & Plan Note (Addendum)
The current medical regimen is effective;  continue present plan and medications.  ContinueVraylar 3 mg take 1 capsule by mouth daily, Paroxetine 37.5 mg take 1 tablet daily.

## 2021-09-15 NOTE — Assessment & Plan Note (Signed)
Check labs 

## 2021-09-15 NOTE — Assessment & Plan Note (Signed)
The current medical regimen is effective;  continue present plan and medications.

## 2021-09-15 NOTE — Assessment & Plan Note (Signed)
Check vitamin D. 

## 2021-09-15 NOTE — Assessment & Plan Note (Signed)
Worsened.  Change pravastatin 40 mg nightly to Crestor 20 mg nightly.  Continue Lovaza. Continue to work on eating a healthy diet and exercise.  Labs drawn today.

## 2021-09-15 NOTE — Assessment & Plan Note (Signed)
Start on Flexeril 5 mg 3 times daily as needed neck pain. Start on meloxicam 7.5 mg 1 twice daily as needed. Neck exercises

## 2021-09-17 ENCOUNTER — Other Ambulatory Visit: Payer: Self-pay

## 2021-09-17 ENCOUNTER — Encounter: Payer: Self-pay | Admitting: Family Medicine

## 2021-09-17 MED ORDER — ROSUVASTATIN CALCIUM 20 MG PO TABS: 20.0000 mg | ORAL_TABLET | Freq: Every day | ORAL | 1 refills | Status: DC

## 2021-09-17 NOTE — Assessment & Plan Note (Addendum)
Recommend continue to work on eating healthy diet and exercise. Wegovy 0.25 mg once weekly.  Patient to call back to let us know how she is doing.

## 2021-10-02 ENCOUNTER — Other Ambulatory Visit: Payer: BC Managed Care – PPO

## 2021-10-02 DIAGNOSIS — R899 Unspecified abnormal finding in specimens from other organs, systems and tissues: Secondary | ICD-10-CM

## 2021-10-03 LAB — COMPREHENSIVE METABOLIC PANEL
ALT: 22 IU/L (ref 0–32)
AST: 24 IU/L (ref 0–40)
Albumin/Globulin Ratio: 2 (ref 1.2–2.2)
Albumin: 4.7 g/dL (ref 3.8–4.9)
Alkaline Phosphatase: 77 IU/L (ref 44–121)
BUN/Creatinine Ratio: 7 — ABNORMAL LOW (ref 9–23)
BUN: 7 mg/dL (ref 6–24)
Bilirubin Total: 0.2 mg/dL (ref 0.0–1.2)
CO2: 25 mmol/L (ref 20–29)
Calcium: 9.6 mg/dL (ref 8.7–10.2)
Chloride: 99 mmol/L (ref 96–106)
Creatinine, Ser: 1.07 mg/dL — ABNORMAL HIGH (ref 0.57–1.00)
Globulin, Total: 2.4 g/dL (ref 1.5–4.5)
Glucose: 109 mg/dL — ABNORMAL HIGH (ref 70–99)
Potassium: 3.8 mmol/L (ref 3.5–5.2)
Sodium: 140 mmol/L (ref 134–144)
Total Protein: 7.1 g/dL (ref 6.0–8.5)
eGFR: 62 mL/min/{1.73_m2} (ref >59)

## 2021-10-11 ENCOUNTER — Other Ambulatory Visit: Payer: Self-pay | Admitting: Nurse Practitioner

## 2021-10-11 DIAGNOSIS — J328 Other chronic sinusitis: Secondary | ICD-10-CM

## 2021-10-15 ENCOUNTER — Other Ambulatory Visit: Payer: Self-pay | Admitting: Family Medicine

## 2021-10-15 ENCOUNTER — Other Ambulatory Visit: Payer: Self-pay

## 2021-10-15 DIAGNOSIS — F902 Attention-deficit hyperactivity disorder, combined type: Secondary | ICD-10-CM

## 2021-10-15 MED ORDER — LISDEXAMFETAMINE DIMESYLATE 50 MG PO CAPS: 50.0000 mg | ORAL_CAPSULE | Freq: Every day | ORAL | 0 refills | Status: DC

## 2021-11-01 ENCOUNTER — Other Ambulatory Visit: Payer: Self-pay | Admitting: Family Medicine

## 2021-11-01 DIAGNOSIS — M542 Cervicalgia: Secondary | ICD-10-CM

## 2021-11-13 ENCOUNTER — Other Ambulatory Visit: Payer: Self-pay

## 2021-11-13 DIAGNOSIS — F902 Attention-deficit hyperactivity disorder, combined type: Secondary | ICD-10-CM

## 2021-11-13 MED ORDER — LISDEXAMFETAMINE DIMESYLATE 50 MG PO CAPS: 50.0000 mg | ORAL_CAPSULE | Freq: Every day | ORAL | 0 refills | Status: DC

## 2021-11-29 IMAGING — MG MM DIGITAL SCREENING BILAT W/ TOMO AND CAD
8 series · 8 of 24 positions shown · non-contrast
Comparison: Previous exam(s).

CLINICAL DATA: Screening.

EXAM:
DIGITAL SCREENING BILATERAL MAMMOGRAM WITH TOMOSYNTHESIS AND CAD
TECHNIQUE: Bilateral screening digital craniocaudal and mediolateral oblique
mammograms were obtained. Bilateral screening digital breast
tomosynthesis was performed. The images were evaluated with
computer-aided detection.

[R CC synth-2D]
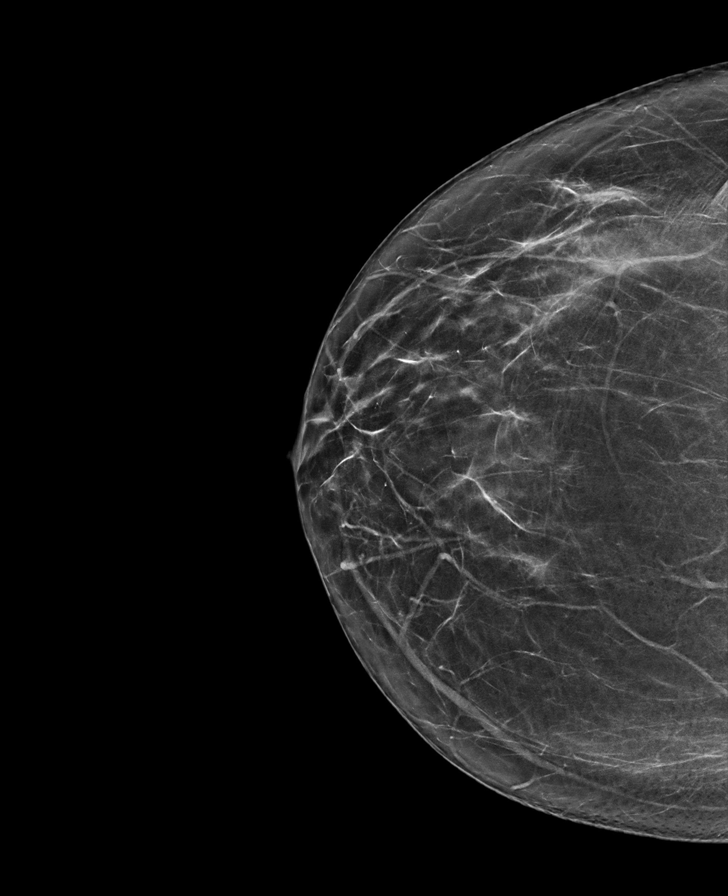

[L CC synth-2D]
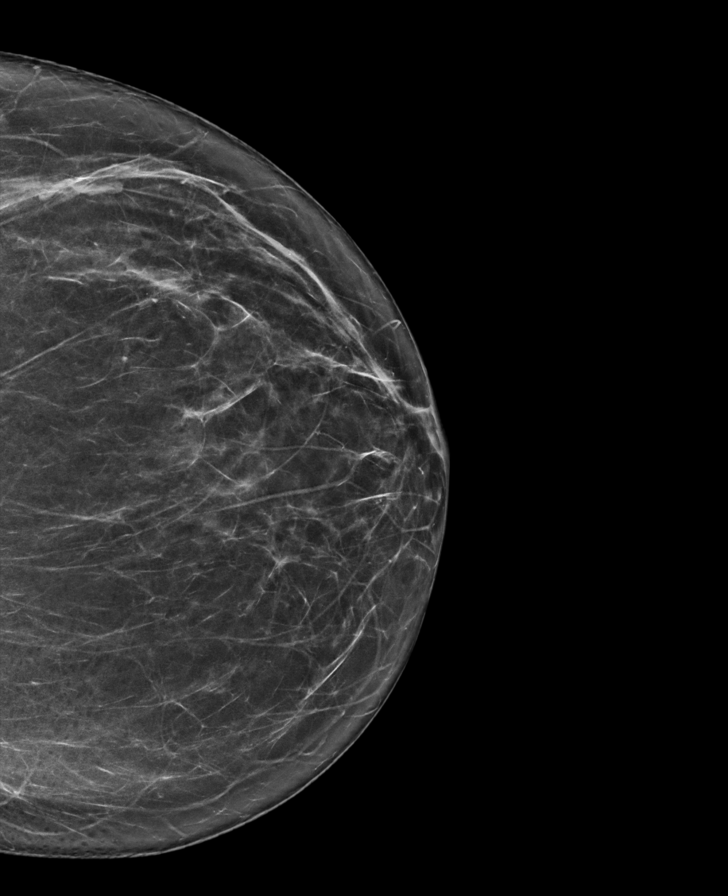

[L MLO synth-2D]
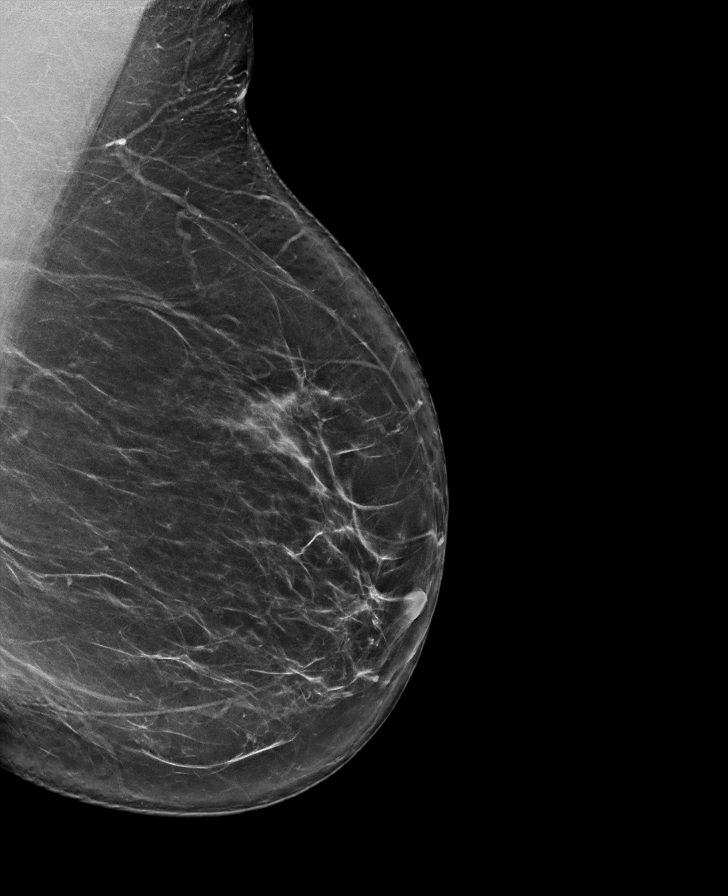

[R MLO synth-2D]
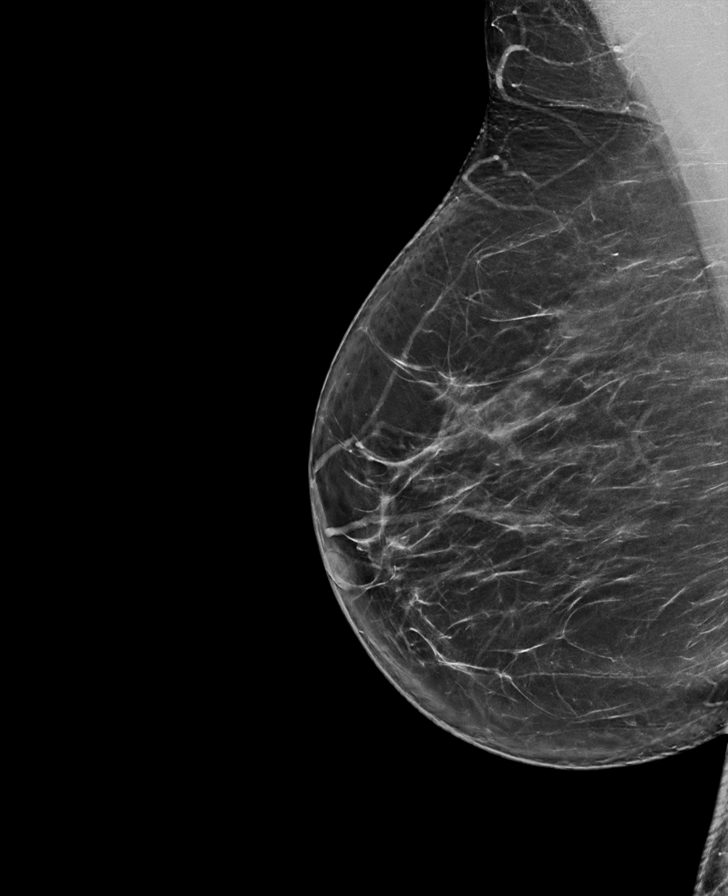

[L CC tomo · tomo slice 35/70.0]
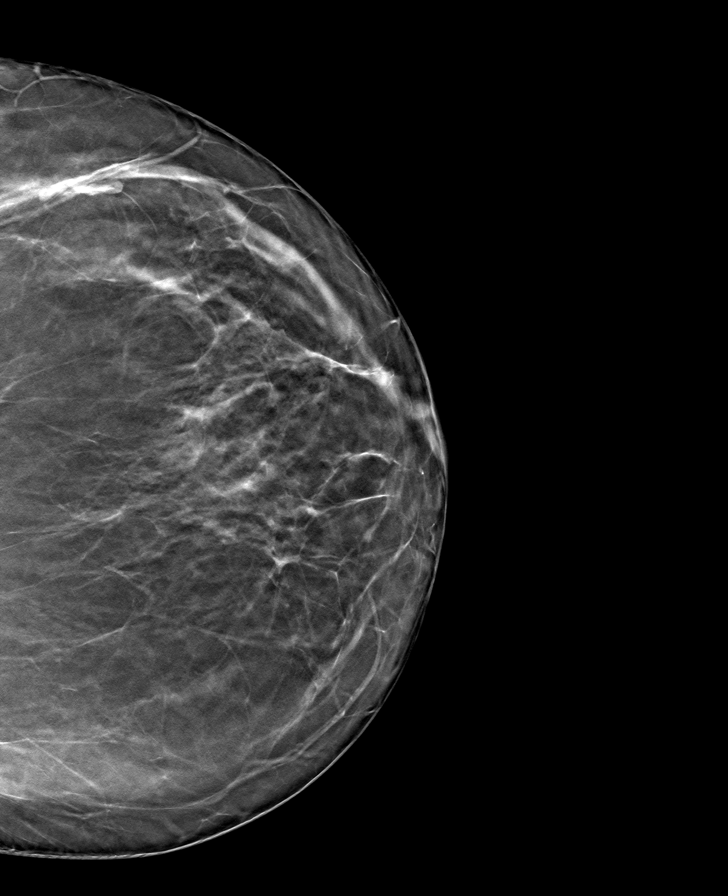

[R CC tomo · tomo slice 35/70.0]
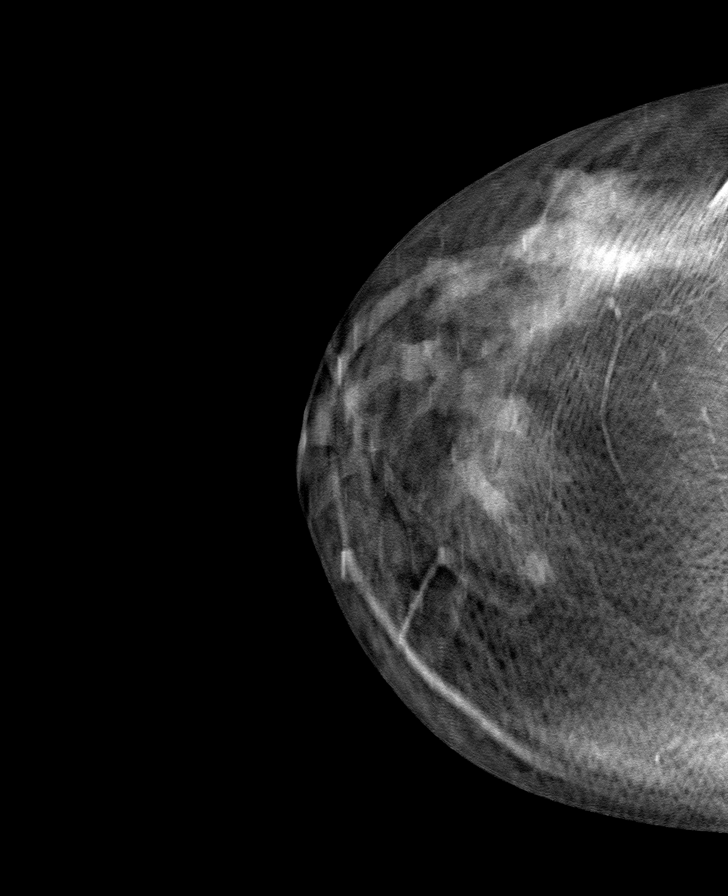

[L MLO tomo · tomo slice 44/87.0]
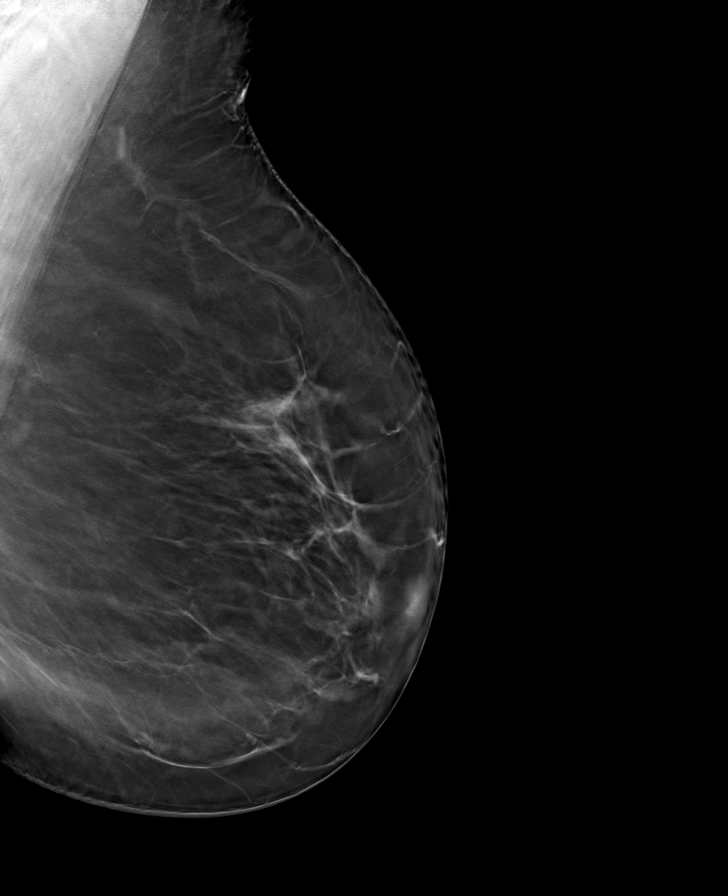

[R MLO tomo · tomo slice 41/80.0]
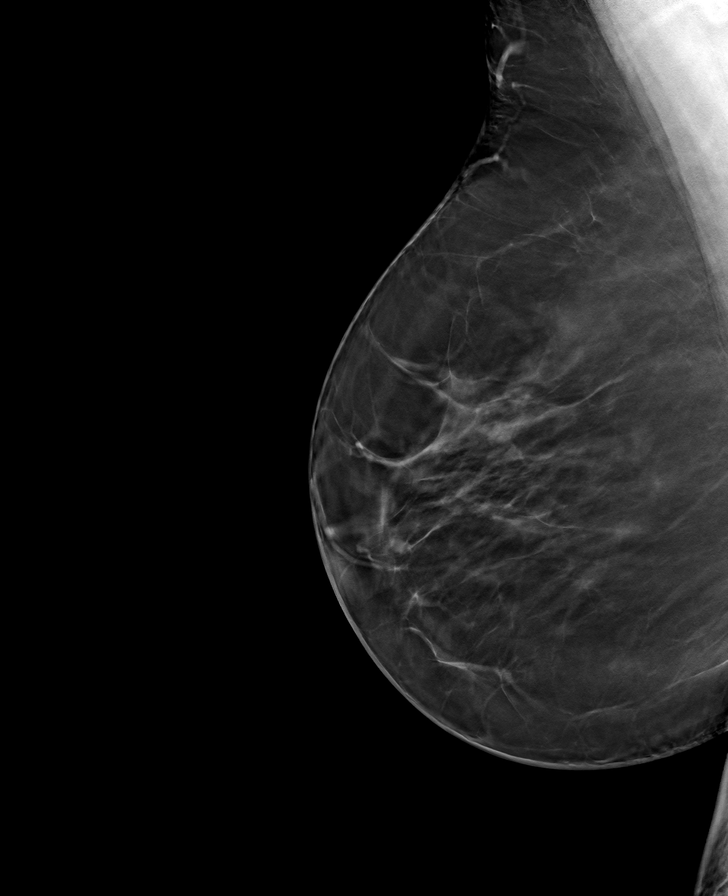

[8 of 24 positions shown; findings below may reference images not displayed]

ACR Breast Density Category b: There are scattered areas of
fibroglandular density.
FINDINGS: There are no findings suspicious for malignancy.
IMPRESSION: No mammographic evidence of malignancy. A result letter of this
screening mammogram will be mailed directly to the patient.

RECOMMENDATION:
Screening mammogram in one year. (Code:51-O-LD2)

BI-RADS CATEGORY  1: Negative.

## 2021-12-10 ENCOUNTER — Other Ambulatory Visit: Payer: Self-pay

## 2021-12-10 DIAGNOSIS — F902 Attention-deficit hyperactivity disorder, combined type: Secondary | ICD-10-CM

## 2021-12-10 MED ORDER — LISDEXAMFETAMINE DIMESYLATE 50 MG PO CAPS: 50.0000 mg | ORAL_CAPSULE | Freq: Every day | ORAL | 0 refills | Status: DC

## 2021-12-12 ENCOUNTER — Encounter: Payer: Self-pay | Admitting: Family Medicine

## 2021-12-12 ENCOUNTER — Ambulatory Visit: Payer: BC Managed Care – PPO | Admitting: Family Medicine

## 2021-12-12 ENCOUNTER — Other Ambulatory Visit: Payer: Self-pay | Admitting: Family Medicine

## 2021-12-12 VITALS — BP 144/102 | HR 103 | Temp 97.9°F | Wt 244.0 lb

## 2021-12-12 DIAGNOSIS — J028 Acute pharyngitis due to other specified organisms: Secondary | ICD-10-CM

## 2021-12-12 DIAGNOSIS — J4541 Moderate persistent asthma with (acute) exacerbation: Secondary | ICD-10-CM | POA: Diagnosis not present

## 2021-12-12 MED ORDER — TRIAMCINOLONE ACETONIDE 40 MG/ML IJ SUSP
80.0000 mg | Freq: Once | INTRAMUSCULAR | Status: AC
  Administered 2021-12-12: 80 mg via INTRAMUSCULAR

## 2021-12-12 MED ORDER — PREDNISONE 50 MG PO TABS: 50.0000 mg | ORAL_TABLET | Freq: Every day | ORAL | 0 refills | Status: DC

## 2021-12-12 MED ORDER — BENZONATATE 200 MG PO CAPS: 200.0000 mg | ORAL_CAPSULE | Freq: Three times a day (TID) | ORAL | 0 refills | Status: DC | PRN

## 2021-12-12 MED ORDER — TRIAMCINOLONE ACETONIDE 40 MG/ML IJ SUSP: 80.0000 mg | Freq: Once | INTRAMUSCULAR | Status: DC

## 2021-12-12 MED ORDER — CEFTRIAXONE SODIUM 1 G IJ SOLR
1.0000 g | Freq: Once | INTRAMUSCULAR | Status: AC
  Administered 2021-12-12: 1 g via INTRAMUSCULAR

## 2021-12-12 MED ORDER — AMOXICILLIN-POT CLAVULANATE 875-125 MG PO TABS: 1.0000 | ORAL_TABLET | Freq: Two times a day (BID) | ORAL | 0 refills | Status: DC

## 2021-12-12 NOTE — Patient Instructions (Signed)
Start on prednisone 50 mg daily x5 days. Augmentin 875 mg 1 twice daily x10 days. Use albuterol inhaler 2 puffs 4 times a day as needed. Sent Tessalon Perles for cough. Strep was negative. COVID was negative.

## 2021-12-12 NOTE — Progress Notes (Signed)
Acute Office Visit  Subjective:    Patient ID: Anna [name], female    DOB: 1970-10-16, 51 y.o.   MRN: [id number]  Chief Complaint  Patient presents with   Cough    chest    HPI: Patient is in today for Coughing, wheezing, shortness of breath, headache, nasal congestion, sore throat and lost voice. Started Saturday. Covid 19 negative. PMHX: asthma. Patient has been taking albuterol inhaler 2 puffs four times a day since Saturday. Not helping. Benadryl and advil. Help, but comes back.  No fever, chills or sweats.   Past Medical History:  Diagnosis Date   ADD (attention deficit disorder)    Arthritis    Asthma    seasonal   Bipolar affective disorder, current episode depressed ([place])    GERD (gastroesophageal reflux disease)    occ   History of recurrent UTIs 03/11/2012   Hypertension     Past Surgical History:  Procedure Laterality Date   ABDOMINAL HYSTERECTOMY  2010   endometriosis. Total hysterectomy   ANTERIOR CERVICAL DECOMP/DISCECTOMY FUSION N/A 07/13/2012   Procedure: ANTERIOR CERVICAL DECOMPRESSION/DISCECTOMY FUSION 1 LEVEL;  Surgeon: Elaina Hoops, MD;  Location: [facility];  Service: Neurosurgery;  Laterality: N/A;  Anterior Cervical Decompression/Discectomy Cervical Three-Four   CARPAL TUNNEL RELEASE Right    CERVICAL DISC SURGERY  09   CHOLECYSTECTOMY      Family History  Problem Relation Age of Onset   Breast cancer Neg Hx     Social History   Socioeconomic History   Marital status: Married    Spouse name: Not on file   Number of children: Not on file   Years of education: Not on file   Highest education level: Not on file  Occupational History   Not on file  Tobacco Use   Smoking status: Never   Smokeless tobacco: Never  Vaping Use   Vaping Use: Never used  Substance and Sexual Activity   Alcohol use: No   Drug use: No    Comment: 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   Sexual activity: Not on file  Other Topics Concern   Not on file  Social  History Narrative   Not on file   Social Determinants of Health   Financial Resource Strain: Not on file  Food Insecurity: Not on file  Transportation Needs: Not on file  Physical Activity: Not on file  Stress: Not on file  Social Connections: Not on file  Intimate Partner Violence: Not on file    Outpatient Medications Prior to Visit  Medication Sig Dispense Refill   albuterol (VENTOLIN HFA) 108 (90 Base) MCG/ACT inhaler TAKE 2 PUFFS BY MOUTH EVERY 6 HOURS AS NEEDED FOR WHEEZE OR SHORTNESS OF BREATH 18 each 1   ALPRAZolam (XANAX) 0.5 MG tablet TAKE 1 TABLET BY MOUTH DAILY AS NEEDED FOR SEVERE ANXIETY 30 tablet 2   amLODipine-olmesartan (AZOR) 5-20 MG tablet TAKE 1 TABLET BY MOUTH EVERY DAY 90 tablet 0   cyclobenzaprine (FLEXERIL) 5 MG tablet TAKE 1 TABLET BY MOUTH THREE TIMES A DAY AS NEEDED FOR MUSCLE SPASMS 60 tablet 1   fluticasone (FLONASE) 50 MCG/ACT nasal spray SPRAY 2 SPRAYS INTO EACH NOSTRIL EVERY DAY 48 mL 2   lisdexamfetamine (VYVANSE) 50 MG capsule Take 1 capsule (50 mg total) by mouth daily. 30 capsule 0   meloxicam (MOBIC) 7.5 MG tablet One twice daily as needed for neck pain. Take with food. 30 tablet 0   omega-3 acid ethyl esters (LOVAZA) 1  g capsule TAKE 1 CAPSULE BY MOUTH TWICE A DAY 180 capsule 0   PARoxetine (PAXIL-CR) 37.5 MG 24 hr tablet TAKE 1 TABLET BY MOUTH EVERY DAY 90 tablet 1   rosuvastatin (CRESTOR) 20 MG tablet Take 1 tablet (20 mg total) by mouth daily. 90 tablet 1   Semaglutide-Weight Management (WEGOVY) 0.25 MG/0.5ML SOAJ Inject 0.25 mg into the skin once a week. 2 mL 0   traZODone (DESYREL) 50 MG tablet TAKE 1 TABLET BY MOUTH EVERYDAY AT BEDTIME 90 tablet 3   triamcinolone cream (KENALOG) 0.1 % Apply 1 application. topically 2 (two) times daily. 45 g 1   valACYclovir (VALTREX) 500 MG tablet Take 1 tablet (500 mg total) by mouth 2 (two) times daily. 14 tablet 1   Vitamin D, Ergocalciferol, (DRISDOL) 1.25 MG (50000 UNIT) CAPS capsule Take 1 capsule  (50,000 Units total) by mouth 2 (two) times a week. 24 capsule 0   No facility-administered medications prior to visit.    Allergies  Allergen Reactions   Avelox [Moxifloxacin Hcl In Nacl] Anaphylaxis   Lactose Intolerance (Gi) Diarrhea   Tape Other (See Comments)    If tape on a while will pull off skin    Review of Systems  Constitutional:  Negative for chills, fatigue and fever.  HENT:  Positive for congestion and sore throat. Negative for ear pain.   Respiratory:  Positive for cough, shortness of breath and wheezing.   Cardiovascular:  Negative for chest pain.  Neurological:  Positive for headaches.       Objective:    Physical Exam Vitals reviewed.  Constitutional:      Appearance: Normal appearance. She is obese.  HENT:     Right Ear: Tympanic membrane, ear canal and external ear normal.     Left Ear: Tympanic membrane, ear canal and external ear normal.     Nose: Congestion present.     Mouth/Throat:     Pharynx: Oropharynx is clear. Posterior oropharyngeal erythema present. No oropharyngeal exudate.  Cardiovascular:     Rate and Rhythm: Normal rate and regular rhythm.     Heart sounds: Normal heart sounds. No murmur heard. Pulmonary:     Effort: Pulmonary effort is normal. No respiratory distress.     Breath sounds: Normal breath sounds.     Comments: Poor air exchange Albuterol nebulizer given.  Significantly improved breathing.   Lymphadenopathy:     Cervical: No cervical adenopathy.  Neurological:     Mental Status: She is alert and oriented to person, place, and time.  Psychiatric:        Mood and Affect: Mood normal.        Behavior: Behavior normal.     BP (!) 144/102   Pulse (!) 103   Temp 97.9 F (36.6 C)   Wt 244 lb (110.7 kg)   SpO2 98%   BMI 42.54 kg/m  Wt Readings from Last 3 Encounters:  12/12/21 244 lb (110.7 kg)  09/13/21 251 lb (113.9 kg)  07/16/21 251 lb (113.9 kg)    Health Maintenance Due  Topic Date Due   TETANUS/TDAP   Never done   PAP SMEAR-Modifier  Never done   COLONOSCOPY (Pts 45-31yr Insurance coverage will need to be confirmed)  Never done   Zoster Vaccines- Shingrix (1 of 2) Never done   COVID-19 Vaccine (5 - Pfizer series) 10/10/2020   INFLUENZA VACCINE  10/09/2021    There are no preventive care reminders to display for this patient.   Lab  Results  Component Value Date   TSH 1.000 03/29/2021   Lab Results  Component Value Date   WBC 8.3 09/13/2021   HGB 14.1 09/13/2021   HCT 42.3 09/13/2021   MCV 89 09/13/2021   PLT 298 09/13/2021   Lab Results  Component Value Date   NA 140 10/02/2021   K 3.8 10/02/2021   CO2 25 10/02/2021   GLUCOSE 109 (H) 10/02/2021   BUN 7 10/02/2021   CREATININE 1.07 (H) 10/02/2021   BILITOT 0.2 10/02/2021   ALKPHOS 77 10/02/2021   AST 24 10/02/2021   ALT 22 10/02/2021   PROT 7.1 10/02/2021   ALBUMIN 4.7 10/02/2021   CALCIUM 9.6 10/02/2021   EGFR 62 10/02/2021   Lab Results  Component Value Date   CHOL 222 (H) 09/13/2021   Lab Results  Component Value Date   HDL 55 09/13/2021   Lab Results  Component Value Date   LDLCALC 145 (H) 09/13/2021   Lab Results  Component Value Date   TRIG 126 09/13/2021   Lab Results  Component Value Date   CHOLHDL 4.0 09/13/2021   Lab Results  Component Value Date   HGBA1C 6.1 (H) 09/13/2021       Assessment & Plan:   Problem List Items Addressed This Visit       Respiratory   Moderate persistent asthma with exacerbation - Primary    Start on prednisone 50 mg daily x5 days. Use albuterol inhaler 2 puffs 4 times a day as needed. Sent Tessalon Perles for cough. COVID was negative.      Relevant Medications   predniSONE (DELTASONE) 50 MG tablet   benzonatate (TESSALON) 200 MG capsule   Acute pharyngitis due to other specified organisms    Strep negative.       Relevant Medications   amoxicillin-clavulanate (AUGMENTIN) 875-125 MG tablet   Other Relevant Orders   POC COVID-19 BinaxNow  (Completed)   POCT rapid strep A (Completed)   Meds ordered this encounter  Medications   cefTRIAXone (ROCEPHIN) injection 1 g   DISCONTD: triamcinolone acetonide (KENALOG-40) injection 80 mg   predniSONE (DELTASONE) 50 MG tablet    Sig: Take 1 tablet (50 mg total) by mouth daily with breakfast.    Dispense:  5 tablet    Refill:  0   benzonatate (TESSALON) 200 MG capsule    Sig: Take 1 capsule (200 mg total) by mouth 3 (three) times daily as needed for cough.    Dispense:  30 capsule    Refill:  0   amoxicillin-clavulanate (AUGMENTIN) 875-125 MG tablet    Sig: Take 1 tablet by mouth 2 (two) times daily.    Dispense:  20 tablet    Refill:  0   triamcinolone acetonide (KENALOG-40) injection 80 mg    Orders Placed This Encounter  Procedures   POC COVID-19 BinaxNow   POCT rapid strep A     Follow-up: No follow-ups on file.  An After Visit Summary was printed and given to the patient.  Rochel Brome, MD [facility] [phone]

## 2021-12-16 DIAGNOSIS — J4541 Moderate persistent asthma with (acute) exacerbation: Secondary | ICD-10-CM

## 2021-12-16 DIAGNOSIS — J028 Acute pharyngitis due to other specified organisms: Secondary | ICD-10-CM | POA: Insufficient documentation

## 2021-12-16 HISTORY — DX: Moderate persistent asthma with (acute) exacerbation: J45.41

## 2021-12-16 LAB — POCT RAPID STREP A (OFFICE): Rapid Strep A Screen: NEGATIVE

## 2021-12-16 LAB — POC COVID19 BINAXNOW: SARS Coronavirus 2 Ag: NEGATIVE

## 2021-12-16 NOTE — Assessment & Plan Note (Addendum)
Strep negative.

## 2021-12-16 NOTE — Assessment & Plan Note (Addendum)
Start on prednisone 50 mg daily x5 days. Use albuterol inhaler 2 puffs 4 times a day as needed. Sent Tessalon Perles for cough. COVID was negative.

## 2021-12-17 ENCOUNTER — Other Ambulatory Visit: Payer: Self-pay

## 2021-12-17 DIAGNOSIS — J4521 Mild intermittent asthma with (acute) exacerbation: Secondary | ICD-10-CM

## 2021-12-17 MED ORDER — ALBUTEROL SULFATE HFA 108 (90 BASE) MCG/ACT IN AERS: 2.0000 | INHALATION_SPRAY | Freq: Four times a day (QID) | RESPIRATORY_TRACT | 1 refills | Status: AC | PRN

## 2021-12-18 ENCOUNTER — Ambulatory Visit: Payer: BC Managed Care – PPO | Admitting: Family Medicine

## 2021-12-30 NOTE — Progress Notes (Unsigned)
Subjective:  Patient ID: Anna [name], female    DOB: 1970/09/13  Age: 51 y.o. MRN: [id number]  Chief Complaint  Patient presents with   Hypertension   Hyperlipidemia    HPI Hypertension: She is taking Amlodipine-Olmesartan  5-20 mg daily.  BP 130/70-80. Hyperlipidemia: Taking Rosuvastatin 20 mg daily, Lovaza 1 g one capsule twice a day. ADHD: She takes Vyvanse 50 mg  daily. Weight loss: Wegovy prescribed in 09/2021, however insurance has not approved it.  Bipolar disorder: She is on Paxil CR 37.5 mg daily, vraylar 3 mg once daily, and trazodone 50 mg before bed.  PTSD: Alprazolam 0.5 mg  daily PRN for severe anxiety. Vitamin D deficiency: taking vitamin D 50K twice weekly.  Still coughing. Using otc medicines. Not requiring albuterol inhaler.     12/31/2021    9:18 AM 09/17/2021    8:05 PM 03/07/2021    8:43 AM  PHQ9 SCORE ONLY  PHQ-9 Total Score 1 1 1     Current Outpatient Medications on File Prior to Visit  Medication Sig Dispense Refill   albuterol (VENTOLIN HFA) 108 (90 Base) MCG/ACT inhaler Inhale 2 puffs into the lungs every 6 (six) hours as needed for wheezing or shortness of breath. 18 g 1   ALPRAZolam (XANAX) 0.5 MG tablet TAKE 1 TABLET BY MOUTH DAILY AS NEEDED FOR SEVERE ANXIETY 30 tablet 2   amLODipine-olmesartan (AZOR) 5-20 MG tablet TAKE 1 TABLET BY MOUTH EVERY DAY 90 tablet 0   cyclobenzaprine (FLEXERIL) 5 MG tablet TAKE 1 TABLET BY MOUTH THREE TIMES A DAY AS NEEDED FOR MUSCLE SPASMS 60 tablet 1   fluticasone (FLONASE) 50 MCG/ACT nasal spray SPRAY 2 SPRAYS INTO EACH NOSTRIL EVERY DAY 48 mL 2   lisdexamfetamine (VYVANSE) 50 MG capsule Take 1 capsule (50 mg total) by mouth daily. 30 capsule 0   meloxicam (MOBIC) 7.5 MG tablet One twice daily as needed for neck pain. Take with food. 30 tablet 0   omega-3 acid ethyl esters (LOVAZA) 1 g capsule TAKE 1 CAPSULE BY MOUTH TWICE A DAY 180 capsule 0   PARoxetine (PAXIL-CR) 37.5 MG 24 hr tablet TAKE 1 TABLET BY MOUTH EVERY DAY  90 tablet 1   rosuvastatin (CRESTOR) 20 MG tablet Take 1 tablet (20 mg total) by mouth daily. 90 tablet 1   traZODone (DESYREL) 50 MG tablet TAKE 1 TABLET BY MOUTH EVERYDAY AT BEDTIME 90 tablet 3   triamcinolone cream (KENALOG) 0.1 % Apply 1 application. topically 2 (two) times daily. 45 g 1   Vitamin D, Ergocalciferol, (DRISDOL) 1.25 MG (50000 UNIT) CAPS capsule Take 1 capsule (50,000 Units total) by mouth 2 (two) times a week. 24 capsule 0   VRAYLAR 3 MG capsule Take 3 mg by mouth daily.     No current facility-administered medications on file prior to visit.   Past Medical History:  Diagnosis Date   ADD (attention deficit disorder)    Arthritis    Asthma    seasonal   Bipolar affective disorder, current episode depressed (HCC)    GERD (gastroesophageal reflux disease)    occ   History of recurrent UTIs 03/11/2012   Hypertension    Past Surgical History:  Procedure Laterality Date   ABDOMINAL HYSTERECTOMY  2010   endometriosis. Total hysterectomy   ANTERIOR CERVICAL DECOMP/DISCECTOMY FUSION N/A 07/13/2012   Procedure: ANTERIOR CERVICAL DECOMPRESSION/DISCECTOMY FUSION 1 LEVEL;  Surgeon: 09/12/2012, MD;  Location: [facility];  Service: Neurosurgery;  Laterality: N/A;  Anterior Cervical Decompression/Discectomy  Cervical Three-Four   CARPAL TUNNEL RELEASE Right    CERVICAL DISC SURGERY  09   CHOLECYSTECTOMY      Family History  Problem Relation Age of Onset   Breast cancer Neg Hx    Social History   Socioeconomic History   Marital status: Married    Spouse name: Not on file   Number of children: Not on file   Years of education: Not on file   Highest education level: Not on file  Occupational History   Not on file  Tobacco Use   Smoking status: Never   Smokeless tobacco: Never  Vaping Use   Vaping Use: Never used  Substance and Sexual Activity   Alcohol use: No   Drug use: No    Comment: 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  HENT:  Negative for congestion, ear pain, sinus pressure and sore throat.   Respiratory:  Positive for cough. Negative for chest tightness, shortness of breath and wheezing.   Cardiovascular:  Negative for chest pain and palpitations.  Gastrointestinal:  Negative for abdominal pain, constipation, diarrhea, nausea and vomiting.  Endocrine: Negative for polydipsia, polyphagia and polyuria.  Genitourinary:  Negative for dysuria and hematuria.  Musculoskeletal:  Negative for arthralgias, back pain, joint swelling and myalgias.  Skin:  Negative for rash.  Neurological:  Negative for dizziness, weakness and headaches.  Psychiatric/Behavioral:  Negative for dysphoric mood. The patient is not nervous/anxious.      Objective:  BP 132/84   Pulse 62   Temp 97.8 F (36.6 C) (Temporal)   Ht 5' 3.5" (1.613 m)   Wt 238 lb (108 kg)   SpO2 98%   BMI 41.50 kg/m      12/31/2021    9:13 AM 12/31/2021    8:44 AM 12/12/2021    3:47 PM  BP/Weight  Systolic BP 419 379 024  Diastolic BP 84 98 097  Wt. (Lbs)  238 244  BMI  41.5 kg/m2 42.54 kg/m2    Physical Exam Vitals reviewed.  Constitutional:      Appearance: Normal appearance. She is normal weight.  Neck:     Vascular: No carotid bruit.  Cardiovascular:     Rate and Rhythm: Normal rate and regular rhythm.     Heart sounds: Normal heart sounds.  Pulmonary:     Effort: Pulmonary effort is normal.     Breath sounds: Normal breath sounds.  Abdominal:     General: Abdomen is flat. Bowel sounds are normal.     Palpations: Abdomen is soft.  Neurological:      Mental Status: She is alert and oriented to person, place, and time.  Psychiatric:        Mood and Affect: Mood normal.        Behavior: Behavior normal.     Diabetic Foot Exam - Simple   No data filed      Lab Results  Component Value Date   WBC 8.3 09/13/2021   HGB 14.1 09/13/2021   HCT  42.3 09/13/2021   PLT 298 09/13/2021   GLUCOSE 109 (H) 10/02/2021   CHOL 222 (H) 09/13/2021   TRIG 126 09/13/2021   HDL 55 09/13/2021   LDLCALC 145 (H) 09/13/2021   ALT 22 10/02/2021   AST 24 10/02/2021   NA 140 10/02/2021   K 3.8 10/02/2021   CL 99 10/02/2021   CREATININE 1.07 (H) 10/02/2021   BUN 7 10/02/2021   CO2 25 10/02/2021   TSH 1.000 03/29/2021   HGBA1C 6.1 (H) 09/13/2021      Assessment & Plan:   Problem List Items Addressed This Visit       Cardiovascular and Mediastinum   Essential hypertension, benign - Primary    Well controlled.  No changes to medicines. Continue Amlodipine-Olmesartan  5-20 mg daily Continue to work on eating a healthy diet and exercise.  Labs drawn today.        Relevant Orders   Comprehensive metabolic panel   CBC with Differential/Platelet     Other   Attention deficit hyperactivity disorder (ADHD), combined type (Chronic)    Continue Vyvanse 50 mg daily      Bipolar 1 disorder, depressed, mild (HCC)    Well-controlled.   Continue Vraylar 3 mg daily, Paxil CR 37.5 mg daily and trazodone 50 mg nightly.      Morbid obesity with BMI of 40.0-44.9, adult (HCC)    Recommend patient call insurance to determine if wegovy was approved.  If so she needs to let us know may get filled at the pharmacy.      Posttraumatic stress disorder    Continue alprazolam 0.5 mg daily as needed severe anxiety      Mixed hyperlipidemia    Well controlled.  No changes to medicines.  Continue rosuvastatin 20 mg daily.  Continue Lovaza 1 g 1 capsule twice daily.  Continue to work on eating a healthy diet and exercise.  Labs drawn today.         Relevant Orders   Lipid panel   Vitamin D deficiency    Check vitamin D level.      Relevant Orders   VITAMIN D 25 Hydroxy (Vit-D Deficiency, Fractures)   Elevated glucose   Relevant Orders   Hemoglobin A1c  .  Meds ordered this encounter  Medications   valACYclovir (VALTREX) 500 MG tablet    Sig: Take 1 tablet (500 mg total) by mouth 2 (two) times daily.    Dispense:  14 tablet    Refill:  1    Orders Placed This Encounter  Procedures   Comprehensive metabolic panel   Hemoglobin A1c   Lipid panel   CBC with Differential/Platelet   VITAMIN D 25 Hydroxy (Vit-D Deficiency, Fractures)     Follow-up: Return in about 3 months (around 04/02/2022) for chronic fasting.  An After Visit Summary was printed and given to the patient.  I,Lauren M Auman,acting as a scribe for Blane Ohara, MD.,have documented all relevant documentation on the behalf of Blane Ohara, MD,as directed by  Blane Ohara, MD while in the presence of Blane Ohara, MD.    Blane Ohara, MD [facility] [phone]

## 2021-12-31 ENCOUNTER — Encounter: Payer: Self-pay | Admitting: Family Medicine

## 2021-12-31 ENCOUNTER — Ambulatory Visit: Payer: BC Managed Care – PPO | Admitting: Family Medicine

## 2021-12-31 VITALS — BP 132/84 | HR 62 | Temp 97.8°F | Ht 63.5 in | Wt 238.0 lb

## 2021-12-31 DIAGNOSIS — I10 Essential (primary) hypertension: Secondary | ICD-10-CM | POA: Diagnosis not present

## 2021-12-31 DIAGNOSIS — Z6841 Body Mass Index (BMI) 40.0 and over, adult: Secondary | ICD-10-CM

## 2021-12-31 DIAGNOSIS — E782 Mixed hyperlipidemia: Secondary | ICD-10-CM

## 2021-12-31 DIAGNOSIS — R7309 Other abnormal glucose: Secondary | ICD-10-CM

## 2021-12-31 DIAGNOSIS — E559 Vitamin D deficiency, unspecified: Secondary | ICD-10-CM

## 2021-12-31 DIAGNOSIS — F3131 Bipolar disorder, current episode depressed, mild: Secondary | ICD-10-CM

## 2021-12-31 DIAGNOSIS — F902 Attention-deficit hyperactivity disorder, combined type: Secondary | ICD-10-CM

## 2021-12-31 DIAGNOSIS — F4310 Post-traumatic stress disorder, unspecified: Secondary | ICD-10-CM

## 2021-12-31 DIAGNOSIS — K219 Gastro-esophageal reflux disease without esophagitis: Secondary | ICD-10-CM

## 2021-12-31 MED ORDER — VALACYCLOVIR HCL 500 MG PO TABS: 500.0000 mg | ORAL_TABLET | Freq: Two times a day (BID) | ORAL | 1 refills | Status: DC

## 2021-12-31 NOTE — Assessment & Plan Note (Signed)
Well controlled.  No changes to medicines. Continue Amlodipine-Olmesartan  5-20 mg daily Continue to work on eating a healthy diet and exercise.  Labs drawn today.

## 2021-12-31 NOTE — Assessment & Plan Note (Signed)
Continue Vyvanse 50 mg daily

## 2021-12-31 NOTE — Assessment & Plan Note (Signed)
Check vitamin D level 

## 2021-12-31 NOTE — Assessment & Plan Note (Signed)
Well-controlled.   Continue Vraylar 3 mg daily, Paxil CR 37.5 mg daily and trazodone 50 mg nightly.

## 2021-12-31 NOTE — Patient Instructions (Signed)
Weight management: Recommend patient call insurance to determine if wegovy was approved.  If so she needs to let us know may get filled at the pharmacy.

## 2021-12-31 NOTE — Assessment & Plan Note (Signed)
Recommend patient call insurance to determine if wegovy was approved.  If so she needs to let us know may get filled at the pharmacy.

## 2021-12-31 NOTE — Assessment & Plan Note (Signed)
Well controlled.  No changes to medicines.  Continue rosuvastatin 20 mg daily.  Continue Lovaza 1 g 1 capsule twice daily.  Continue to work on eating a healthy diet and exercise.  Labs drawn today.  

## 2021-12-31 NOTE — Assessment & Plan Note (Signed)
Continue alprazolam 0.5 mg daily as needed severe anxiety

## 2022-01-01 ENCOUNTER — Other Ambulatory Visit: Payer: Self-pay | Admitting: Family Medicine

## 2022-01-01 LAB — COMPREHENSIVE METABOLIC PANEL
ALT: 17 IU/L (ref 0–32)
AST: 18 IU/L (ref 0–40)
Albumin/Globulin Ratio: 2 (ref 1.2–2.2)
Albumin: 4.3 g/dL (ref 3.8–4.9)
Alkaline Phosphatase: 83 IU/L (ref 44–121)
BUN/Creatinine Ratio: 8 — ABNORMAL LOW (ref 9–23)
BUN: 9 mg/dL (ref 6–24)
Bilirubin Total: 0.4 mg/dL (ref 0.0–1.2)
CO2: 22 mmol/L (ref 20–29)
Calcium: 9.5 mg/dL (ref 8.7–10.2)
Chloride: 102 mmol/L (ref 96–106)
Creatinine, Ser: 1.08 mg/dL — ABNORMAL HIGH (ref 0.57–1.00)
Globulin, Total: 2.2 g/dL (ref 1.5–4.5)
Glucose: 106 mg/dL — ABNORMAL HIGH (ref 70–99)
Potassium: 4.2 mmol/L (ref 3.5–5.2)
Sodium: 142 mmol/L (ref 134–144)
Total Protein: 6.5 g/dL (ref 6.0–8.5)
eGFR: 61 mL/min/{1.73_m2} (ref >59)

## 2022-01-01 LAB — CBC WITH DIFFERENTIAL/PLATELET
Basophils Absolute: 0.1 10*3/uL (ref 0.0–0.2)
Basos: 1 %
EOS (ABSOLUTE): 0.2 10*3/uL (ref 0.0–0.4)
Eos: 2 %
Hematocrit: 39.8 % (ref 34.0–46.6)
Hemoglobin: 13.4 g/dL (ref 11.1–15.9)
Immature Grans (Abs): 0 10*3/uL (ref 0.0–0.1)
Immature Granulocytes: 0 %
Lymphocytes Absolute: 2.2 10*3/uL (ref 0.7–3.1)
Lymphs: 25 %
MCH: 29.5 pg (ref 26.6–33.0)
MCHC: 33.7 g/dL (ref 31.5–35.7)
MCV: 88 fL (ref 79–97)
Monocytes Absolute: 0.6 10*3/uL (ref 0.1–0.9)
Monocytes: 6 %
Neutrophils Absolute: 6 10*3/uL (ref 1.4–7.0)
Neutrophils: 66 %
Platelets: 288 10*3/uL (ref 150–450)
RBC: 4.54 x10E6/uL (ref 3.77–5.28)
RDW: 12.3 % (ref 11.7–15.4)
WBC: 9 10*3/uL (ref 3.4–10.8)

## 2022-01-01 LAB — HEMOGLOBIN A1C
Est. average glucose Bld gHb Est-mCnc: 131 mg/dL
Hgb A1c MFr Bld: 6.2 % — ABNORMAL HIGH (ref 4.8–5.6)

## 2022-01-01 LAB — LIPID PANEL
Chol/HDL Ratio: 2.9 ratio (ref 0.0–4.4)
Cholesterol, Total: 165 mg/dL (ref 100–199)
HDL: 57 mg/dL (ref >39)
LDL Chol Calc (NIH): 92 mg/dL (ref 0–99)
Triglycerides: 83 mg/dL (ref 0–149)
VLDL Cholesterol Cal: 16 mg/dL (ref 5–40)

## 2022-01-01 LAB — VITAMIN D 25 HYDROXY (VIT D DEFICIENCY, FRACTURES): Vit D, 25-Hydroxy: 40.3 ng/mL (ref 30.0–100.0)

## 2022-01-01 LAB — CARDIOVASCULAR RISK ASSESSMENT

## 2022-01-04 ENCOUNTER — Other Ambulatory Visit: Payer: Self-pay | Admitting: Family Medicine

## 2022-01-10 ENCOUNTER — Other Ambulatory Visit: Payer: Self-pay | Admitting: Family Medicine

## 2022-01-10 NOTE — Progress Notes (Addendum)
Acute Office Visit  Subjective:    Patient ID: Anna [name], female    DOB: [birth year], 51 y.o.   MRN: [id number]  Chief Complaint  Patient presents with   Sinusitis    HPI: Patient is in today for Sinusitis symptoms She complains of sinus congestion/pressure, cough described as productive, headache, and nasal congestion.Onset of symptoms was about a month ago and staying constant. Denies fever, chills, or rash. Treatment has included Mucinex, Claritin, and Flonase. She is drinking plenty of fluids.  Past history is significant for no history of pneumonia or bronchitis. Patient is non-smoker. Past Medical History:  Diagnosis Date   ADD (attention deficit disorder)    Arthritis    Asthma    seasonal   Bipolar affective disorder, current episode depressed ([place])    GERD (gastroesophageal reflux disease)    occ   History of recurrent UTIs 03/11/2012   Hypertension     Past Surgical History:  Procedure Laterality Date   ABDOMINAL HYSTERECTOMY  2010   endometriosis. Total hysterectomy   ANTERIOR CERVICAL DECOMP/DISCECTOMY FUSION N/A 07/13/2012   Procedure: ANTERIOR CERVICAL DECOMPRESSION/DISCECTOMY FUSION 1 LEVEL;  Surgeon: Elaina Hoops, MD;  Location: [facility];  Service: Neurosurgery;  Laterality: N/A;  Anterior Cervical Decompression/Discectomy Cervical Three-Four   CARPAL TUNNEL RELEASE Right    CERVICAL DISC SURGERY  09   CHOLECYSTECTOMY      Family History  Problem Relation Age of Onset   Breast cancer Neg Hx     Social History   Socioeconomic History   Marital status: Married    Spouse name: Not on file   Number of children: Not on file   Years of education: Not on file   Highest education level: Not on file  Occupational History   Not on file  Tobacco Use   Smoking status: Never   Smokeless tobacco: Never  Vaping Use   Vaping Use: Never used  Substance and Sexual Activity   Alcohol use: No   Drug use: No    Comment: 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 18 g 1   ALPRAZolam (XANAX) 0.5 MG tablet TAKE 1 TABLET BY MOUTH DAILY AS NEEDED FOR SEVERE ANXIETY 30 tablet 2   amLODipine-olmesartan (AZOR) 5-20 MG tablet TAKE 1 TABLET BY MOUTH EVERY DAY 90 tablet 0   cyclobenzaprine (FLEXERIL) 5 MG tablet TAKE 1 TABLET BY MOUTH THREE TIMES A DAY AS NEEDED FOR MUSCLE SPASMS 60 tablet 1   fluticasone (FLONASE) 50 MCG/ACT nasal spray SPRAY 2 SPRAYS INTO EACH NOSTRIL EVERY DAY 48 mL 2   lisdexamfetamine (VYVANSE) 50 MG capsule Take 1 capsule (50 mg total) by mouth daily. 30 capsule 0   meloxicam (MOBIC) 7.5 MG tablet One twice daily as needed for neck pain. Take with food. 30 tablet 0   omega-3  acid ethyl esters (LOVAZA) 1 g capsule TAKE 1 CAPSULE BY MOUTH TWICE A DAY 180 capsule 0   PARoxetine (PAXIL-CR) 37.5 MG 24 hr tablet TAKE 1 TABLET BY MOUTH EVERY DAY 90 tablet 1   rosuvastatin (CRESTOR) 20 MG tablet Take 1 tablet (20 mg total) by mouth daily. 90 tablet 1   traZODone (DESYREL) 50 MG tablet TAKE 1 TABLET BY MOUTH EVERYDAY AT BEDTIME 90 tablet 3   triamcinolone cream (KENALOG) 0.1 % Apply 1 application. topically 2 (two) times daily. 45 g 1   valACYclovir (VALTREX) 500 MG tablet Take 1 tablet (500 mg total) by mouth 2 (two) times daily. 14 tablet 1   Vitamin D, Ergocalciferol, (DRISDOL) 1.25 MG (50000 UNIT) CAPS capsule TAKE 1 CAPSULE (50,000 UNITS TOTAL) BY  MOUTH TWO TIMES A WEEK 24 capsule 0   VRAYLAR 3 MG capsule Take 3 mg by mouth daily.     No facility-administered medications prior to visit.    Allergies  Allergen Reactions   Avelox [Moxifloxacin Hcl In Nacl] Anaphylaxis   Lactose Intolerance (Gi) Diarrhea   Tape Other (See Comments)    If tape on a while will pull off skin    Review of Systems  Constitutional:  Positive for fatigue.  HENT:  Positive for congestion. Negative for ear pain and sore throat.   Respiratory:  Positive for cough. Negative for shortness of breath.   Cardiovascular:  Negative for chest pain.  Gastrointestinal:  Positive for nausea. Negative for abdominal pain, constipation, diarrhea and vomiting.  Genitourinary:  Negative for dysuria, frequency and urgency.  Musculoskeletal:  Negative for arthralgias, back pain and myalgias.  Neurological:  Positive for dizziness and headaches.  Psychiatric/Behavioral:  Negative for agitation and sleep disturbance. The patient is not nervous/anxious.        Objective:   BP 130/80 (BP Location: Left Arm, Cuff Size: Large)   Pulse 76   Temp (!) 97.2 F (36.2 C) (Temporal)   Wt 242 lb 3.2 oz (109.9 kg)   SpO2 99%   BMI 42.23 kg/m   Physical Exam Vitals reviewed.  Constitutional:      Appearance: She is ill-appearing.  HENT:     Right Ear: Tenderness present. Tympanic membrane is erythematous.     Left Ear: Tenderness present. Tympanic membrane is erythematous.     Nose: Congestion and rhinorrhea present.     Right Sinus: Maxillary sinus tenderness and frontal sinus tenderness present.     Left Sinus: Maxillary sinus tenderness and frontal sinus tenderness present.     Mouth/Throat:     Pharynx: Posterior oropharyngeal erythema present.  Eyes:     Pupils: Pupils are equal, round, and reactive to light.  Cardiovascular:     Rate and Rhythm: Normal rate and regular rhythm.  Pulmonary:     Effort: Pulmonary effort is normal.     Breath sounds: Rhonchi present.   Abdominal:     General: Bowel sounds are normal.  Musculoskeletal:        General: Normal range of motion.  Lymphadenopathy:     Cervical: Cervical adenopathy present.  Skin:    General: Skin is warm.     Capillary Refill: Capillary refill takes less than 2 seconds.  Neurological:     Mental Status: She is alert.  Psychiatric:        Mood and Affect: Mood normal.        Behavior: Behavior normal.    Wt Readings from Last 3 Encounters:  12/31/21 238  lb (108 kg)  12/12/21 244 lb (110.7 kg)  09/13/21 251 lb (113.9 kg)    Health Maintenance Due  Topic Date Due   TETANUS/TDAP  Never done   PAP SMEAR-Modifier  Never done   COLONOSCOPY (Pts 45-66yr Insurance coverage will need to be confirmed)  Never done   Zoster Vaccines- Shingrix (1 of 2) Never done   COVID-19 Vaccine (5 - Pfizer series) 10/10/2020   INFLUENZA VACCINE  10/09/2021       Lab Results  Component Value Date   TSH 1.000 03/29/2021   Lab Results  Component Value Date   WBC 9.0 12/31/2021   HGB 13.4 12/31/2021   HCT 39.8 12/31/2021   MCV 88 12/31/2021   PLT 288 12/31/2021   Lab Results  Component Value Date   NA 142 12/31/2021   K 4.2 12/31/2021   CO2 22 12/31/2021   GLUCOSE 106 (H) 12/31/2021   BUN 9 12/31/2021   CREATININE 1.08 (H) 12/31/2021   BILITOT 0.4 12/31/2021   ALKPHOS 83 12/31/2021   AST 18 12/31/2021   ALT 17 12/31/2021   PROT 6.5 12/31/2021   ALBUMIN 4.3 12/31/2021   CALCIUM 9.5 12/31/2021   EGFR 61 12/31/2021   Lab Results  Component Value Date   CHOL 165 12/31/2021   Lab Results  Component Value Date   HDL 57 12/31/2021   Lab Results  Component Value Date   LDLCALC 92 12/31/2021   Lab Results  Component Value Date   TRIG 83 12/31/2021   Lab Results  Component Value Date   CHOLHDL 2.9 12/31/2021   Lab Results  Component Value Date   HGBA1C 6.2 (H) 12/31/2021       Assessment & Plan:   1. Acute non-recurrent sinusitis of other sinus - ondansetron  (ZOFRAN) 4 MG tablet; Take 1 tablet (4 mg total) by mouth every 8 (eight) hours as needed for nausea or vomiting.  Dispense: 20 tablet; Refill: 0 - azithromycin (ZITHROMAX) 250 MG tablet; Take 2 tablets on day 1, then 1 tablet daily on days 2 through 5  Dispense: 6 tablet; Refill: 0 - triamcinolone acetonide (KENALOG-40) injection 60 mg  2. Acute cough - promethazine-dextromethorphan (PROMETHAZINE-DM) 6.25-15 MG/5ML syrup; Take 5 mLs by mouth 4 (four) times daily as needed.  Dispense: 118 mL; Refill: 0  3. Acute upper respiratory infection - POC COVID-19 BinaxNow-NEGATIVE   Take Z-pack as directed Use Flonase daily Kenalog 60 mg injection given in office Take Zofran as needed for nausea Rest and push fluids Follow-up as needed     Follow-up: PRN  An After Visit Summary was printed and given to the patient.  I, SRip Harbour NP, have reviewed all documentation for this visit. The documentation on 01/11/22 for the exam, diagnosis, procedures, and orders are all accurate and complete.    Signed, SRip Harbour NP [facility]([phone]

## 2022-01-11 ENCOUNTER — Other Ambulatory Visit: Payer: Self-pay | Admitting: Family Medicine

## 2022-01-11 ENCOUNTER — Encounter: Payer: Self-pay | Admitting: Nurse Practitioner

## 2022-01-11 ENCOUNTER — Other Ambulatory Visit: Payer: Self-pay | Admitting: Nurse Practitioner

## 2022-01-11 ENCOUNTER — Ambulatory Visit: Payer: BC Managed Care – PPO | Admitting: Nurse Practitioner

## 2022-01-11 VITALS — BP 130/80 | HR 76 | Temp 97.2°F | Wt 242.2 lb

## 2022-01-11 DIAGNOSIS — J018 Other acute sinusitis: Secondary | ICD-10-CM | POA: Diagnosis not present

## 2022-01-11 DIAGNOSIS — R051 Acute cough: Secondary | ICD-10-CM

## 2022-01-11 DIAGNOSIS — J069 Acute upper respiratory infection, unspecified: Secondary | ICD-10-CM

## 2022-01-11 LAB — POC COVID19 BINAXNOW: SARS Coronavirus 2 Ag: NEGATIVE

## 2022-01-11 MED ORDER — AMOXICILLIN-POT CLAVULANATE 875-125 MG PO TABS: 1.0000 | ORAL_TABLET | Freq: Two times a day (BID) | ORAL | 0 refills | Status: DC

## 2022-01-11 MED ORDER — PROMETHAZINE-DM 6.25-15 MG/5ML PO SYRP: 5.0000 mL | ORAL_SOLUTION | Freq: Four times a day (QID) | ORAL | 0 refills | Status: DC | PRN

## 2022-01-11 MED ORDER — AZITHROMYCIN 250 MG PO TABS: ORAL_TABLET | ORAL | 0 refills | Status: DC

## 2022-01-11 MED ORDER — TRIAMCINOLONE ACETONIDE 40 MG/ML IJ SUSP
60.0000 mg | Freq: Once | INTRAMUSCULAR | Status: AC
  Administered 2022-01-11: 60 mg via INTRAMUSCULAR

## 2022-01-11 MED ORDER — ONDANSETRON HCL 4 MG PO TABS: 4.0000 mg | ORAL_TABLET | Freq: Three times a day (TID) | ORAL | 0 refills | Status: DC | PRN

## 2022-01-11 NOTE — Patient Instructions (Signed)
Take Z-pack as directed Use Flonase daily Kenalog 60 mg injection given in office Take Zofran as needed for nausea Rest and push fluids Follow-up as needed    Sinus Infection, Adult A sinus infection is soreness and swelling (inflammation) of your sinuses. Sinuses are hollow spaces in the bones around your face. They are located: Around your eyes. In the middle of your forehead. Behind your nose. In your cheekbones. Your sinuses and nasal passages are lined with a fluid called mucus. Mucus drains out of your sinuses. Swelling can trap mucus in your sinuses. This lets germs (bacteria, virus, or fungus) grow, which leads to infection. Most of the time, this condition is caused by a virus. What are the causes? Allergies. Asthma. Germs. Things that block your nose or sinuses. Growths in the nose (nasal polyps). Chemicals or irritants in the air. A fungus. This is rare. What increases the risk? Having a weak body defense system (immune system). Doing a lot of swimming or diving. Using nasal sprays too much. Smoking. What are the signs or symptoms? The main symptoms of this condition are pain and a feeling of pressure around the sinuses. Other symptoms include: Stuffy nose (congestion). This may make it hard to breathe through your nose. Runny nose (drainage). Soreness, swelling, and warmth in the sinuses. A cough that may get worse at night. Being unable to smell and taste. Mucus that collects in the throat or the back of the nose (postnasal drip). This may cause a sore throat or bad breath. Being very tired (fatigued). A fever. How is this diagnosed? Your symptoms. Your medical history. A physical exam. Tests to find out if your condition is short-term (acute) or long-term (chronic). Your doctor may: Check your nose for growths (polyps). Check your sinuses using a tool that has a light on one end (endoscope). Check for allergies or germs. Do imaging tests, such as an MRI or  CT scan. How is this treated? Treatment for this condition depends on the cause and whether it is short-term or long-term. If caused by a virus, your symptoms should go away on their own within 10 days. You may be given medicines to relieve symptoms. They include: Medicines that shrink swollen tissue in the nose. A spray that treats swelling of the nostrils. Rinses that help get rid of thick mucus in your nose (nasal saline washes). Medicines that treat allergies (antihistamines). Over-the-counter pain relievers. If caused by bacteria, your doctor may wait to see if you will get better without treatment. You may be given antibiotic medicine if you have: A very bad infection. A weak body defense system. If caused by growths in the nose, surgery may be needed. Follow these instructions at home: Medicines Take, use, or apply over-the-counter and prescription medicines only as told by your doctor. These may include nasal sprays. If you were prescribed an antibiotic medicine, take it as told by your doctor. Do not stop taking it even if you start to feel better. Hydrate and humidify  Drink enough water to keep your pee (urine) pale yellow. Use a cool mist humidifier to keep the humidity level in your home above 50%. Breathe in steam for 10-15 minutes, 3-4 times a day, or as told by your doctor. You can do this in the bathroom while a hot shower is running. Try not to spend time in cool or dry air. Rest Rest as much as you can. Sleep with your head raised (elevated). Make sure you get enough sleep each night.  General instructions  Put a warm, moist washcloth on your face 3-4 times a day, or as often as told by your doctor. Use nasal saline washes as often as told by your doctor. Wash your hands often with soap and water. If you cannot use soap and water, use hand sanitizer. Do not smoke. Avoid being around people who are smoking (secondhand smoke). Keep all follow-up visits. Contact a  doctor if: You have a fever. Your symptoms get worse. Your symptoms do not get better within 10 days. Get help right away if: You have a very bad headache. You cannot stop vomiting. You have very bad pain or swelling around your face or eyes. You have trouble seeing. You feel confused. Your neck is stiff. You have trouble breathing. These symptoms may be an emergency. Get help right away. Call 911. Do not wait to see if the symptoms will go away. Do not drive yourself to the hospital. Summary A sinus infection is swelling of your sinuses. Sinuses are hollow spaces in the bones around your face. This condition is caused by tissues in your nose that become inflamed or swollen. This traps germs. These can lead to infection. If you were prescribed an antibiotic medicine, take it as told by your doctor. Do not stop taking it even if you start to feel better. Keep all follow-up visits. This information is not intended to replace advice given to you by your health care provider. Make sure you discuss any questions you have with your health care provider. Document Revised: 01/30/2021 Document Reviewed: 01/30/2021 Elsevier Patient Education  [employer].

## 2022-01-14 ENCOUNTER — Other Ambulatory Visit: Payer: Self-pay | Admitting: Family Medicine

## 2022-01-14 ENCOUNTER — Other Ambulatory Visit: Payer: Self-pay

## 2022-01-14 DIAGNOSIS — F902 Attention-deficit hyperactivity disorder, combined type: Secondary | ICD-10-CM

## 2022-01-14 MED ORDER — LISDEXAMFETAMINE DIMESYLATE 50 MG PO CAPS: 50.0000 mg | ORAL_CAPSULE | Freq: Every day | ORAL | 0 refills | Status: DC

## 2022-01-23 ENCOUNTER — Telehealth: Payer: Self-pay | Admitting: Family Medicine

## 2022-01-23 NOTE — Telephone Encounter (Signed)
Patient called wanting see doctor Buckel for depression. I told her there was no opening with Dr. Sedalia Muta but she could see a different provider. Pt stated she will only see dr. Sedalia Muta. She stated she has missed several days of work due dental issues.

## 2022-02-08 ENCOUNTER — Other Ambulatory Visit: Payer: Self-pay

## 2022-02-08 DIAGNOSIS — F902 Attention-deficit hyperactivity disorder, combined type: Secondary | ICD-10-CM

## 2022-02-09 MED ORDER — LISDEXAMFETAMINE DIMESYLATE 50 MG PO CAPS: 50.0000 mg | ORAL_CAPSULE | Freq: Every day | ORAL | 0 refills | Status: DC

## 2022-02-11 ENCOUNTER — Other Ambulatory Visit: Payer: Self-pay

## 2022-02-11 DIAGNOSIS — F902 Attention-deficit hyperactivity disorder, combined type: Secondary | ICD-10-CM

## 2022-02-11 MED ORDER — LISDEXAMFETAMINE DIMESYLATE 50 MG PO CAPS: 50.0000 mg | ORAL_CAPSULE | Freq: Every day | ORAL | 0 refills | Status: DC

## 2022-02-25 ENCOUNTER — Other Ambulatory Visit: Payer: Self-pay

## 2022-02-25 DIAGNOSIS — F902 Attention-deficit hyperactivity disorder, combined type: Secondary | ICD-10-CM

## 2022-02-25 MED ORDER — ALPRAZOLAM 0.5 MG PO TABS: ORAL_TABLET | ORAL | 0 refills | Status: DC

## 2022-02-25 NOTE — Telephone Encounter (Signed)
Too early to fill. Dr. Sedalia Muta

## 2022-03-01 ENCOUNTER — Telehealth: Payer: Self-pay

## 2022-03-01 NOTE — Telephone Encounter (Signed)
Patient mentioned if you can send something similar to vyvanse to [facility] because vyvanse is back order. Please advice

## 2022-03-06 ENCOUNTER — Other Ambulatory Visit: Payer: Self-pay | Admitting: Family Medicine

## 2022-03-06 MED ORDER — AMPHETAMINE-DEXTROAMPHET ER 20 MG PO CP24: 20.0000 mg | ORAL_CAPSULE | ORAL | 0 refills | Status: DC

## 2022-03-07 ENCOUNTER — Encounter: Payer: Self-pay | Admitting: Family Medicine

## 2022-03-07 ENCOUNTER — Ambulatory Visit: Payer: BC Managed Care – PPO | Admitting: Family Medicine

## 2022-03-07 VITALS — BP 144/75 | HR 103 | Temp 97.6°F | Ht 63.5 in | Wt 243.0 lb

## 2022-03-07 DIAGNOSIS — Z1211 Encounter for screening for malignant neoplasm of colon: Secondary | ICD-10-CM

## 2022-03-07 DIAGNOSIS — F902 Attention-deficit hyperactivity disorder, combined type: Secondary | ICD-10-CM

## 2022-03-07 DIAGNOSIS — I10 Essential (primary) hypertension: Secondary | ICD-10-CM

## 2022-03-07 DIAGNOSIS — F3131 Bipolar disorder, current episode depressed, mild: Secondary | ICD-10-CM

## 2022-03-07 MED ORDER — VRAYLAR 3 MG PO CAPS: 3.0000 mg | ORAL_CAPSULE | Freq: Every day | ORAL | 1 refills | Status: DC

## 2022-03-07 MED ORDER — AMLODIPINE-OLMESARTAN 10-40 MG PO TABS: 1.0000 | ORAL_TABLET | Freq: Every day | ORAL | 1 refills | Status: DC

## 2022-03-07 NOTE — Progress Notes (Signed)
Subjective:  Patient ID: Anna [name], female    DOB: 12/02/1970  Age: 51 y.o. MRN: [id number]  Chief Complaint  Patient presents with   Depression   BIPOLAR   Bipolar with depression: About once a month, patient become mildly dysphoric. No mania.   ADHD:  Had been on adderal until it went on back order and I had to change her to vyvanse, but vyvanse is now unavailable.      03/07/2022    3:32 PM 12/31/2021    9:18 AM 09/17/2021    8:05 PM  PHQ9 SCORE ONLY  PHQ-9 Total Score 5 1 1    HYPERTENSION: on azor 5/20 mg daily.  bp high.    Current Outpatient Medications on File Prior to Visit  Medication Sig Dispense Refill   albuterol (VENTOLIN HFA) 108 (90 Base) MCG/ACT inhaler Inhale 2 puffs into the lungs every 6 (six) hours as needed for wheezing or shortness of breath. 18 g 1   ALPRAZolam (XANAX) 0.5 MG tablet TAKE 1 TABLET BY MOUTH DAILY AS NEEDED FOR SEVERE ANXIETY 30 tablet 0   amphetamine-dextroamphetamine (ADDERALL XR) 20 MG 24 hr capsule Take 1 capsule (20 mg total) by mouth every morning. 30 capsule 0   cyclobenzaprine (FLEXERIL) 5 MG tablet TAKE 1 TABLET BY MOUTH THREE TIMES A DAY AS NEEDED FOR MUSCLE SPASMS 60 tablet 1   fluticasone (FLONASE) 50 MCG/ACT nasal spray SPRAY 2 SPRAYS INTO EACH NOSTRIL EVERY DAY 48 mL 2   omega-3 acid ethyl esters (LOVAZA) 1 g capsule TAKE 1 CAPSULE BY MOUTH TWICE A DAY 180 capsule 0   ondansetron (ZOFRAN) 4 MG tablet Take 1 tablet (4 mg total) by mouth every 8 (eight) hours as needed for nausea or vomiting. 20 tablet 0   PARoxetine (PAXIL-CR) 37.5 MG 24 hr tablet TAKE 1 TABLET BY MOUTH EVERY DAY 90 tablet 1   rosuvastatin (CRESTOR) 20 MG tablet Take 1 tablet (20 mg total) by mouth daily. 90 tablet 1   traZODone (DESYREL) 50 MG tablet TAKE 1 TABLET BY MOUTH EVERYDAY AT BEDTIME 90 tablet 3   triamcinolone cream (KENALOG) 0.1 % Apply 1 application. topically 2 (two) times daily. 45 g 1   valACYclovir (VALTREX) 500 MG tablet Take 1 tablet (500 mg  total) by mouth 2 (two) times daily. 14 tablet 1   Vitamin D, Ergocalciferol, (DRISDOL) 1.25 MG (50000 UNIT) CAPS capsule TAKE 1 CAPSULE (50,000 UNITS TOTAL) BY MOUTH TWO TIMES A WEEK 24 capsule 0   No current facility-administered medications on file prior to visit.   Past Medical History:  Diagnosis Date   ADD (attention deficit disorder)    Arthritis    Asthma    seasonal   Bipolar affective disorder, current episode depressed ([place])    GERD (gastroesophageal reflux disease)    occ   History of recurrent UTIs 03/11/2012   Hypertension    Past Surgical History:  Procedure Laterality Date   ABDOMINAL HYSTERECTOMY  2010   endometriosis. Total hysterectomy   ANTERIOR CERVICAL DECOMP/DISCECTOMY FUSION N/A 07/13/2012   Procedure: ANTERIOR CERVICAL DECOMPRESSION/DISCECTOMY FUSION 1 LEVEL;  Surgeon: Elaina Hoops, MD;  Location: [facility];  Service: Neurosurgery;  Laterality: N/A;  Anterior Cervical Decompression/Discectomy Cervical Three-Four   CARPAL TUNNEL RELEASE Right    CERVICAL DISC SURGERY  09   CHOLECYSTECTOMY      Family History  Problem Relation Age of Onset   Breast cancer Neg Hx    Social History   Socioeconomic History  Marital status: Married    Spouse name: Not on file   Number of children: Not on file   Years of education: Not on file   Highest education level: Not on file  Occupational History   Not on file  Tobacco Use   Smoking status: Never   Smokeless tobacco: Never  Vaping Use   Vaping Use: Never used  Substance and Sexual Activity   Alcohol use: No   Drug use: No    Comment: 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  HENT:  Negative for congestion, ear pain, sinus pressure and sore throat.   Respiratory:  Positive for cough. Negative for chest tightness, shortness of breath and wheezing.   Cardiovascular:  Negative for chest pain and palpitations.  Gastrointestinal:  Negative for abdominal pain, constipation, diarrhea, nausea and vomiting.  Genitourinary:  Negative for dysuria and hematuria.  Musculoskeletal:  Negative for arthralgias, back pain, joint swelling and myalgias.  Skin:  Negative for rash.  Neurological:  Negative for dizziness, weakness and headaches.  Psychiatric/Behavioral:  Positive for dysphoric mood (Patient describes her moods being up and down.). The patient is not nervous/anxious.      Objective:  BP (!) 144/75   Pulse (!) 103   Temp 97.6 F (36.4 C) (Temporal)   Ht 5' 3.5" (1.613 m)   Wt 243 lb (110.2 kg)   SpO2 99%   BMI 42.37 kg/m      03/07/2022    3:50 PM 03/07/2022    3:30 PM 01/11/2022    8:55 AM  BP/Weight  Systolic BP 144 154 130  Diastolic BP 75 74 80  Wt. (Lbs)  243   BMI  42.37 kg/m2     Physical Exam Vitals reviewed.  Constitutional:      Appearance: Normal appearance. She is obese.  Cardiovascular:     Rate and Rhythm: Normal rate and regular rhythm.     Heart sounds: Normal heart sounds.  Pulmonary:     Effort: Pulmonary effort is normal.     Breath sounds: Normal breath sounds.  Neurological:     Mental Status: She is alert and oriented to person, place, and time.  Psychiatric:        Mood and Affect: Mood normal.        Behavior: Behavior normal.     Diabetic Foot Exam - Simple   No data filed      Lab Results  Component Value Date   WBC 9.0 12/31/2021   HGB 13.4 12/31/2021   HCT 39.8 12/31/2021   PLT 288 12/31/2021   GLUCOSE 106 (H) 12/31/2021   CHOL 165 12/31/2021   TRIG 83 12/31/2021   HDL 57 12/31/2021   LDLCALC 92 12/31/2021    ALT 17 12/31/2021   AST 18 12/31/2021   NA 142 12/31/2021   K 4.2 12/31/2021   CL 102 12/31/2021   CREATININE 1.08 (H) 12/31/2021   BUN 9 12/31/2021   CO2 22 12/31/2021   TSH 1.000 03/29/2021  HGBA1C 6.2 (H) 12/31/2021      Assessment & Plan:   Problem List Items Addressed This Visit       Cardiovascular and Mediastinum   Essential hypertension, benign    Not controlled.  Increase azor to 10/40 mg daily.       Relevant Medications   amLODipine-olmesartan (AZOR) 10-40 MG tablet     Other   Attention deficit hyperactivity disorder (ADHD), combined type (Chronic)    Pharmacy ran out of vyvanse She was taking adderall in the past and she was doing well Sent Adderall XR 20 mg every morning.      Bipolar 1 disorder, depressed, mild ([place])    The current medical regimen is effective;  continue present plan and medications.  Vraylar 3 mg daily, Paxil 37.5 mg daily and Trazodone 50 mg at bedtime.      Colon cancer screening - Primary    Ordered Cologuard.      Relevant Orders   Cologuard  .  Meds ordered this encounter  Medications   VRAYLAR 3 MG capsule    Sig: Take 1 capsule (3 mg total) by mouth daily.    Dispense:  90 capsule    Refill:  1   amLODipine-olmesartan (AZOR) 10-40 MG tablet    Sig: Take 1 tablet by mouth daily.    Dispense:  90 tablet    Refill:  1    Orders Placed This Encounter  Procedures   Cologuard     Follow-up: Return in about 5 weeks (around 04/12/2022) for chronic fasting.  An After Visit Summary was printed and given to the patient.  Thompson Caul, CMA, acting as a scribe for Rochel Brome, MD.,have documented all relevant documentation on the behalf of Rochel Brome, MD,as directed by  Rochel Brome, MD while in the presence of Rochel Brome, MD.   Rochel Brome, MD [place] [phone]

## 2022-03-07 NOTE — Patient Instructions (Signed)
Increase azor to 10/40 mg daily.

## 2022-03-12 ENCOUNTER — Telehealth: Payer: Self-pay

## 2022-03-12 NOTE — Assessment & Plan Note (Addendum)
Not controlled.  Increase azor to 10/40 mg daily.

## 2022-03-12 NOTE — Assessment & Plan Note (Signed)
-   Ordered Cologuard.

## 2022-03-12 NOTE — Assessment & Plan Note (Signed)
The current medical regimen is effective;  continue present plan and medications.  Vraylar 3 mg daily, Paxil 37.5 mg daily and Trazodone 50 mg at bedtime. 

## 2022-03-12 NOTE — Assessment & Plan Note (Signed)
Pharmacy ran out of vyvanse She was taking adderall in the past and she was doing well Sent Adderall XR 20 mg every morning.

## 2022-03-12 NOTE — Telephone Encounter (Signed)
Patient needs PA for adderall. PA was cancelled. I called to [phone]. They told me they cannot help me to do PA by phone, but they re-initiated a new PA. I was able to do a new PA and we are waiting for the outcome.

## 2022-03-13 ENCOUNTER — Other Ambulatory Visit: Payer: Self-pay

## 2022-03-13 MED ORDER — AMPHETAMINE-DEXTROAMPHET ER 20 MG PO CP24: 20.0000 mg | ORAL_CAPSULE | ORAL | 0 refills | Status: DC

## 2022-03-18 ENCOUNTER — Ambulatory Visit: Payer: BC Managed Care – PPO | Admitting: Nurse Practitioner

## 2022-03-18 ENCOUNTER — Telehealth: Payer: Self-pay

## 2022-03-18 ENCOUNTER — Encounter: Payer: Self-pay | Admitting: Nurse Practitioner

## 2022-03-18 ENCOUNTER — Other Ambulatory Visit: Payer: Self-pay | Admitting: Family Medicine

## 2022-03-18 VITALS — BP 118/70 | HR 88 | Temp 97.3°F | Resp 18 | Ht 63.0 in | Wt 239.4 lb

## 2022-03-18 DIAGNOSIS — R051 Acute cough: Secondary | ICD-10-CM | POA: Diagnosis not present

## 2022-03-18 DIAGNOSIS — J320 Chronic maxillary sinusitis: Secondary | ICD-10-CM

## 2022-03-18 LAB — POC COVID19 BINAXNOW: SARS Coronavirus 2 Ag: NEGATIVE

## 2022-03-18 MED ORDER — AZITHROMYCIN 250 MG PO TABS: ORAL_TABLET | ORAL | 0 refills | Status: AC

## 2022-03-18 MED ORDER — AMPHETAMINE-DEXTROAMPHET ER 30 MG PO CP24: 30.0000 mg | ORAL_CAPSULE | ORAL | 0 refills | Status: DC

## 2022-03-18 MED ORDER — PROMETHAZINE-DM 6.25-15 MG/5ML PO SYRP: 5.0000 mL | ORAL_SOLUTION | Freq: Four times a day (QID) | ORAL | 0 refills | Status: DC | PRN

## 2022-03-18 NOTE — Assessment & Plan Note (Signed)
Take antibiotics as prescribed Use Flonase nasal spray daily Take Promethazine-DM up to 4 times daily for cough/sinus congestion Drink plenty of fluids Follow-up as needed  

## 2022-03-18 NOTE — Assessment & Plan Note (Addendum)
COVID test negative Take antibiotics as prescribed Use Flonase nasal spray daily Take Promethazine-DM up to 4 times daily for cough/sinus congestion Drink plenty of fluids Follow-up as needed

## 2022-03-18 NOTE — Telephone Encounter (Signed)
Patient is asking if she can have the Adderall 30 mg XR, because she states she is on 20 mg and was previously on 30 and wants the 30 mg.

## 2022-03-18 NOTE — Patient Instructions (Addendum)
Come back as needed  Take antibiotics as prescribed Use Flonase nasal spray daily Take Promethazine-DM up to 4 times daily for cough/sinus congestion Drink plenty of fluids   Sinus Infection, Adult A sinus infection, also called sinusitis, is inflammation of your sinuses. Sinuses are hollow spaces in the bones around your face. Your sinuses are located: Around your eyes. In the middle of your forehead. Behind your nose. In your cheekbones. Mucus normally drains out of your sinuses. When your nasal tissues become inflamed or swollen, mucus can become trapped or blocked. This allows bacteria, viruses, and fungi to grow, which leads to infection. Most infections of the sinuses are caused by a virus. A sinus infection can develop quickly. It can last for up to 4 weeks (acute) or for more than 12 weeks (chronic). A sinus infection often develops after a cold. What are the causes? This condition is caused by anything that creates swelling in the sinuses or stops mucus from draining. This includes: Allergies. Asthma. Infection from bacteria or viruses. Deformities or blockages in your nose or sinuses. Abnormal growths in the nose (nasal polyps). Pollutants, such as chemicals or irritants in the air. Infection from fungi. This is rare. What increases the risk? You are more likely to develop this condition if you: Have a weak body defense system (immune system). Do a lot of swimming or diving. Overuse nasal sprays. Smoke. What are the signs or symptoms? The main symptoms of this condition are pain and a feeling of pressure around the affected sinuses. Other symptoms include: Stuffy nose or congestion that makes it difficult to breathe through your nose. Thick yellow or greenish drainage from your nose. Tenderness, swelling, and warmth over the affected sinuses. A cough that may get worse at night. Decreased sense of smell and taste. Extra mucus that collects in the throat or the back of  the nose (postnasal drip) causing a sore throat or bad breath. Tiredness (fatigue). Fever. How is this diagnosed? This condition is diagnosed based on: Your symptoms. Your medical history. A physical exam. Tests to find out if your condition is acute or chronic. This may include: Checking your nose for nasal polyps. Viewing your sinuses using a device that has a light (endoscope). Testing for allergies or bacteria. Imaging tests, such as an MRI or CT scan. In rare cases, a bone biopsy may be done to rule out more serious types of fungal sinus disease. How is this treated? Treatment for a sinus infection depends on the cause and whether your condition is chronic or acute. If caused by a virus, your symptoms should go away on their own within 10 days. You may be given medicines to relieve symptoms. They include: Medicines that shrink swollen nasal passages (decongestants). A spray that eases inflammation of the nostrils (topical intranasal corticosteroids). Rinses that help get rid of thick mucus in your nose (nasal saline washes). Medicines that treat allergies (antihistamines). Over-the-counter pain relievers. If caused by bacteria, your health care provider may recommend waiting to see if your symptoms improve. Most bacterial infections will get better without antibiotic medicine. You may be given antibiotics if you have: A severe infection. A weak immune system. If caused by narrow nasal passages or nasal polyps, surgery may be needed. Follow these instructions at home: Medicines Take, use, or apply over-the-counter and prescription medicines only as told by your health care provider. These may include nasal sprays. If you were prescribed an antibiotic medicine, take it as told by your health care provider.  Do not stop taking the antibiotic even if you start to feel better. Hydrate and humidify  Drink enough fluid to keep your urine pale yellow. Staying hydrated will help to thin  your mucus. Use a cool mist humidifier to keep the humidity level in your home above 50%. Inhale steam for 10-15 minutes, 3-4 times a day, or as told by your health care provider. You can do this in the bathroom while a hot shower is running. Limit your exposure to cool or dry air. Rest Rest as much as possible. Sleep with your head raised (elevated). Make sure you get enough sleep each night. General instructions  Apply a warm, moist washcloth to your face 3-4 times a day or as told by your health care provider. This will help with discomfort. Use nasal saline washes as often as told by your health care provider. Wash your hands often with soap and water to reduce your exposure to germs. If soap and water are not available, use hand sanitizer. Do not smoke. Avoid being around people who are smoking (secondhand smoke). Keep all follow-up visits. This is important. Contact a health care provider if: You have a fever. Your symptoms get worse. Your symptoms do not improve within 10 days. Get help right away if: You have a severe headache. You have persistent vomiting. You have severe pain or swelling around your face or eyes. You have vision problems. You develop confusion. Your neck is stiff. You have trouble breathing. These symptoms may be an emergency. Get help right away. Call 911. Do not wait to see if the symptoms will go away. Do not drive yourself to the hospital. Summary A sinus infection is soreness and inflammation of your sinuses. Sinuses are hollow spaces in the bones around your face. This condition is caused by nasal tissues that become inflamed or swollen. The swelling traps or blocks the flow of mucus. This allows bacteria, viruses, and fungi to grow, which leads to infection. If you were prescribed an antibiotic medicine, take it as told by your health care provider. Do not stop taking the antibiotic even if you start to feel better. Keep all follow-up visits. This  is important. This information is not intended to replace advice given to you by your health care provider. Make sure you discuss any questions you have with your health care provider. Document Revised: 01/30/2021 Document Reviewed: 01/30/2021 Elsevier Patient Education  [place] as needed

## 2022-03-18 NOTE — Progress Notes (Signed)
Acute Office Visit  Subjective:    Patient ID: Anna [name], female    DOB: June 07, 1970, 52 y.o.   MRN: [id number]  Chief Complaints: cough and congestion   History of Present ilIness: Ms Lorensen presented to the clinic for the sinus issues and that started last Tuesday and states it is getting worse. She is fatigue, has postnasal drip and tenderess over her face. She is coughing constant, have some rhinorrhea. She has tried Mucinex OTC which has helped some but not much, she has a past experience with several sinus infection and she wants to be cured. She is drinking a lot. Denies wheezing, chest pain, SHORTNESS OF BREATH and distress.  Past Medical History:  Diagnosis Date   ADD (attention deficit disorder)    Arthritis    Asthma    seasonal   Bipolar affective disorder, current episode depressed (HCC)    GERD (gastroesophageal reflux disease)    occ   History of recurrent UTIs 03/11/2012   Hypertension     Past Surgical History:  Procedure Laterality Date   ABDOMINAL HYSTERECTOMY  2010   endometriosis. Total hysterectomy   ANTERIOR CERVICAL DECOMP/DISCECTOMY FUSION N/A 07/13/2012   Procedure: ANTERIOR CERVICAL DECOMPRESSION/DISCECTOMY FUSION 1 LEVEL;  Surgeon: Mariam Dollar, MD;  Location: [facility];  Service: Neurosurgery;  Laterality: N/A;  Anterior Cervical Decompression/Discectomy Cervical Three-Four   CARPAL TUNNEL RELEASE Right    CERVICAL DISC SURGERY  09   CHOLECYSTECTOMY      Family History  Problem Relation Age of Onset   Breast cancer Neg Hx     Social History   Socioeconomic History   Marital status: Married    Spouse name: Not on file   Number of children: Not on file   Years of education: Not on file   Highest education level: Not on file  Occupational History   Not on file  Tobacco Use   Smoking status: Never   Smokeless tobacco: Never  Vaping Use   Vaping Use: Never used  Substance and Sexual Activity   Alcohol use: No   Drug use: No    Comment:  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 18 g 1   ALPRAZolam (XANAX) 0.5 MG tablet TAKE 1 TABLET BY MOUTH DAILY AS NEEDED FOR SEVERE ANXIETY 30 tablet 0   amLODipine-olmesartan (AZOR) 10-40 MG tablet Take 1 tablet by mouth daily. 90 tablet 1   amphetamine-dextroamphetamine (ADDERALL XR) 20 MG 24 hr capsule Take 1 capsule (20 mg total) by mouth every morning. 30 capsule 0   cyclobenzaprine (FLEXERIL) 5 MG tablet TAKE 1 TABLET BY MOUTH THREE TIMES A DAY AS NEEDED FOR MUSCLE SPASMS 60 tablet 1   fluticasone (FLONASE) 50 MCG/ACT nasal spray SPRAY 2 SPRAYS INTO EACH NOSTRIL EVERY DAY 48 mL 2   omega-3 acid ethyl  esters (LOVAZA) 1 g capsule TAKE 1 CAPSULE BY MOUTH TWICE A DAY 180 capsule 0   ondansetron (ZOFRAN) 4 MG tablet Take 1 tablet (4 mg total) by mouth every 8 (eight) hours as needed for nausea or vomiting. 20 tablet 0   PARoxetine (PAXIL-CR) 37.5 MG 24 hr tablet TAKE 1 TABLET BY MOUTH EVERY DAY 90 tablet 1   rosuvastatin (CRESTOR) 20 MG tablet Take 1 tablet (20 mg total) by mouth daily. 90 tablet 1   traZODone (DESYREL) 50 MG tablet TAKE 1 TABLET BY MOUTH EVERYDAY AT BEDTIME 90 tablet 3   triamcinolone cream (KENALOG) 0.1 % Apply 1 application. topically 2 (two) times daily. 45 g 1   valACYclovir (VALTREX) 500 MG tablet Take 1 tablet (500 mg total) by mouth 2 (two) times daily. 14 tablet 1   Vitamin  D, Ergocalciferol, (DRISDOL) 1.25 MG (50000 UNIT) CAPS capsule TAKE 1 CAPSULE (50,000 UNITS TOTAL) BY MOUTH TWO TIMES A WEEK 24 capsule 0   VRAYLAR 3 MG capsule Take 1 capsule (3 mg total) by mouth daily. 90 capsule 1   No facility-administered medications prior to visit.    Allergies  Allergen Reactions   Avelox [Moxifloxacin Hcl In Nacl] Anaphylaxis   Lactose Intolerance (Gi) Diarrhea   Tape Other (See Comments)    If tape on a while will pull off skin    Review of Systems  Constitutional:  Positive for fatigue. Negative for chills.  HENT:  Positive for congestion, sinus pressure and sinus pain. Negative for ear pain and sore throat.   Respiratory:  Positive for cough. Negative for shortness of breath.   Cardiovascular:  Negative for chest pain and leg swelling.  Gastrointestinal:  Positive for diarrhea. Negative for abdominal pain, constipation, nausea and vomiting.  Genitourinary:  Negative for dysuria and frequency.  Musculoskeletal:  Negative for arthralgias, back pain and myalgias.  Neurological:  Positive for headaches. Negative for dizziness.      Objective:    Physical Exam Vitals reviewed.  Constitutional:      Appearance: Normal appearance. She is normal weight.  HENT:     Right Ear: Tympanic membrane normal.     Left Ear: Tympanic membrane normal.     Nose: Congestion and rhinorrhea present.     Right Turbinates: Enlarged and swollen.     Left Turbinates: Enlarged and swollen.     Right Sinus: Maxillary sinus tenderness and frontal sinus tenderness present.     Left Sinus: Maxillary sinus tenderness and frontal sinus tenderness present.     Mouth/Throat:     Pharynx: No oropharyngeal exudate or posterior oropharyngeal erythema.  Neck:     Vascular: No carotid bruit.  Cardiovascular:     Rate and Rhythm: Normal rate and regular rhythm.     Heart sounds: Normal heart sounds.  Pulmonary:     Effort: Pulmonary effort is normal.     Breath sounds: Normal breath  sounds.  Abdominal:     General: Abdomen is flat. Bowel sounds are normal.     Palpations: Abdomen is soft.     Tenderness: There is no abdominal tenderness.  Musculoskeletal:     Cervical back: Normal range of motion.  Neurological:     Mental Status: She is alert and oriented to person, place, and time.  Psychiatric:        Mood and Affect: Mood normal.        Behavior: Behavior normal.        Thought Content: Thought content  normal.        Judgment: Judgment normal.    BP 118/70   Pulse 88   Temp (!) 97.3 F (36.3 C)   Resp 18   Ht 5\' 3"  (1.6 m)   Wt 239 lb 6.4 oz (108.6 kg)   SpO2 98%   BMI 42.41 kg/m  Wt Readings from Last 3 Encounters:  03/18/22 239 lb 6.4 oz (108.6 kg)  03/07/22 243 lb (110.2 kg)  01/11/22 242 lb 3.2 oz (109.9 kg)    Health Maintenance Due  Topic Date Due   DTaP/Tdap/Td (1 - Tdap) Never done   COLONOSCOPY (Pts 45-22yrs Insurance coverage will need to be confirmed)  Never done   Zoster Vaccines- Shingrix (1 of 2) Never done   INFLUENZA VACCINE  10/09/2021   COVID-19 Vaccine (5 - 2023-24 season) 11/09/2021    There are no preventive care reminders to display for this patient.   Lab Results  Component Value Date   TSH 1.000 03/29/2021   Lab Results  Component Value Date   WBC 9.0 12/31/2021   HGB 13.4 12/31/2021   HCT 39.8 12/31/2021   MCV 88 12/31/2021   PLT 288 12/31/2021   Lab Results  Component Value Date   NA 142 12/31/2021   K 4.2 12/31/2021   CO2 22 12/31/2021   GLUCOSE 106 (H) 12/31/2021   BUN 9 12/31/2021   CREATININE 1.08 (H) 12/31/2021   BILITOT 0.4 12/31/2021   ALKPHOS 83 12/31/2021   AST 18 12/31/2021   ALT 17 12/31/2021   PROT 6.5 12/31/2021   ALBUMIN 4.3 12/31/2021   CALCIUM 9.5 12/31/2021   EGFR 61 12/31/2021   Lab Results  Component Value Date   CHOL 165 12/31/2021   Lab Results  Component Value Date   HDL 57 12/31/2021   Lab Results  Component Value Date   LDLCALC 92 12/31/2021   Lab Results   Component Value Date   TRIG 83 12/31/2021   Lab Results  Component Value Date   CHOLHDL 2.9 12/31/2021   Lab Results  Component Value Date   HGBA1C 6.2 (H) 12/31/2021       Assessment & Plan:   Problem List Items Addressed This Visit       Respiratory   Maxillary sinusitis - Primary    Take antibiotics as prescribed Use Flonase nasal spray daily Take Promethazine-DM up to 4 times daily for cough/sinus congestion Drink plenty of fluids Follow-up as needed       Relevant Medications   azithromycin (ZITHROMAX) 250 MG tablet   promethazine-dextromethorphan (PROMETHAZINE-DM) 6.25-15 MG/5ML syrup     Other   Acute cough    Take antibiotics as prescribed Use Flonase nasal spray daily Take Promethazine-DM up to 4 times daily for cough/sinus congestion Drink plenty of fluids Follow-up as needed       Relevant Orders   POC COVID-19 BinaxNow (Completed)     Follow-up:  as needed if symptoms fail to improve  Work letter provided to the paitent   I, 05-13-1992 have reviewed all documentation for this visit. The documentation on 03/18/22   for the exam, diagnosis, procedures, and orders are all accurate and complete.      An After Visit Summary was printed and given to the patient.  05/17/22, DNP, FNP [facility] [phone]

## 2022-03-20 ENCOUNTER — Telehealth: Payer: Self-pay

## 2022-03-21 ENCOUNTER — Other Ambulatory Visit: Payer: Self-pay

## 2022-03-21 ENCOUNTER — Telehealth: Payer: Self-pay

## 2022-03-21 ENCOUNTER — Ambulatory Visit: Payer: BC Managed Care – PPO | Admitting: Nurse Practitioner

## 2022-03-21 ENCOUNTER — Encounter: Payer: Self-pay | Admitting: Nurse Practitioner

## 2022-03-21 VITALS — BP 148/80 | HR 115 | Temp 97.6°F | Resp 18 | Ht 63.0 in | Wt 242.0 lb

## 2022-03-21 DIAGNOSIS — J0100 Acute maxillary sinusitis, unspecified: Secondary | ICD-10-CM

## 2022-03-21 DIAGNOSIS — R0981 Nasal congestion: Secondary | ICD-10-CM | POA: Diagnosis not present

## 2022-03-21 MED ORDER — AMOXICILLIN-POT CLAVULANATE 875-125 MG PO TABS: 1.0000 | ORAL_TABLET | Freq: Two times a day (BID) | ORAL | 0 refills | Status: DC

## 2022-03-21 MED ORDER — TRIAMCINOLONE ACETONIDE 40 MG/ML IJ SUSP
40.0000 mg | Freq: Once | INTRAMUSCULAR | Status: AC
  Administered 2022-03-21: 40 mg via INTRAMUSCULAR

## 2022-03-21 NOTE — Patient Instructions (Addendum)
Take antibiotics as prescribed Use Flonase nasal spray daily Take Promethazine-DM up to 4 times daily for cough/sinus congestion Drink plenty of fluids Follow-up as needed    Sinus Infection, Adult A sinus infection is soreness and swelling (inflammation) of your sinuses. Sinuses are hollow spaces in the bones around your face. They are located: Around your eyes. In the middle of your forehead. Behind your nose. In your cheekbones. Your sinuses and nasal passages are lined with a fluid called mucus. Mucus drains out of your sinuses. Swelling can trap mucus in your sinuses. This lets germs (bacteria, virus, or fungus) grow, which leads to infection. Most of the time, this condition is caused by a virus. What are the causes? Allergies. Asthma. Germs. Things that block your nose or sinuses. Growths in the nose (nasal polyps). Chemicals or irritants in the air. A fungus. This is rare. What increases the risk? Having a weak body defense system (immune system). Doing a lot of swimming or diving. Using nasal sprays too much. Smoking. What are the signs or symptoms? The main symptoms of this condition are pain and a feeling of pressure around the sinuses. Other symptoms include: Stuffy nose (congestion). This may make it hard to breathe through your nose. Runny nose (drainage). Soreness, swelling, and warmth in the sinuses. A cough that may get worse at night. Being unable to smell and taste. Mucus that collects in the throat or the back of the nose (postnasal drip). This may cause a sore throat or bad breath. Being very tired (fatigued). A fever. How is this diagnosed? Your symptoms. Your medical history. A physical exam. Tests to find out if your condition is short-term (acute) or long-term (chronic). Your doctor may: Check your nose for growths (polyps). Check your sinuses using a tool that has a light on one end (endoscope). Check for allergies or germs. Do imaging tests,  such as an MRI or CT scan. How is this treated? Treatment for this condition depends on the cause and whether it is short-term or long-term. If caused by a virus, your symptoms should go away on their own within 10 days. You may be given medicines to relieve symptoms. They include: Medicines that shrink swollen tissue in the nose. A spray that treats swelling of the nostrils. Rinses that help get rid of thick mucus in your nose (nasal saline washes). Medicines that treat allergies (antihistamines). Over-the-counter pain relievers. If caused by bacteria, your doctor may wait to see if you will get better without treatment. You may be given antibiotic medicine if you have: A very bad infection. A weak body defense system. If caused by growths in the nose, surgery may be needed. Follow these instructions at home: Medicines Take, use, or apply over-the-counter and prescription medicines only as told by your doctor. These may include nasal sprays. If you were prescribed an antibiotic medicine, take it as told by your doctor. Do not stop taking it even if you start to feel better. Hydrate and humidify  Drink enough water to keep your pee (urine) pale yellow. Use a cool mist humidifier to keep the humidity level in your home above 50%. Breathe in steam for 10-15 minutes, 3-4 times a day, or as told by your doctor. You can do this in the bathroom while a hot shower is running. Try not to spend time in cool or dry air. Rest Rest as much as you can. Sleep with your head raised (elevated). Make sure you get enough sleep each night. General   instructions  Put a warm, moist washcloth on your face 3-4 times a day, or as often as told by your doctor. Use nasal saline washes as often as told by your doctor. Wash your hands often with soap and water. If you cannot use soap and water, use hand sanitizer. Do not smoke. Avoid being around people who are smoking (secondhand smoke). Keep all follow-up  visits. Contact a doctor if: You have a fever. Your symptoms get worse. Your symptoms do not get better within 10 days. Get help right away if: You have a very bad headache. You cannot stop vomiting. You have very bad pain or swelling around your face or eyes. You have trouble seeing. You feel confused. Your neck is stiff. You have trouble breathing. These symptoms may be an emergency. Get help right away. Call 911. Do not wait to see if the symptoms will go away. Do not drive yourself to the hospital. Summary A sinus infection is swelling of your sinuses. Sinuses are hollow spaces in the bones around your face. This condition is caused by tissues in your nose that become inflamed or swollen. This traps germs. These can lead to infection. If you were prescribed an antibiotic medicine, take it as told by your doctor. Do not stop taking it even if you start to feel better. Keep all follow-up visits. This information is not intended to replace advice given to you by your health care provider. Make sure you discuss any questions you have with your health care provider. Document Revised: 01/30/2021 Document Reviewed: 01/30/2021 Elsevier Patient Education  2023 Elsevier Inc.  

## 2022-03-21 NOTE — Telephone Encounter (Signed)
Pharmacy: [facility] Pt called this morning stating that she not feeling any better. Pt denies getting worse or getting better.  Denies any new sxs. Does this pt need to come back in for an appt or can something else be sent in for her? Pt is requesting a Doctor's note as well. Pt has not been at work at all this week.

## 2022-03-21 NOTE — Telephone Encounter (Signed)
Patient will come this afternoon at 2:20 PM

## 2022-03-21 NOTE — Progress Notes (Addendum)
Acute Office Visit  Subjective:    Patient ID: Anna [name], female    DOB: 06/23/1970, 52 y.o.   MRN: [id number]  Chief Complaints: sinus not improving and feeling more congested  History of Present ilIness: Patient said she is not improving at all and Zpack not helping her at all to get better. She is more congested and sinus pain, pressure continue. Complaints of headache and some fever. Denies sore throat, chills and malaise. Past Medical History:  Diagnosis Date   ADD (attention deficit disorder)    Arthritis    Asthma    seasonal   Bipolar affective disorder, current episode depressed ([facility])    GERD (gastroesophageal reflux disease)    occ   History of recurrent UTIs 03/11/2012   Hypertension     Past Surgical History:  Procedure Laterality Date   ABDOMINAL HYSTERECTOMY  2010   endometriosis. Total hysterectomy   ANTERIOR CERVICAL DECOMP/DISCECTOMY FUSION N/A 07/13/2012   Procedure: ANTERIOR CERVICAL DECOMPRESSION/DISCECTOMY FUSION 1 LEVEL;  Surgeon: Elaina Hoops, MD;  Location: [facility];  Service: Neurosurgery;  Laterality: N/A;  Anterior Cervical Decompression/Discectomy Cervical Three-Four   CARPAL TUNNEL RELEASE Right    CERVICAL DISC SURGERY  09   CHOLECYSTECTOMY      Family History  Problem Relation Age of Onset   Breast cancer Neg Hx     Social History   Socioeconomic History   Marital status: Married    Spouse name: Not on file   Number of children: Not on file   Years of education: Not on file   Highest education level: Not on file  Occupational History   Not on file  Tobacco Use   Smoking status: Never   Smokeless tobacco: Never  Vaping Use   Vaping Use: Never used  Substance and Sexual Activity   Alcohol use: No   Drug use: No    Comment: 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 18 g 1   ALPRAZolam (XANAX) 0.5 MG tablet TAKE 1 TABLET BY MOUTH DAILY AS NEEDED FOR SEVERE ANXIETY 30 tablet 0   amLODipine-olmesartan (AZOR) 10-40 MG tablet Take 1 tablet by mouth daily. 90 tablet 1   amphetamine-dextroamphetamine (ADDERALL XR) 30 MG 24 hr capsule Take 1 capsule (30 mg total) by mouth every morning. 30 capsule 0   azithromycin (ZITHROMAX) 250 MG tablet Take 2 tablets on day 1, then 1 tablet daily on days 2 through 5 6 tablet 0   cyclobenzaprine (FLEXERIL) 5 MG tablet TAKE 1 TABLET BY MOUTH THREE TIMES A DAY AS NEEDED FOR MUSCLE SPASMS 60 tablet 1   fluticasone (FLONASE) 50 MCG/ACT nasal spray SPRAY 2 SPRAYS INTO EACH NOSTRIL EVERY DAY 48 mL 2   omega-3 acid ethyl esters (LOVAZA) 1 g capsule TAKE 1 CAPSULE BY MOUTH TWICE A DAY 180  capsule 0   ondansetron (ZOFRAN) 4 MG tablet Take 1 tablet (4 mg total) by mouth every 8 (eight) hours as needed for nausea or vomiting. 20 tablet 0   PARoxetine (PAXIL-CR) 37.5 MG 24 hr tablet TAKE 1 TABLET BY MOUTH EVERY DAY 90 tablet 1   promethazine-dextromethorphan (PROMETHAZINE-DM) 6.25-15 MG/5ML syrup Take 5 mLs by mouth 4 (four) times daily as needed. 118 mL 0   rosuvastatin (CRESTOR) 20 MG tablet Take 1 tablet (20 mg total) by mouth daily. 90 tablet 1   traZODone (DESYREL) 50 MG tablet TAKE 1 TABLET BY MOUTH EVERYDAY AT BEDTIME 90 tablet 3   triamcinolone cream (KENALOG) 0.1 % Apply 1 application. topically 2 (two) times daily. 45 g 1   valACYclovir (VALTREX) 500 MG tablet Take 1 tablet (500 mg total) by  mouth 2 (two) times daily. 14 tablet 1   Vitamin D, Ergocalciferol, (DRISDOL) 1.25 MG (50000 UNIT) CAPS capsule TAKE 1 CAPSULE (50,000 UNITS TOTAL) BY MOUTH TWO TIMES A WEEK 24 capsule 0   VRAYLAR 3 MG capsule Take 1 capsule (3 mg total) by mouth daily. 90 capsule 1   No facility-administered medications prior to visit.    Allergies  Allergen Reactions   Avelox [Moxifloxacin Hcl In Nacl] Anaphylaxis   Lactose Intolerance (Gi) Diarrhea   Tape Other (See Comments)    If tape on a while will pull off skin    Review of Systems  Constitutional:  Positive for fatigue. Negative for chills.  HENT:  Positive for congestion, postnasal drip, sinus pressure, sinus pain and sneezing. Negative for ear pain and sore throat.   Respiratory:  Negative for cough and shortness of breath.   Cardiovascular:  Negative for chest pain and leg swelling.  Gastrointestinal:  Positive for diarrhea. Negative for abdominal pain, constipation, nausea and vomiting.  Genitourinary:  Negative for dysuria and frequency.  Musculoskeletal:  Negative for arthralgias, back pain and myalgias.  Neurological:  Positive for headaches. Negative for dizziness.       Objective:    Physical Exam Vitals reviewed.  Constitutional:      Appearance: Normal appearance. She is normal weight.  HENT:     Nose:     Right Turbinates: Enlarged and swollen.     Left Turbinates: Enlarged and swollen.     Right Sinus: Maxillary sinus tenderness and frontal sinus tenderness present.     Left Sinus: Maxillary sinus tenderness and frontal sinus tenderness present.  Neck:     Vascular: No carotid bruit.  Cardiovascular:     Rate and Rhythm: Normal rate and regular rhythm.     Heart sounds: Normal heart sounds.  Pulmonary:     Effort: Pulmonary effort is normal.     Breath sounds: Normal breath sounds.  Abdominal:     General: Abdomen is flat. Bowel sounds are normal.     Palpations: Abdomen is soft.     Tenderness: There is no  abdominal tenderness.  Musculoskeletal:     Cervical back: Normal range of motion.  Neurological:     Mental Status: She is alert and oriented to person, place, and time.  Psychiatric:        Mood and Affect: Mood normal.        Behavior: Behavior normal.     BP (!) 140/80   Pulse (!) 115   Temp 97.6 F (36.4 C)   Resp 18   Ht 5\' 3"  (1.6 m)   Wt 242 lb (109.8 kg)   SpO2  96%   BMI 42.87 kg/m  Wt Readings from Last 3 Encounters:  03/21/22 242 lb (109.8 kg)  03/18/22 239 lb 6.4 oz (108.6 kg)  03/07/22 243 lb (110.2 kg)    Health Maintenance Due  Topic Date Due   DTaP/Tdap/Td (1 - Tdap) Never done   COLONOSCOPY (Pts 45-65yrs Insurance coverage will need to be confirmed)  Never done   Zoster Vaccines- Shingrix (1 of 2) Never done   INFLUENZA VACCINE  10/09/2021   COVID-19 Vaccine (5 - 2023-24 season) 11/09/2021    There are no preventive care reminders to display for this patient.   Lab Results  Component Value Date   TSH 1.000 03/29/2021   Lab Results  Component Value Date   WBC 9.0 12/31/2021   HGB 13.4 12/31/2021   HCT 39.8 12/31/2021   MCV 88 12/31/2021   PLT 288 12/31/2021   Lab Results  Component Value Date   NA 142 12/31/2021   K 4.2 12/31/2021   CO2 22 12/31/2021   GLUCOSE 106 (H) 12/31/2021   BUN 9 12/31/2021   CREATININE 1.08 (H) 12/31/2021   BILITOT 0.4 12/31/2021   ALKPHOS 83 12/31/2021   AST 18 12/31/2021   ALT 17 12/31/2021   PROT 6.5 12/31/2021   ALBUMIN 4.3 12/31/2021   CALCIUM 9.5 12/31/2021   EGFR 61 12/31/2021   Lab Results  Component Value Date   CHOL 165 12/31/2021   Lab Results  Component Value Date   HDL 57 12/31/2021   Lab Results  Component Value Date   LDLCALC 92 12/31/2021   Lab Results  Component Value Date   TRIG 83 12/31/2021   Lab Results  Component Value Date   CHOLHDL 2.9 12/31/2021   Lab Results  Component Value Date   HGBA1C 6.2 (H) 12/31/2021      Assessment & Plan:   Problem List Items  Addressed This Visit       Respiratory   Maxillary sinusitis - Primary    Take new antibiotics as prescribed Use Flonase nasal spray daily Take Promethazine-DM up to 4 times daily for cough/sinus congestion Drink plenty of fluids Follow-up as needed       Relevant Medications   triamcinolone acetonide (KENALOG-40) injection 40 mg   amoxicillin-clavulanate (AUGMENTIN) 875-125 MG tablet   Congestion of paranasal sinus   Relevant Medications   triamcinolone acetonide (KENALOG-40) injection 40 mg   amoxicillin-clavulanate (AUGMENTIN) 875-125 MG tablet    Follow-up: as needed if symptoms fails to improve   I, Jkayla Spiewak have reviewed all documentation for this visit. The documentation on 03/21/22   for the exam, diagnosis, procedures, and orders are all accurate and complete.     An After Visit Summary was printed and given to the patient.  Neil Crouch, DNP, [place] [phone]

## 2022-03-21 NOTE — Assessment & Plan Note (Signed)
Take new antibiotics as prescribed Use Flonase nasal spray daily Take Promethazine-DM up to 4 times daily for cough/sinus congestion Drink plenty of fluids Follow-up as needed

## 2022-03-25 ENCOUNTER — Other Ambulatory Visit: Payer: Self-pay | Admitting: Family Medicine

## 2022-04-05 ENCOUNTER — Other Ambulatory Visit: Payer: Self-pay | Admitting: Family Medicine

## 2022-04-11 NOTE — Progress Notes (Unsigned)
Subjective:  Patient ID: Anna [name], female    DOB: [birth year]  Age: 52 y.o. MRN: [id number]  Chief Complaint  Patient presents with   Hyperlipidemia   Hypertension    HPI: Hypertension: She is taking Amlodipine-Olmesartan  5-20 mg daily.  BP 130/70-80. Hyperlipidemia: Taking Rosuvastatin 20 mg daily, Lovaza 1 g one capsule twice a day. ADHD: She takes Adderal xr 30 once daily. Weight loss: Wegovy prescribed in 09/2021, however she could not find it at the pharmacy Bipolar disorder: She is on Paxil CR 37.5 mg daily, vraylar 3 mg once daily, and trazodone 50 mg before bed.  PTSD: Alprazolam 0.5 mg  daily PRN for severe anxiety. Vitamin D deficiency: taking vitamin D 50K twice weekly.  Prediabetes: A1C 6.2 on 12/31/2021     03/07/2022    3:32 PM 12/31/2021    9:18 AM 09/17/2021    8:05 PM 03/07/2021    8:43 AM  Depression screen PHQ 2/9  Decreased Interest 1 0 0 0  Down, Depressed, Hopeless 1 0 0 0  PHQ - 2 Score 2 0 0 0  Altered sleeping 1 0 0 0  Tired, decreased energy 1 1 1 1   Change in appetite 1 0 0 0  Feeling bad or failure about yourself  0 0 0 0  Trouble concentrating 0 0 0 0  Moving slowly or fidgety/restless 0 0 0 0  Suicidal thoughts 0 0 0 0  PHQ-9 Score 5 1 1 1   Difficult doing work/chores Very difficult Not difficult at all Not difficult at all Not difficult at all         08/15/2020    8:08 AM  Fall Risk  Falls in the past year? 0  Was there an injury with Fall? 0  Fall Risk Category Calculator 0  Fall Risk Category (Retired) Low  (RETIRED) Patient Fall Risk Level Low fall risk  Patient at Risk for Falls Due to No Fall Risks  Fall risk Follow up Falls evaluation completed      Review of Systems  Constitutional:  Negative for chills, fatigue and fever.  HENT:  Negative for congestion, ear pain, rhinorrhea and sore throat.   Respiratory:  Negative for cough and shortness of breath.   Cardiovascular:  Negative for chest pain.  Gastrointestinal:   Negative for abdominal pain, constipation, diarrhea, nausea and vomiting.  Genitourinary:  Negative for dysuria and urgency.  Musculoskeletal:  Negative for back pain and myalgias.  Neurological:  Negative for dizziness, weakness, light-headedness and headaches.  Psychiatric/Behavioral:  Negative for dysphoric mood. The patient is not nervous/anxious.     Current Outpatient Medications on File Prior to Visit  Medication Sig Dispense Refill   albuterol (VENTOLIN HFA) 108 (90 Base) MCG/ACT inhaler Inhale 2 puffs into the lungs every 6 (six) hours as needed for wheezing or shortness of breath. 18 g 1   amLODipine-olmesartan (AZOR) 10-40 MG tablet Take 1 tablet by mouth daily. 90 tablet 1   amoxicillin-clavulanate (AUGMENTIN) 875-125 MG tablet Take 1 tablet by mouth 2 (two) times daily. 20 tablet 0   amphetamine-dextroamphetamine (ADDERALL XR) 30 MG 24 hr capsule Take 1 capsule (30 mg total) by mouth every morning. 30 capsule 0   cyclobenzaprine (FLEXERIL) 5 MG tablet TAKE 1 TABLET BY MOUTH THREE TIMES A DAY AS NEEDED FOR MUSCLE SPASMS 60 tablet 1   fluticasone (FLONASE) 50 MCG/ACT nasal spray SPRAY 2 SPRAYS INTO EACH NOSTRIL EVERY DAY 48 mL 2   omega-3 acid ethyl esters (  LOVAZA) 1 g capsule TAKE 1 CAPSULE BY MOUTH TWICE A DAY 180 capsule 0   ondansetron (ZOFRAN) 4 MG tablet Take 1 tablet (4 mg total) by mouth every 8 (eight) hours as needed for nausea or vomiting. 20 tablet 0   PARoxetine (PAXIL-CR) 37.5 MG 24 hr tablet TAKE 1 TABLET BY MOUTH EVERY DAY 90 tablet 1   promethazine-dextromethorphan (PROMETHAZINE-DM) 6.25-15 MG/5ML syrup Take 5 mLs by mouth 4 (four) times daily as needed. 118 mL 0   rosuvastatin (CRESTOR) 20 MG tablet Take 1 tablet (20 mg total) by mouth daily. 90 tablet 1   traZODone (DESYREL) 50 MG tablet TAKE 1 TABLET BY MOUTH EVERYDAY AT BEDTIME 90 tablet 3   triamcinolone cream (KENALOG) 0.1 % Apply 1 application. topically 2 (two) times daily. 45 g 1   valACYclovir (VALTREX) 500  MG tablet Take 1 tablet (500 mg total) by mouth 2 (two) times daily. 14 tablet 1   Vitamin D, Ergocalciferol, (DRISDOL) 1.25 MG (50000 UNIT) CAPS capsule TAKE 1 CAPSULE (50,000 UNITS TOTAL) BY MOUTH TWO TIMES A WEEK 24 capsule 0   VRAYLAR 3 MG capsule Take 1 capsule (3 mg total) by mouth daily. 90 capsule 1   No current facility-administered medications on file prior to visit.   Past Medical History:  Diagnosis Date   ADD (attention deficit disorder)    Arthritis    Asthma    seasonal   Bipolar affective disorder, current episode depressed (HCC)    GERD (gastroesophageal reflux disease)    occ   History of recurrent UTIs 03/11/2012   Hypertension    Past Surgical History:  Procedure Laterality Date   ABDOMINAL HYSTERECTOMY  2010   endometriosis. Total hysterectomy   ANTERIOR CERVICAL DECOMP/DISCECTOMY FUSION N/A 07/13/2012   Procedure: ANTERIOR CERVICAL DECOMPRESSION/DISCECTOMY FUSION 1 LEVEL;  Surgeon: Mariam Dollar, MD;  Location: [facility];  Service: Neurosurgery;  Laterality: N/A;  Anterior Cervical Decompression/Discectomy Cervical Three-Four   CARPAL TUNNEL RELEASE Right    CERVICAL DISC SURGERY  09   CHOLECYSTECTOMY      Family History  Problem Relation Age of Onset   Breast cancer Neg Hx    Social History   Socioeconomic History   Marital status: Married    Spouse name: Not on file   Number of children: Not on file   Years of education: Not on file   Highest education level: Not on file  Occupational History   Not on file  Tobacco Use   Smoking status: Never   Smokeless tobacco: Never  Vaping Use   Vaping Use: Never used  Substance and Sexual Activity   Alcohol use: No   Drug use: No    Comment: 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 (Lbs) 240  242  BMI 42.51 kg/m2  42.87 kg/m2    Physical Exam Vitals reviewed.  Constitutional:      Appearance: Normal appearance. She is obese.  Neck:     Vascular: No carotid bruit.  Cardiovascular:     Rate and Rhythm: Normal rate and regular rhythm.     Heart sounds: Normal heart sounds.  Pulmonary:     Effort: Pulmonary effort is normal. No respiratory distress.     Breath sounds: Normal breath sounds.  Abdominal:     General: Abdomen is flat. Bowel sounds are normal.     Palpations: Abdomen is soft.     Tenderness: There is no abdominal tenderness.  Neurological:     Mental Status: She is alert and oriented to person, place, and time.  Psychiatric:        Mood and Affect: Mood normal.        Behavior: Behavior normal.        Lab Results  Component Value Date   WBC 9.0 12/31/2021   HGB 13.4 12/31/2021   HCT 39.8 12/31/2021   PLT 288 12/31/2021   GLUCOSE 106 (H) 12/31/2021   CHOL 165 12/31/2021   TRIG 83 12/31/2021   HDL 57 12/31/2021   LDLCALC 92 12/31/2021   ALT 17 12/31/2021   AST 18 12/31/2021   NA 142 12/31/2021   K 4.2 12/31/2021   CL 102 12/31/2021   CREATININE 1.08 (H) 12/31/2021   BUN 9 12/31/2021   CO2 22 12/31/2021   TSH 1.000 03/29/2021   HGBA1C 6.2 (H) 12/31/2021      Assessment & Plan:    Attention deficit hyperactivity disorder (ADHD), combined type  Bipolar 1 disorder, depressed, mild (HCC) -     ALPRAZolam; TAKE 1 TABLET BY MOUTH DAILY AS NEEDED FOR SEVERE ANXIETY  Dispense: 30 tablet; Refill: 0  Essential hypertension, benign -      CBC with Differential/Platelet -     Comprehensive metabolic panel  Mixed hyperlipidemia -     Lipid panel  Vitamin D deficiency  Prediabetes -     Hemoglobin A1c  Morbid obesity ([place]) -     Zepbound; Inject 2.5 mg into the skin once a week.  Dispense: 2 mL; Refill: 0 -     Zepbound; Inject 5 mg into the skin once a week.  Dispense: 2 mL; Refill: 0 -     Tirzepatide-Weight Management; Inject 7.5 mg into the skin once a week.  Dispense: 2 mL; Refill: 0     Meds ordered this encounter  Medications   tirzepatide (ZEPBOUND) 2.5 MG/0.5ML Pen    Sig: Inject 2.5 mg into the skin once a week.    Dispense:  2 mL    Refill:  0   tirzepatide (ZEPBOUND) 5 MG/0.5ML Pen    Sig: Inject 5 mg into the skin once a week.    Dispense:  2 mL    Refill:  0   tirzepatide (ZEPBOUND) 7.5 MG/0.5ML Pen    Sig: Inject 7.5 mg into the skin once a week.    Dispense:  2 mL    Refill:  0   ALPRAZolam (XANAX) 0.5 MG tablet    Sig: TAKE 1 TABLET BY MOUTH DAILY AS NEEDED FOR SEVERE ANXIETY    Dispense:  30 tablet    Refill:  0    This request is  for a new prescription for a controlled substance as required by Federal/State law.    Orders Placed This Encounter  Procedures   CBC with Differential/Platelet   Comprehensive metabolic panel   Hemoglobin A1c   Lipid panel     Follow-up: Return in about 3 months (around 07/11/2022) for chronic fasting.  An After Visit Summary was printed and given to the patient.  Rochel Brome, MD [facility] [phone]

## 2022-04-12 ENCOUNTER — Ambulatory Visit: Payer: BC Managed Care – PPO | Admitting: Family Medicine

## 2022-04-12 VITALS — BP 128/80 | HR 92 | Temp 97.2°F | Resp 12 | Ht 63.0 in | Wt 240.0 lb

## 2022-04-12 DIAGNOSIS — F4310 Post-traumatic stress disorder, unspecified: Secondary | ICD-10-CM

## 2022-04-12 DIAGNOSIS — E782 Mixed hyperlipidemia: Secondary | ICD-10-CM | POA: Diagnosis not present

## 2022-04-12 DIAGNOSIS — F902 Attention-deficit hyperactivity disorder, combined type: Secondary | ICD-10-CM

## 2022-04-12 DIAGNOSIS — I10 Essential (primary) hypertension: Secondary | ICD-10-CM | POA: Diagnosis not present

## 2022-04-12 DIAGNOSIS — E559 Vitamin D deficiency, unspecified: Secondary | ICD-10-CM

## 2022-04-12 DIAGNOSIS — R7303 Prediabetes: Secondary | ICD-10-CM | POA: Diagnosis not present

## 2022-04-12 DIAGNOSIS — F3131 Bipolar disorder, current episode depressed, mild: Secondary | ICD-10-CM

## 2022-04-12 MED ORDER — ZEPBOUND 2.5 MG/0.5ML ~~LOC~~ SOAJ: 2.5000 mg | SUBCUTANEOUS | 0 refills | Status: DC

## 2022-04-12 MED ORDER — ZEPBOUND 5 MG/0.5ML ~~LOC~~ SOAJ: 5.0000 mg | SUBCUTANEOUS | 0 refills | Status: DC

## 2022-04-12 MED ORDER — TIRZEPATIDE-WEIGHT MANAGEMENT 7.5 MG/0.5ML ~~LOC~~ SOAJ: 7.5000 mg | SUBCUTANEOUS | 0 refills | Status: DC

## 2022-04-12 MED ORDER — ALPRAZOLAM 0.5 MG PO TABS: ORAL_TABLET | ORAL | 0 refills | Status: DC

## 2022-04-12 NOTE — Assessment & Plan Note (Signed)
The current medical regimen is effective;  continue present plan and medications.  Vraylar 3 mg daily, Paxil 37.5 mg daily and Trazodone 50 mg at bedtime.

## 2022-04-12 NOTE — Patient Instructions (Signed)
Start zepbound 2.5 mg weekly x 1 month.  Increase to 5 mg weekly x 1 month, Increase to 7.5 mg weekly x 1 month.

## 2022-04-12 NOTE — Assessment & Plan Note (Signed)
Start zepbound 2.5 mg weekly x 1 month.  Increase to 5 mg weekly x 1 month, Increase to 7.5 mg weekly x 1 month.  

## 2022-04-12 NOTE — Assessment & Plan Note (Signed)
Well controlled.  No changes to medicines.  Continue rosuvastatin 20 mg daily.  Continue Lovaza 1 g 1 capsule twice daily.  Continue to work on eating a healthy diet and exercise.  Labs drawn today.

## 2022-04-12 NOTE — Assessment & Plan Note (Signed)
Continue Adderall XR 20 mg every morning.

## 2022-04-12 NOTE — Assessment & Plan Note (Signed)
Well controlled.  No medication changes recommended. Continue healthy diet and exercise.  

## 2022-04-12 NOTE — Assessment & Plan Note (Addendum)
Continue vitamin D 50K BID

## 2022-04-13 LAB — CBC WITH DIFFERENTIAL/PLATELET
Basophils Absolute: 0.1 10*3/uL (ref 0.0–0.2)
Basos: 1 %
EOS (ABSOLUTE): 0.2 10*3/uL (ref 0.0–0.4)
Eos: 2 %
Hematocrit: 39.8 % (ref 34.0–46.6)
Hemoglobin: 13.3 g/dL (ref 11.1–15.9)
Immature Grans (Abs): 0.1 10*3/uL (ref 0.0–0.1)
Immature Granulocytes: 1 %
Lymphocytes Absolute: 2.6 10*3/uL (ref 0.7–3.1)
Lymphs: 25 %
MCH: 30.2 pg (ref 26.6–33.0)
MCHC: 33.4 g/dL (ref 31.5–35.7)
MCV: 90 fL (ref 79–97)
Monocytes Absolute: 0.7 10*3/uL (ref 0.1–0.9)
Monocytes: 7 %
Neutrophils Absolute: 6.5 10*3/uL (ref 1.4–7.0)
Neutrophils: 64 %
Platelets: 328 10*3/uL (ref 150–450)
RBC: 4.41 x10E6/uL (ref 3.77–5.28)
RDW: 12.8 % (ref 11.7–15.4)
WBC: 10.2 10*3/uL (ref 3.4–10.8)

## 2022-04-13 LAB — LIPID PANEL
Chol/HDL Ratio: 3.2 ratio (ref 0.0–4.4)
Cholesterol, Total: 215 mg/dL — ABNORMAL HIGH (ref 100–199)
HDL: 67 mg/dL (ref >39)
LDL Chol Calc (NIH): 132 mg/dL — ABNORMAL HIGH (ref 0–99)
Triglycerides: 90 mg/dL (ref 0–149)
VLDL Cholesterol Cal: 16 mg/dL (ref 5–40)

## 2022-04-13 LAB — COMPREHENSIVE METABOLIC PANEL
ALT: 47 IU/L — ABNORMAL HIGH (ref 0–32)
AST: 41 IU/L — ABNORMAL HIGH (ref 0–40)
Albumin/Globulin Ratio: 1.7 (ref 1.2–2.2)
Albumin: 4.3 g/dL (ref 3.8–4.9)
Alkaline Phosphatase: 124 IU/L — ABNORMAL HIGH (ref 44–121)
BUN/Creatinine Ratio: 8 — ABNORMAL LOW (ref 9–23)
BUN: 8 mg/dL (ref 6–24)
Bilirubin Total: 0.5 mg/dL (ref 0.0–1.2)
CO2: 24 mmol/L (ref 20–29)
Calcium: 9.2 mg/dL (ref 8.7–10.2)
Chloride: 102 mmol/L (ref 96–106)
Creatinine, Ser: 0.99 mg/dL (ref 0.57–1.00)
Globulin, Total: 2.6 g/dL (ref 1.5–4.5)
Glucose: 108 mg/dL — ABNORMAL HIGH (ref 70–99)
Potassium: 4.3 mmol/L (ref 3.5–5.2)
Sodium: 139 mmol/L (ref 134–144)
Total Protein: 6.9 g/dL (ref 6.0–8.5)
eGFR: 68 mL/min/{1.73_m2} (ref >59)

## 2022-04-13 LAB — CARDIOVASCULAR RISK ASSESSMENT

## 2022-04-13 LAB — HEMOGLOBIN A1C
Est. average glucose Bld gHb Est-mCnc: 128 mg/dL
Hgb A1c MFr Bld: 6.1 % — ABNORMAL HIGH (ref 4.8–5.6)

## 2022-04-14 ENCOUNTER — Encounter: Payer: Self-pay | Admitting: Family Medicine

## 2022-04-14 DIAGNOSIS — R7303 Prediabetes: Secondary | ICD-10-CM | POA: Insufficient documentation

## 2022-04-14 NOTE — Assessment & Plan Note (Signed)
Continue paxil cr, vraylar, and trazodone. Continue xanax as needed.

## 2022-04-14 NOTE — Assessment & Plan Note (Signed)
Recommend continue to work on eating healthy diet and exercise.  

## 2022-04-15 ENCOUNTER — Telehealth: Payer: BC Managed Care – PPO | Admitting: Physician Assistant

## 2022-04-15 ENCOUNTER — Telehealth: Payer: Self-pay | Admitting: Family Medicine

## 2022-04-15 DIAGNOSIS — U071 COVID-19: Secondary | ICD-10-CM | POA: Diagnosis not present

## 2022-04-15 MED ORDER — NIRMATRELVIR/RITONAVIR (PAXLOVID)TABLET: 3.0000 | ORAL_TABLET | Freq: Two times a day (BID) | ORAL | 0 refills | Status: AC

## 2022-04-15 NOTE — Telephone Encounter (Signed)
PT WAS SEEN LAST FRIDAY AND NOW HAS DEVELOPED COVID/FLU SXS. PER PCP TO SEEK UC OR TO USE MYCHART FOR A [place]

## 2022-04-15 NOTE — Patient Instructions (Signed)
Anna [name], thank you for joining Mar Daring, PA-C for today's virtual visit.  While this provider is not your primary care provider (PCP), if your PCP is located in our provider database this encounter information will be shared with them immediately following your visit.   [facility] account gives you access to today's visit and all your visits, tests, and labs performed at [facility] " click here if you don't have a [facility] account or go to [email or web address]  Consent: (Patient) Anna [name] provided verbal consent for this virtual visit at the beginning of the encounter.  Current Medications:  Current Outpatient Medications:    nirmatrelvir/ritonavir (PAXLOVID) 20 x 150 MG & 10 x 100MG  TABS, Take 3 tablets by mouth 2 (two) times daily for 5 days. (Take nirmatrelvir 150 mg two tablets twice daily for 5 days and ritonavir 100 mg one tablet twice daily for 5 days) Patient GFR is 68, Disp: 30 tablet, Rfl: 0   albuterol (VENTOLIN HFA) 108 (90 Base) MCG/ACT inhaler, Inhale 2 puffs into the lungs every 6 (six) hours as needed for wheezing or shortness of breath., Disp: 18 g, Rfl: 1   ALPRAZolam (XANAX) 0.5 MG tablet, TAKE 1 TABLET BY MOUTH DAILY AS NEEDED FOR SEVERE ANXIETY, Disp: 30 tablet, Rfl: 0   amLODipine-olmesartan (AZOR) 10-40 MG tablet, Take 1 tablet by mouth daily., Disp: 90 tablet, Rfl: 1   amoxicillin-clavulanate (AUGMENTIN) 875-125 MG tablet, Take 1 tablet by mouth 2 (two) times daily., Disp: 20 tablet, Rfl: 0   amphetamine-dextroamphetamine (ADDERALL XR) 30 MG 24 hr capsule, Take 1 capsule (30 mg total) by mouth every morning., Disp: 30 capsule, Rfl: 0   cyclobenzaprine (FLEXERIL) 5 MG tablet, TAKE 1 TABLET BY MOUTH THREE TIMES A DAY AS NEEDED FOR MUSCLE SPASMS, Disp: 60 tablet, Rfl: 1   fluticasone (FLONASE) 50 MCG/ACT nasal spray, SPRAY 2 SPRAYS INTO EACH NOSTRIL EVERY DAY, Disp: 48 mL, Rfl: 2   omega-3 acid ethyl esters (LOVAZA) 1  g capsule, TAKE 1 CAPSULE BY MOUTH TWICE A DAY, Disp: 180 capsule, Rfl: 0   ondansetron (ZOFRAN) 4 MG tablet, Take 1 tablet (4 mg total) by mouth every 8 (eight) hours as needed for nausea or vomiting., Disp: 20 tablet, Rfl: 0   PARoxetine (PAXIL-CR) 37.5 MG 24 hr tablet, TAKE 1 TABLET BY MOUTH EVERY DAY, Disp: 90 tablet, Rfl: 1   promethazine-dextromethorphan (PROMETHAZINE-DM) 6.25-15 MG/5ML syrup, Take 5 mLs by mouth 4 (four) times daily as needed., Disp: 118 mL, Rfl: 0   rosuvastatin (CRESTOR) 20 MG tablet, Take 1 tablet (20 mg total) by mouth daily., Disp: 90 tablet, Rfl: 1   tirzepatide (ZEPBOUND) 2.5 MG/0.5ML Pen, Inject 2.5 mg into the skin once a week., Disp: 2 mL, Rfl: 0   tirzepatide (ZEPBOUND) 5 MG/0.5ML Pen, Inject 5 mg into the skin once a week., Disp: 2 mL, Rfl: 0   tirzepatide (ZEPBOUND) 7.5 MG/0.5ML Pen, Inject 7.5 mg into the skin once a week., Disp: 2 mL, Rfl: 0   traZODone (DESYREL) 50 MG tablet, TAKE 1 TABLET BY MOUTH EVERYDAY AT BEDTIME, Disp: 90 tablet, Rfl: 3   triamcinolone cream (KENALOG) 0.1 %, Apply 1 application. topically 2 (two) times daily., Disp: 45 g, Rfl: 1   valACYclovir (VALTREX) 500 MG tablet, Take 1 tablet (500 mg total) by mouth 2 (two) times daily., Disp: 14 tablet, Rfl: 1   Vitamin D, Ergocalciferol, (DRISDOL) 1.25 MG (50000 UNIT) CAPS capsule, TAKE 1 CAPSULE (50,000 UNITS  TOTAL) BY MOUTH TWO TIMES A WEEK, Disp: 24 capsule, Rfl: 0   VRAYLAR 3 MG capsule, Take 1 capsule (3 mg total) by mouth daily., Disp: 90 capsule, Rfl: 1   Medications ordered in this encounter:  Meds ordered this encounter  Medications   nirmatrelvir/ritonavir (PAXLOVID) 20 x 150 MG & 10 x 100MG  TABS    Sig: Take 3 tablets by mouth 2 (two) times daily for 5 days. (Take nirmatrelvir 150 mg two tablets twice daily for 5 days and ritonavir 100 mg one tablet twice daily for 5 days) Patient GFR is 68    Dispense:  30 tablet    Refill:  0    Order Specific Question:   Supervising Provider     Answer:   Chase Picket [id number]     *If you need refills on other medications prior to your next appointment, please contact your pharmacy*  Follow-Up: Call back or seek an in-person evaluation if the symptoms worsen or if the condition fails to improve as anticipated.  Anna [name] [phone]  Care Instructions:  Nirmatrelvir; Ritonavir Tablets What is this medication? NIRMATRELVIR; RITONAVIR (NIR ma TREL vir; ri TOE na veer) treats mild to moderate COVID-19. It may help people who are at high risk of developing severe illness. It works by limiting the spread of the virus in your body. This medicine may be used for other purposes; ask your health care provider or pharmacist if you have questions. COMMON BRAND NAME(S): PAXLOVID What should I tell my care team before I take this medication? They need to know if you have any of these conditions: Any allergies Any serious illness Kidney disease Liver disease An unusual or allergic reaction to nirmatrelvir, ritonavir, other medications, foods, dyes, or preservatives Pregnant or trying to get pregnant Breast-feeding How should I use this medication? This product contains 2 different medications that are packaged together. For the standard dose, take 2 pink tablets of nirmatrelvir with 1 white tablet of ritonavir (3 tablets total) by mouth with water twice daily. Talk to your care team if you have kidney disease. You may need a different dose. Swallow the tablets whole. You can take it with or without food. If it upsets your stomach, take it with food. Take all of this medication unless your care team tells you to stop it early. Keep taking it even if you think you are better. Talk to your care team about the use of this medication in children. While it may be prescribed for children as young as 12 years for selected conditions, precautions do apply. Overdosage: If you think you have taken too much of this medicine  contact a poison control center or emergency room at once. NOTE: This medicine is only for you. Do not share this medicine with others. What if I miss a dose? If you miss a dose, take it as soon as you can unless it is more than 8 hours late. If it is more than 8 hours late, skip the missed dose. Take the next dose at the normal time. Do not take extra or 2 doses at the same time to make up for the missed dose. What may interact with this medication? Do not take this medication with any of the following medications: Alfuzosin Certain medications for anxiety or sleep like midazolam, triazolam Certain medications for cancer like apalutamide, enzalutamide Certain medications for cholesterol like lovastatin, simvastatin Certain medications for irregular heart beat like amiodarone, dronedarone, flecainide, propafenone, quinidine Certain  medications for pain like meperidine, piroxicam Certain medications for psychotic disorders like clozapine, lurasidone, pimozide Certain medications for seizures like carbamazepine, phenobarbital, phenytoin Colchicine Eletriptan Eplerenone Ergot alkaloids like dihydroergotamine, ergonovine, ergotamine, methylergonovine Finerenone Flibanserin Ivabradine Lomitapide Naloxegol Ranolazine Rifampin Sildenafil Silodosin St. John's Wort Tolvaptan Ubrogepant Voclosporin This medication may also interact with the following medications: Bedaquiline Birth control pills Bosentan Certain antibiotics like erythromycin or clarithromycin Certain medications for blood pressure like amlodipine, diltiazem, felodipine, nicardipine, nifedipine Certain medications for cancer like abemaciclib, ceritinib, dasatinib, encorafenib, ibrutinib, ivosidenib, neratinib, nilotinib, venetoclax, vinblastine, vincristine Certain medications for cholesterol like atorvastatin, rosuvastatin Certain medications for depression like bupropion, trazodone Certain medications for fungal  infections like isavuconazonium, itraconazole, ketoconazole, voriconazole Certain medications for hepatitis C like elbasvir; grazoprevir, dasabuvir; ombitasvir; paritaprevir; ritonavir, glecaprevir; pibrentasvir, sofosbuvir; velpatasvir; voxilaprevir Certain medications for HIV or AIDS Certain medications for irregular heartbeat like lidocaine Certain medications that treat or prevent blood clots like rivaroxaban, warfarin Digoxin Fentanyl Medications that lower your chance of fighting infection like cyclosporine, sirolimus, tacrolimus Methadone Quetiapine Rifabutin Salmeterol Steroid medications like betamethasone, budesonide, ciclesonide, dexamethasone, fluticasone, methylprednisolone, mometasone, triamcinolone This list may not describe all possible interactions. Give your health care provider a list of all the medicines, herbs, non-prescription drugs, or dietary supplements you use. Also tell them if you smoke, drink alcohol, or use illegal drugs. Some items may interact with your medicine. What should I watch for while using this medication? Your condition will be monitored carefully while you are receiving this medication. Visit your care team for regular checkups. Tell your care team if your symptoms do not start to get better or if they get worse. If you have untreated HIV infection, this medication may lead to some HIV medications not working as well in the future. Estrogen and progestin hormones may not work as well while you are taking this medication. Your care team can help you find the contraceptive option that works for you. What side effects may I notice from receiving this medication? Side effects that you should report to your care team as soon as possible: Allergic reactions--skin rash, itching, hives, swelling of the face, lips, tongue, or throat Liver injury--right upper belly pain, loss of appetite, nausea, light-colored stool, dark yellow or brown urine, yellowing skin or  eyes, unusual weakness or fatigue Redness, blistering, peeling, or loosening of the skin, including inside the mouth Side effects that usually do not require medical attention (report these to your care team if they continue or are bothersome): Change in taste Diarrhea General discomfort and fatigue Increase in blood pressure Muscle pain Nausea Stomach pain This list may not describe all possible side effects. Call your doctor for medical advice about side effects. You may report side effects to FDA at 1-800-FDA-1088. Where should I keep my medication? Keep out of the reach of children and pets. Store at room temperature between 20 and 25 degrees C (68 and 77 degrees F). Get rid of any unused medication after the expiration date. To get rid of medications that are no longer needed or have expired: Take the medication to a medication take-back program. Check with your pharmacy or law enforcement to find a location. If you cannot return the medication, check the label or package insert to see if the medication should be thrown out in the garbage or flushed down the toilet. If you are not sure, ask your care team. If it is safe to put it in the trash, take the medication out of the container. Mix the  medication with cat litter, dirt, coffee grounds, or other unwanted substance. Seal the mixture in a bag or container. Put it in the trash. NOTE: This sheet is a summary. It may not cover all possible information. If you have questions about this medicine, talk to your doctor, pharmacist, or health care provider.  2023 Elsevier/Gold Standard (2020-03-06 00:00:00)    Isolation Instructions: You are to isolate at home for 5 days from onset of your symptoms. If you must be around other household members who do not have symptoms, you need to make sure that both you and the family members are masking consistently with a high-quality mask.  After day 5 of isolation, if you have had no fever within 24  hours and you are feeling better, you can end isolation but need to mask for an additional 5 days.  After day 5 if you have a fever or are having significant symptoms, please isolate for full 10 days.  If you note any worsening of symptoms despite treatment, please seek an in-person evaluation ASAP. If you note any significant shortness of breath or any chest pain, please seek ER evaluation. Please do not delay care!   COVID-19: What to Do if You Are Sick If you test positive and are an older adult or someone who is at high risk of getting very sick from COVID-19, treatment may be available. Contact a healthcare provider right away after a positive test to determine if you are eligible, even if your symptoms are mild right now. You can also visit a Test to Treat location and, if eligible, receive a prescription from a provider. Don't delay: Treatment must be started within the first few days to be effective. If you have a fever, cough, or other symptoms, you might have COVID-19. Most people have mild illness and are able to recover at home. If you are sick: Keep track of your symptoms. If you have an emergency warning sign (including trouble breathing), call 911. Steps to help prevent the spread of COVID-19 if you are sick If you are sick with COVID-19 or think you might have COVID-19, follow the steps below to care for yourself and to help protect other people in your home and community. Stay home except to get medical care Stay home. Most people with COVID-19 have mild illness and can recover at home without medical care. Do not leave your home, except to get medical care. Do not visit public areas and do not go to places where you are unable to wear a mask. Take care of yourself. Get rest and stay hydrated. Take over-the-counter medicines, such as acetaminophen, to help you feel better. Stay in touch with your doctor. Call before you get medical care. Be sure to get care if you have trouble  breathing, or have any other emergency warning signs, or if you think it is an emergency. Avoid public transportation, ride-sharing, or taxis if possible. Get tested If you have symptoms of COVID-19, get tested. While waiting for test results, stay away from others, including staying apart from those living in your household. Get tested as soon as possible after your symptoms start. Treatments may be available for people with COVID-19 who are at risk for becoming very sick. Don't delay: Treatment must be started early to be effective--some treatments must begin within 5 days of your first symptoms. Contact your healthcare provider right away if your test result is positive to determine if you are eligible. Self-tests are one of several options for  testing for the virus that causes COVID-19 and may be more convenient than laboratory-based tests and point-of-care tests. Ask your healthcare provider or your local health department if you need help interpreting your test results. You can visit your state, tribal, local, and territorial health department's website to look for the latest local information on testing sites. Separate yourself from other people As much as possible, stay in a specific room and away from other people and pets in your home. If possible, you should use a separate bathroom. If you need to be around other people or animals in or outside of the home, wear a well-fitting mask. Tell your close contacts that they may have been exposed to COVID-19. An infected person can spread COVID-19 starting 48 hours (or 2 days) before the person has any symptoms or tests positive. By letting your close contacts know they may have been exposed to COVID-19, you are helping to protect everyone. See COVID-19 and Animals if you have questions about pets. If you are diagnosed with COVID-19, someone from the health department may call you. Answer the call to slow the spread. Monitor your symptoms Symptoms of  COVID-19 include fever, cough, or other symptoms. Follow care instructions from your healthcare provider and local health department. Your local health authorities may give instructions on checking your symptoms and reporting information. When to seek emergency medical attention Look for emergency warning signs* for COVID-19. If someone is showing any of these signs, seek emergency medical care immediately: Trouble breathing Persistent pain or pressure in the chest New confusion Inability to wake or stay awake Pale, gray, or blue-colored skin, lips, or nail beds, depending on skin tone *This list is not all possible symptoms. Please call your medical provider for any other symptoms that are severe or concerning to you. Call 911 or call ahead to your local emergency facility: Notify the operator that you are seeking care for someone who has or may have COVID-19. Call ahead before visiting your doctor Call ahead. Many medical visits for routine care are being postponed or done by phone or telemedicine. If you have a medical appointment that cannot be postponed, call your doctor's office, and tell them you have or may have COVID-19. This will help the office protect themselves and other patients. If you are sick, wear a well-fitting mask You should wear a mask if you must be around other people or animals, including pets (even at home). Wear a mask with the best fit, protection, and comfort for you. You don't need to wear the mask if you are alone. If you can't put on a mask (because of trouble breathing, for example), cover your coughs and sneezes in some other way. Try to stay at least 6 feet away from other people. This will help protect the people around you. Masks should not be placed on young children under age 77 years, anyone who has trouble breathing, or anyone who is not able to remove the mask without help. Cover your coughs and sneezes Cover your mouth and nose with a tissue when you  cough or sneeze. Throw away used tissues in a lined trash can. Immediately wash your hands with soap and water for at least 20 seconds. If soap and water are not available, clean your hands with an alcohol-based hand sanitizer that contains at least 60% alcohol. Clean your hands often Wash your hands often with soap and water for at least 20 seconds. This is especially important after blowing your nose, coughing, or  sneezing; going to the bathroom; and before eating or preparing food. Use hand sanitizer if soap and water are not available. Use an alcohol-based hand sanitizer with at least 60% alcohol, covering all surfaces of your hands and rubbing them together until they feel dry. Soap and water are the best option, especially if hands are visibly dirty. Avoid touching your eyes, nose, and mouth with unwashed hands. Handwashing Tips Avoid sharing personal household items Do not share dishes, drinking glasses, cups, eating utensils, towels, or bedding with other people in your home. Wash these items thoroughly after using them with soap and water or put in the dishwasher. Clean surfaces in your home regularly Clean and disinfect high-touch surfaces (for example, doorknobs, tables, handles, light switches, and countertops) in your "sick room" and bathroom. In shared spaces, you should clean and disinfect surfaces and items after each use by the person who is ill. If you are sick and cannot clean, a caregiver or other person should only clean and disinfect the area around you (such as your bedroom and bathroom) on an as needed basis. Your caregiver/other person should wait as long as possible (at least several hours) and wear a mask before entering, cleaning, and disinfecting shared spaces that you use. Clean and disinfect areas that may have blood, stool, or body fluids on them. Use household cleaners and disinfectants. Clean visible dirty surfaces with household cleaners containing soap or  detergent. Then, use a household disinfectant. Use a product from [employer] List N: Disinfectants for Coronavirus (WIOXB-35). Be sure to follow the instructions on the label to ensure safe and effective use of the product. Many products recommend keeping the surface wet with a disinfectant for a certain period of time (look at "contact time" on the product label). You may also need to wear personal protective equipment, such as gloves, depending on the directions on the product label. Immediately after disinfecting, wash your hands with soap and water for 20 seconds. For completed guidance on cleaning and disinfecting your home, visit Complete Disinfection Guidance. Take steps to improve ventilation at home Improve ventilation (air flow) at home to help prevent from spreading COVID-19 to other people in your household. Clear out COVID-19 virus particles in the air by opening windows, using air filters, and turning on fans in your home. Use this interactive tool to learn how to improve air flow in your home. When you can be around others after being sick with COVID-19 Deciding when you can be around others is different for different situations. Find out when you can safely end home isolation. For any additional questions about your care, contact your healthcare provider or state or local health department. 05/30/2020 Content source: [facility] ([facility]), Division of Viral Diseases This information is not intended to replace advice given to you by your health care provider. Make sure you discuss any questions you have with your health care provider. Document Revised: 07/13/2020 Document Reviewed: 07/13/2020 Elsevier Patient Education  2022 [employer].       If you have been instructed to have an in-person evaluation today at a local Urgent Care facility, please use the link below. It will take you to a list of all of our available [facility], including address, phone number and hours of operation. Please do not delay care.  [facility]  If you or a family member do not have a primary care provider, use the link below to schedule a visit  and establish care. When you choose a [facility] primary care physician or advanced practice provider, you gain a long-term partner in health. Find a Primary Care Provider  Learn more about 's in-office and virtual care options: [facility]

## 2022-04-15 NOTE — Progress Notes (Signed)
Virtual Visit Consent   Anna [name], you are scheduled for a virtual visit with a [facility] provider today. Just as with appointments in the office, your consent must be obtained to participate. Your consent will be active for this visit and any virtual visit you may have with one of our providers in the next 365 days. If you have a MyChart account, a copy of this consent can be sent to you electronically.  As this is a virtual visit, video technology does not allow for your provider to perform a traditional examination. This may limit your provider's ability to fully assess your condition. If your provider identifies any concerns that need to be evaluated in person or the need to arrange testing (such as labs, EKG, etc.), we will make arrangements to do so. Although advances in technology are sophisticated, we cannot ensure that it will always work on either your end or our end. If the connection with a video visit is poor, the visit may have to be switched to a telephone visit. With either a video or telephone visit, we are not always able to ensure that we have a secure connection.  By engaging in this virtual visit, you consent to the provision of healthcare and authorize for your insurance to be billed (if applicable) for the services provided during this visit. Depending on your insurance coverage, you may receive a charge related to this service.  I need to obtain your verbal consent now. Are you willing to proceed with your visit today? Anna [name] has provided verbal consent on 04/15/2022 for a virtual visit (video or telephone). Mar Daring, PA-C  Date: 04/15/2022 4:53 PM  Virtual Visit via Video Note   I, Mar Daring, connected with  Anna [name]  ([id number], 08/27/1970) on 04/15/22 at  4:45 PM EST by a video-enabled telemedicine application and verified that I am speaking with the correct person using two identifiers.  Location: Patient: Virtual Visit Location Patient:  Home Provider: Virtual Visit Location Provider: Home Office   I discussed the limitations of evaluation and management by telemedicine and the availability of in person appointments. The patient expressed understanding and agreed to proceed.    History of Present Illness: Anna [name] is a 52 y.o. who identifies as a female who was assigned female at birth, and is being seen today for Covid 38.  HPI: URI  This is a new problem. Episode onset: Took an at home Covid 19 test today and was positive; symptoms started just this morning. The problem has been gradually worsening. The maximum temperature recorded prior to her arrival was 101 - 101.9 F. The fever has been present for Less than 1 day. Associated symptoms include congestion, coughing, ear pain (right), headaches, a plugged ear sensation (right), sinus pain and a sore throat (mild). Pertinent negatives include no diarrhea, nausea, rhinorrhea or vomiting. Associated symptoms comments: Fatigue, body aches, post nasal drainage, chills. She has tried NSAIDs for the symptoms. The treatment provided no relief.     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 (Take nirmatrelvir 150 mg two tablets twice daily for 5 days and ritonavir 100 mg one tablet twice daily for 5 days) Patient GFR is 68, Disp: 30 tablet, Rfl: 0   albuterol (VENTOLIN HFA) 108 (90 Base) MCG/ACT inhaler, Inhale 2 puffs into the lungs every 6 (six) hours as needed for wheezing or shortness of breath., Disp: 18 g, Rfl: 1   ALPRAZolam (XANAX) 0.5 MG tablet, TAKE 1 TABLET BY MOUTH DAILY AS NEEDED FOR SEVERE ANXIETY, Disp: 30 tablet, Rfl: 0   amLODipine-olmesartan (AZOR) 10-40 MG tablet, Take 1 tablet by mouth daily., Disp: 90 tablet, Rfl: 1   amoxicillin-clavulanate (AUGMENTIN) 875-125 MG tablet, Take 1 tablet by mouth 2 (two) times daily., Disp: 20 tablet, Rfl: 0   amphetamine-dextroamphetamine (ADDERALL XR) 30 MG 24 hr capsule, Take 1 capsule (30 mg total) by mouth every morning., Disp: 30 capsule, Rfl: 0   cyclobenzaprine (FLEXERIL) 5 MG tablet, TAKE 1 TABLET BY MOUTH THREE TIMES A DAY AS NEEDED FOR MUSCLE SPASMS, Disp: 60 tablet, Rfl: 1   fluticasone (FLONASE) 50 MCG/ACT nasal spray, SPRAY 2 SPRAYS INTO EACH NOSTRIL EVERY DAY, Disp: 48 mL, Rfl: 2   omega-3 acid ethyl esters (LOVAZA) 1 g capsule, TAKE 1 CAPSULE BY MOUTH TWICE A DAY, Disp: 180 capsule, Rfl: 0   ondansetron (ZOFRAN) 4 MG tablet, Take 1 tablet (4 mg total) by mouth every 8 (eight) hours as needed for nausea or vomiting., Disp: 20 tablet, Rfl: 0   PARoxetine (PAXIL-CR) 37.5 MG 24 hr tablet, TAKE 1 TABLET BY MOUTH EVERY DAY, Disp: 90 tablet, Rfl: 1   promethazine-dextromethorphan (PROMETHAZINE-DM) 6.25-15 MG/5ML syrup, Take 5 mLs by mouth 4 (four) times daily as needed., Disp: 118 mL, Rfl: 0   rosuvastatin (CRESTOR) 20 MG tablet, Take 1 tablet (20 mg total) by mouth daily., Disp: 90 tablet, Rfl: 1   tirzepatide  (ZEPBOUND) 2.5 MG/0.5ML Pen, Inject 2.5 mg into the skin once a week., Disp: 2 mL, Rfl: 0   tirzepatide (ZEPBOUND) 5 MG/0.5ML Pen, Inject 5 mg into the skin once a week., Disp: 2 mL, Rfl: 0   tirzepatide (ZEPBOUND) 7.5 MG/0.5ML Pen, Inject 7.5 mg into the skin once a week., Disp: 2 mL, Rfl: 0   traZODone (DESYREL) 50 MG tablet, TAKE 1 TABLET BY MOUTH EVERYDAY AT BEDTIME, Disp: 90 tablet, Rfl: 3   triamcinolone cream (KENALOG) 0.1 %, Apply 1 application. topically 2 (two) times daily., Disp: 45 g, Rfl: 1   valACYclovir (VALTREX) 500 MG tablet, Take 1 tablet (500 mg total) by mouth 2 (two) times daily., Disp: 14 tablet, Rfl: 1   Vitamin D, Ergocalciferol, (DRISDOL) 1.25 MG (50000 UNIT) CAPS capsule, TAKE 1 CAPSULE (50,000 UNITS TOTAL) BY MOUTH TWO TIMES A WEEK, Disp: 24 capsule, Rfl: 0   VRAYLAR 3 MG capsule, Take 1 capsule (3 mg total) by mouth daily., Disp: 90 capsule, Rfl: 1  Observations/Objective: Patient is well-developed, well-nourished  in no acute distress.  Resting comfortably at home.  Head is normocephalic, atraumatic.  No labored breathing.  Speech is clear and coherent with logical content.  Patient is alert and oriented at baseline.    Assessment and Plan: 1. COVID-19 - nirmatrelvir/ritonavir (PAXLOVID) 20 x 150 MG & 10 x 100MG  TABS; Take 3 tablets by mouth 2 (two) times daily for 5 days. (Take nirmatrelvir 150 mg two tablets twice daily for 5 days and ritonavir 100 mg one tablet twice daily for 5 days) Patient GFR is 68  Dispense: 30 tablet; Refill: 0  - Continue OTC symptomatic management of choice - Will send OTC vitamins and supplement information through AVS - Paxlovid prescribed; Hold rosuvastatin - Patient enrolled in MyChart symptom monitoring - Push fluids - Rest as needed - Discussed return precautions and when to seek in-person evaluation, sent via AVS as well   Follow Up Instructions: I discussed the assessment and treatment plan with the patient. The patient  was provided an opportunity to ask questions and all were answered. The patient agreed with the plan and demonstrated an understanding of the instructions.  A copy of instructions were sent to the patient via MyChart unless otherwise noted below.    The patient was advised to call back or seek an in-person evaluation if the symptoms worsen or if the condition fails to improve as anticipated.  Time:  I spent 11 minutes with the patient via telehealth technology discussing the above problems/concerns.    Mar Daring, PA-C

## 2022-04-16 ENCOUNTER — Encounter: Payer: Self-pay | Admitting: Physician Assistant

## 2022-04-17 ENCOUNTER — Other Ambulatory Visit: Payer: Self-pay

## 2022-04-17 MED ORDER — AMPHETAMINE-DEXTROAMPHET ER 30 MG PO CP24: 30.0000 mg | ORAL_CAPSULE | ORAL | 0 refills | Status: DC

## 2022-04-25 ENCOUNTER — Telehealth: Payer: BC Managed Care – PPO | Admitting: Emergency Medicine

## 2022-04-25 DIAGNOSIS — J0100 Acute maxillary sinusitis, unspecified: Secondary | ICD-10-CM

## 2022-04-25 MED ORDER — AMOXICILLIN-POT CLAVULANATE 875-125 MG PO TABS: 1.0000 | ORAL_TABLET | Freq: Two times a day (BID) | ORAL | 0 refills | Status: DC

## 2022-04-25 NOTE — Progress Notes (Signed)
Virtual Visit Consent   Anna [name], you are scheduled for a virtual visit with a [facility] provider today. Just as with appointments in the office, your consent must be obtained to participate. Your consent will be active for this visit and any virtual visit you may have with one of our providers in the next 365 days. If you have a MyChart account, a copy of this consent can be sent to you electronically.  As this is a virtual visit, video technology does not allow for your provider to perform a traditional examination. This may limit your provider's ability to fully assess your condition. If your provider identifies any concerns that need to be evaluated in person or the need to arrange testing (such as labs, EKG, etc.), we will make arrangements to do so. Although advances in technology are sophisticated, we cannot ensure that it will always work on either your end or our end. If the connection with a video visit is poor, the visit may have to be switched to a telephone visit. With either a video or telephone visit, we are not always able to ensure that we have a secure connection.  By engaging in this virtual visit, you consent to the provision of healthcare and authorize for your insurance to be billed (if applicable) for the services provided during this visit. Depending on your insurance coverage, you may receive a charge related to this service.  I need to obtain your verbal consent now. Are you willing to proceed with your visit today? Anna [name] has provided verbal consent on 04/25/2022 for a virtual visit (video or telephone). [name], PA-C  Date: 04/25/2022 10:11 AM  Virtual Visit via Video Note   I, [name], connected with  Anna [name]  ([id number], 11/18/1970) on 04/25/22 at 10:00 AM EST by a video-enabled telemedicine application and verified that I am speaking with the correct person using two identifiers.  Location: Patient: Virtual Visit Location Patient:  Home Provider: Virtual Visit Location Provider: Home Office   I discussed the limitations of evaluation and management by telemedicine and the availability of in person appointments. The patient expressed understanding and agreed to proceed.    History of Present Illness: Anna [name] is a 52 y.o. who identifies as a female who was assigned female at birth, and is being seen today for sinus congestion.  States that she had URI last week.  Has had worsening sinus pressure for over 2 weeks.  Denies recent fever.  Reports significant sinus pressure.  Denies any other symptoms.  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   Essential hypertension, benign 12/15/2012   Morbid obesity ([place]) 08/15/2012   Posttraumatic stress disorder 08/15/2012    Allergies:  Allergies  Allergen Reactions   Avelox [Moxifloxacin Hcl In Nacl] Anaphylaxis   Lactose Intolerance (Gi) Diarrhea   Tape Other (See Comments)    If tape on a while will pull off skin   Medications:  Current Outpatient Medications:    amoxicillin-clavulanate (AUGMENTIN)  875-125 MG tablet, Take 1 tablet by mouth every 12 (twelve) hours., Disp: 14 tablet, Rfl: 0   albuterol (VENTOLIN HFA) 108 (90 Base) MCG/ACT inhaler, Inhale 2 puffs into the lungs every 6 (six) hours as needed for wheezing or shortness of breath., Disp: 18 g, Rfl: 1   ALPRAZolam (XANAX) 0.5 MG tablet, TAKE 1 TABLET BY MOUTH DAILY AS NEEDED FOR SEVERE  ANXIETY, Disp: 30 tablet, Rfl: 0   amLODipine-olmesartan (AZOR) 10-40 MG tablet, Take 1 tablet by mouth daily., Disp: 90 tablet, Rfl: 1   amphetamine-dextroamphetamine (ADDERALL XR) 30 MG 24 hr capsule, Take 1 capsule (30 mg total) by mouth every morning., Disp: 30 capsule, Rfl: 0   cyclobenzaprine (FLEXERIL) 5 MG tablet, TAKE 1 TABLET BY MOUTH THREE TIMES A DAY AS NEEDED FOR MUSCLE SPASMS, Disp: 60 tablet, Rfl: 1   fluticasone (FLONASE) 50 MCG/ACT nasal spray, SPRAY 2 SPRAYS INTO EACH NOSTRIL EVERY DAY, Disp: 48 mL, Rfl: 2   omega-3 acid ethyl esters (LOVAZA) 1 g capsule, TAKE 1 CAPSULE BY MOUTH TWICE A DAY, Disp: 180 capsule, Rfl: 0   ondansetron (ZOFRAN) 4 MG tablet, Take 1 tablet (4 mg total) by mouth every 8 (eight) hours as needed for nausea or vomiting., Disp: 20 tablet, Rfl: 0   PARoxetine (PAXIL-CR) 37.5 MG 24 hr tablet, TAKE 1 TABLET BY MOUTH EVERY DAY, Disp: 90 tablet, Rfl: 1   promethazine-dextromethorphan (PROMETHAZINE-DM) 6.25-15 MG/5ML syrup, Take 5 mLs by mouth 4 (four) times daily as needed., Disp: 118 mL, Rfl: 0   rosuvastatin (CRESTOR) 20 MG tablet, Take 1 tablet (20 mg total) by mouth daily., Disp: 90 tablet, Rfl: 1   tirzepatide (ZEPBOUND) 2.5 MG/0.5ML Pen, Inject 2.5 mg into the skin once a week., Disp: 2 mL, Rfl: 0   tirzepatide (ZEPBOUND) 5 MG/0.5ML Pen, Inject 5 mg into the skin once a week., Disp: 2 mL, Rfl: 0   tirzepatide (ZEPBOUND) 7.5 MG/0.5ML Pen, Inject 7.5 mg into the skin once a week., Disp: 2 mL, Rfl: 0   traZODone (DESYREL) 50 MG tablet, TAKE 1 TABLET BY MOUTH EVERYDAY AT BEDTIME, Disp: 90 tablet, Rfl: 3   triamcinolone cream (KENALOG) 0.1 %, Apply 1 application. topically 2 (two) times daily., Disp: 45 g, Rfl: 1   valACYclovir (VALTREX) 500 MG tablet, Take 1 tablet (500 mg total) by mouth 2 (two) times daily., Disp: 14 tablet, Rfl: 1   Vitamin D, Ergocalciferol, (DRISDOL) 1.25 MG (50000 UNIT) CAPS capsule, TAKE 1 CAPSULE (50,000 UNITS TOTAL) BY MOUTH TWO TIMES A  WEEK, Disp: 24 capsule, Rfl: 0   VRAYLAR 3 MG capsule, Take 1 capsule (3 mg total) by mouth daily., Disp: 90 capsule, Rfl: 1  Observations/Objective: Patient is well-developed, well-nourished in no acute distress.  Resting comfortably  at home.  Head is normocephalic, atraumatic.  No labored breathing.  Speech is clear and coherent with logical content.  Patient is alert and oriented at baseline.    Assessment and Plan: 1. Acute non-recurrent maxillary sinusitis   Meds ordered this encounter  Medications   amoxicillin-clavulanate (AUGMENTIN) 875-125 MG tablet    Sig: Take 1 tablet by mouth every 12 (twelve) hours.    Dispense:  14 tablet    Refill:  0    Order Specific Question:   Supervising Provider    Answer:   Chase Picket [id number]   Worsening sinus pressure for 2 weeks.  Will trial Augmentin.  OTC nasal saline recommended as well.  Follow Up Instructions: I discussed the assessment  and treatment plan with the patient. The patient was provided an opportunity to ask questions and all were answered. The patient agreed with the plan and demonstrated an understanding of the instructions.  A copy of instructions were sent to the patient via MyChart unless otherwise noted below.     The patient was advised to call back or seek an in-person evaluation if the symptoms worsen or if the condition fails to improve as anticipated.  Time:  I spent 11 minutes with the patient via telehealth technology discussing the above problems/concerns.    [name], PA-C

## 2022-04-25 NOTE — Patient Instructions (Signed)
Anna [name], thank you for joining [name], PA-C for today's virtual visit.  While this provider is not your primary care provider (PCP), if your PCP is located in our provider database this encounter information will be shared with them immediately following your visit.   [name] account gives you access to today's visit and all your visits, tests, and labs performed at [facility] " click here if you don't have a [name] account or go to [email or web address]  Consent: (Patient) Anna [name] provided verbal consent for this virtual visit at the beginning of the encounter.  Current Medications:  Current Outpatient Medications:    amoxicillin-clavulanate (AUGMENTIN) 875-125 MG tablet, Take 1 tablet by mouth every 12 (twelve) hours., Disp: 14 tablet, Rfl: 0   albuterol (VENTOLIN HFA) 108 (90 Base) MCG/ACT inhaler, Inhale 2 puffs into the lungs every 6 (six) hours as needed for wheezing or shortness of breath., Disp: 18 g, Rfl: 1   ALPRAZolam (XANAX) 0.5 MG tablet, TAKE 1 TABLET BY MOUTH DAILY AS NEEDED FOR SEVERE ANXIETY, Disp: 30 tablet, Rfl: 0   amLODipine-olmesartan (AZOR) 10-40 MG tablet, Take 1 tablet by mouth daily., Disp: 90 tablet, Rfl: 1   amphetamine-dextroamphetamine (ADDERALL XR) 30 MG 24 hr capsule, Take 1 capsule (30 mg total) by mouth every morning., Disp: 30 capsule, Rfl: 0   cyclobenzaprine (FLEXERIL) 5 MG tablet, TAKE 1 TABLET BY MOUTH THREE TIMES A DAY AS NEEDED FOR MUSCLE SPASMS, Disp: 60 tablet, Rfl: 1   fluticasone (FLONASE) 50 MCG/ACT nasal spray, SPRAY 2 SPRAYS INTO EACH NOSTRIL EVERY DAY, Disp: 48 mL, Rfl: 2   omega-3 acid ethyl esters (LOVAZA) 1 g capsule, TAKE 1 CAPSULE BY MOUTH TWICE A DAY, Disp: 180 capsule, Rfl: 0   ondansetron (ZOFRAN) 4 MG tablet, Take 1 tablet (4 mg total) by mouth every 8 (eight) hours as needed for nausea or vomiting., Disp: 20 tablet, Rfl: 0   PARoxetine (PAXIL-CR) 37.5 MG 24 hr tablet, TAKE 1  TABLET BY MOUTH EVERY DAY, Disp: 90 tablet, Rfl: 1   promethazine-dextromethorphan (PROMETHAZINE-DM) 6.25-15 MG/5ML syrup, Take 5 mLs by mouth 4 (four) times daily as needed., Disp: 118 mL, Rfl: 0   rosuvastatin (CRESTOR) 20 MG tablet, Take 1 tablet (20 mg total) by mouth daily., Disp: 90 tablet, Rfl: 1   tirzepatide (ZEPBOUND) 2.5 MG/0.5ML Pen, Inject 2.5 mg into the skin once a week., Disp: 2 mL, Rfl: 0   tirzepatide (ZEPBOUND) 5 MG/0.5ML Pen, Inject 5 mg into the skin once a week., Disp: 2 mL, Rfl: 0   tirzepatide (ZEPBOUND) 7.5 MG/0.5ML Pen, Inject 7.5 mg into the skin once a week., Disp: 2 mL, Rfl: 0   traZODone (DESYREL) 50 MG tablet, TAKE 1 TABLET BY MOUTH EVERYDAY AT BEDTIME, Disp: 90 tablet, Rfl: 3   triamcinolone cream (KENALOG) 0.1 %, Apply 1 application. topically 2 (two) times daily., Disp: 45 g, Rfl: 1   valACYclovir (VALTREX) 500 MG tablet, Take 1 tablet (500 mg total) by mouth 2 (two) times daily., Disp: 14 tablet, Rfl: 1   Vitamin D, Ergocalciferol, (DRISDOL) 1.25 MG (50000 UNIT) CAPS capsule, TAKE 1 CAPSULE (50,000 UNITS TOTAL) BY MOUTH TWO TIMES A WEEK, Disp: 24 capsule, Rfl: 0   VRAYLAR 3 MG capsule, Take 1 capsule (3 mg total) by mouth daily., Disp: 90 capsule, Rfl: 1   Medications ordered in this encounter:  Meds ordered this encounter  Medications   amoxicillin-clavulanate (AUGMENTIN) 875-125 MG tablet    Sig:  Take 1 tablet by mouth every 12 (twelve) hours.    Dispense:  14 tablet    Refill:  0    Order Specific Question:   Supervising Provider    Answer:   Chase Picket [id number]     *If you need refills on other medications prior to your next appointment, please contact your pharmacy*  Follow-Up: Call back or seek an in-person evaluation if the symptoms worsen or if the condition fails to improve as anticipated.  [name] [phone]  Other Instructions    If you have been instructed to have an in-person evaluation today at a local  Urgent Care facility, please use the link below. It will take you to a list of all of our available [facility], including address, phone number and hours of operation. Please do not delay care.  [name]  If you or a family member do not have a primary care provider, use the link below to schedule a visit and establish care. When you choose a [facility] primary care physician or advanced practice provider, you gain a long-term partner in health. Find a Primary Care Provider  Learn more about [facility]'s in-office and virtual care options: [facility]

## 2022-04-26 ENCOUNTER — Encounter: Payer: Self-pay | Admitting: Family Medicine

## 2022-05-08 ENCOUNTER — Other Ambulatory Visit: Payer: Self-pay

## 2022-05-08 MED ORDER — AMPHETAMINE-DEXTROAMPHET ER 30 MG PO CP24: 30.0000 mg | ORAL_CAPSULE | ORAL | 0 refills | Status: DC

## 2022-05-17 ENCOUNTER — Other Ambulatory Visit: Payer: Self-pay

## 2022-05-17 DIAGNOSIS — F3131 Bipolar disorder, current episode depressed, mild: Secondary | ICD-10-CM

## 2022-05-17 MED ORDER — ALPRAZOLAM 0.5 MG PO TABS: ORAL_TABLET | ORAL | 2 refills | Status: DC

## 2022-05-27 ENCOUNTER — Other Ambulatory Visit: Payer: Self-pay

## 2022-05-28 ENCOUNTER — Encounter: Payer: Self-pay | Admitting: Family Medicine

## 2022-05-28 ENCOUNTER — Ambulatory Visit: Payer: BC Managed Care – PPO | Admitting: Family Medicine

## 2022-05-28 VITALS — BP 124/72 | HR 88 | Temp 97.5°F | Ht 63.0 in | Wt 239.0 lb

## 2022-05-28 DIAGNOSIS — J028 Acute pharyngitis due to other specified organisms: Secondary | ICD-10-CM | POA: Diagnosis not present

## 2022-05-28 DIAGNOSIS — R051 Acute cough: Secondary | ICD-10-CM

## 2022-05-28 DIAGNOSIS — J018 Other acute sinusitis: Secondary | ICD-10-CM

## 2022-05-28 LAB — POC COVID19 BINAXNOW: SARS Coronavirus 2 Ag: NEGATIVE

## 2022-05-28 MED ORDER — AMOXICILLIN-POT CLAVULANATE 875-125 MG PO TABS: 1.0000 | ORAL_TABLET | Freq: Two times a day (BID) | ORAL | 0 refills | Status: DC

## 2022-05-28 MED ORDER — TRIAMCINOLONE ACETONIDE 40 MG/ML IJ SUSP
80.0000 mg | Freq: Once | INTRAMUSCULAR | Status: AC
  Administered 2022-05-28: 80 mg via INTRAMUSCULAR

## 2022-05-28 NOTE — Progress Notes (Unsigned)
Acute Office Visit  Subjective:    Patient ID: Anna [name], female    DOB: [birth year], 52 y.o.   MRN: [id number]  Chief Complaint  Patient presents with   Sinusitis    HPI: Patient is in today for uri. Symptoms started 1 week ago complains of facial pain and pressure, runny nose, sore throat, headache, no dizziness, dry cough sometimes a productive cough. Has taken mucinex dm and ibuprofen which has helped some. No allergy medicines. Covid 19 neg.  Past Medical History:  Diagnosis Date   ADD (attention deficit disorder)    Arthritis    Asthma    seasonal   Bipolar affective disorder, current episode depressed ([facility])    GERD (gastroesophageal reflux disease)    occ   History of recurrent UTIs 03/11/2012   Hypertension    Moderate persistent asthma with exacerbation 12/16/2021    Past Surgical History:  Procedure Laterality Date   ABDOMINAL HYSTERECTOMY  2010   endometriosis. Total hysterectomy   ANTERIOR CERVICAL DECOMP/DISCECTOMY FUSION N/A 07/13/2012   Procedure: ANTERIOR CERVICAL DECOMPRESSION/DISCECTOMY FUSION 1 LEVEL;  Surgeon: Elaina Hoops, MD;  Location: [facility];  Service: Neurosurgery;  Laterality: N/A;  Anterior Cervical Decompression/Discectomy Cervical Three-Four   CARPAL TUNNEL RELEASE Right    CERVICAL DISC SURGERY  09   CHOLECYSTECTOMY      Family History  Problem Relation Age of Onset   Breast cancer Neg Hx     Social History   Socioeconomic History   Marital status: Married    Spouse name: Not on file   Number of children: Not on file   Years of education: Not on file   Highest education level: Not on file  Occupational History   Not on file  Tobacco Use   Smoking status: Never   Smokeless tobacco: Never  Vaping Use   Vaping Use: Never used  Substance and Sexual Activity   Alcohol use: No   Drug use: No    Comment: 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   Sexual activity: Not on file  Other Topics Concern   Not on file  Social History  Narrative   Not on file   Social Determinants of Health   Financial Resource Strain: Low Risk  (05/28/2022)   Overall Financial Resource Strain (CARDIA)    Difficulty of Paying Living Expenses: Not hard at all  Food Insecurity: No Food Insecurity (05/28/2022)   Hunger Vital Sign    Worried About Running Out of Food in the Last Year: Never true    Bliss in the Last Year: Never true  Transportation Needs: No Transportation Needs (05/28/2022)   PRAPARE - Hydrologist (Medical): No    Lack of Transportation (Non-Medical): No  Physical Activity: Inactive (05/28/2022)   Exercise Vital Sign    Days of Exercise per Week: 0 days    Minutes of Exercise per Session: 0 min  Stress: No Stress Concern Present (05/28/2022)   [place]    Feeling of Stress : Not at all  Social Connections: Moderately Integrated (05/28/2022)   Social Connection and Isolation Panel [NHANES]    Frequency of Communication with Friends and Family: More than three times a week    Frequency of Social Gatherings with Friends and Family: Three times a week    Attends Religious Services: More than 4 times per year    Active Member of Clubs or Organizations:  No    Attends Club or Organization Meetings: Never    Marital Status: Married  Human resources officer Violence: Not At Risk (05/28/2022)   Humiliation, Afraid, Rape, and Kick questionnaire    Fear of Current or Ex-Partner: No    Emotionally Abused: No    Physically Abused: No    Sexually Abused: No    Outpatient Medications Prior to Visit  Medication Sig Dispense Refill   albuterol (VENTOLIN HFA) 108 (90 Base) MCG/ACT inhaler Inhale 2 puffs into the lungs every 6 (six) hours as needed for wheezing or shortness of breath. 18 g 1   ALPRAZolam (XANAX) 0.5 MG tablet TAKE 1 TABLET BY MOUTH DAILY AS NEEDED FOR SEVERE ANXIETY 30 tablet 2   amLODipine-olmesartan (AZOR) 10-40 MG  tablet Take 1 tablet by mouth daily. 90 tablet 1   amphetamine-dextroamphetamine (ADDERALL XR) 30 MG 24 hr capsule Take 1 capsule (30 mg total) by mouth every morning. 30 capsule 0   cyclobenzaprine (FLEXERIL) 5 MG tablet TAKE 1 TABLET BY MOUTH THREE TIMES A DAY AS NEEDED FOR MUSCLE SPASMS 60 tablet 1   fluticasone (FLONASE) 50 MCG/ACT nasal spray SPRAY 2 SPRAYS INTO EACH NOSTRIL EVERY DAY 48 mL 2   omega-3 acid ethyl esters (LOVAZA) 1 g capsule TAKE 1 CAPSULE BY MOUTH TWICE A DAY 180 capsule 0   ondansetron (ZOFRAN) 4 MG tablet Take 1 tablet (4 mg total) by mouth every 8 (eight) hours as needed for nausea or vomiting. 20 tablet 0   PARoxetine (PAXIL-CR) 37.5 MG 24 hr tablet TAKE 1 TABLET BY MOUTH EVERY DAY 90 tablet 1   promethazine-dextromethorphan (PROMETHAZINE-DM) 6.25-15 MG/5ML syrup Take 5 mLs by mouth 4 (four) times daily as needed. 118 mL 0   rosuvastatin (CRESTOR) 20 MG tablet Take 1 tablet (20 mg total) by mouth daily. 90 tablet 1   traZODone (DESYREL) 50 MG tablet TAKE 1 TABLET BY MOUTH EVERYDAY AT BEDTIME 90 tablet 3   triamcinolone cream (KENALOG) 0.1 % Apply 1 application. topically 2 (two) times daily. 45 g 1   valACYclovir (VALTREX) 500 MG tablet Take 1 tablet (500 mg total) by mouth 2 (two) times daily. 14 tablet 1   Vitamin D, Ergocalciferol, (DRISDOL) 1.25 MG (50000 UNIT) CAPS capsule TAKE 1 CAPSULE (50,000 UNITS TOTAL) BY MOUTH TWO TIMES A WEEK 24 capsule 0   VRAYLAR 3 MG capsule Take 1 capsule (3 mg total) by mouth daily. 90 capsule 1   amoxicillin-clavulanate (AUGMENTIN) 875-125 MG tablet Take 1 tablet by mouth every 12 (twelve) hours. 14 tablet 0   tirzepatide (ZEPBOUND) 2.5 MG/0.5ML Pen Inject 2.5 mg into the skin once a week. 2 mL 0   tirzepatide (ZEPBOUND) 5 MG/0.5ML Pen Inject 5 mg into the skin once a week. 2 mL 0   tirzepatide (ZEPBOUND) 7.5 MG/0.5ML Pen Inject 7.5 mg into the skin once a week. 2 mL 0   No facility-administered medications prior to visit.     Allergies  Allergen Reactions   Avelox [Moxifloxacin Hcl In Nacl] Anaphylaxis   Lactose Intolerance (Gi) Diarrhea   Tape Other (See Comments)    If tape on a while will pull off skin    Review of Systems  Constitutional:  Negative for chills, fatigue and fever.  HENT:  Positive for congestion, postnasal drip, rhinorrhea, sinus pressure, sinus pain, sneezing and sore throat. Negative for ear pain.   Respiratory:  Positive for cough. Negative for shortness of breath.   Cardiovascular:  Negative for chest pain.  Gastrointestinal:  Positive for nausea. Negative for abdominal pain, constipation, diarrhea and vomiting.  Genitourinary:  Negative for dysuria and urgency.  Musculoskeletal:  Negative for back pain and myalgias.  Neurological:  Positive for headaches. Negative for dizziness, weakness and light-headedness.  Psychiatric/Behavioral:  Negative for dysphoric mood. The patient is not nervous/anxious.        Objective:        05/28/2022    3:53 PM 04/12/2022    7:40 AM 03/21/2022    3:51 PM  Vitals with BMI  Height 5\' 3"  5\' 3"    Weight 239 lbs 240 lbs   BMI [id number]   Systolic [id number]  Diastolic 72 80 80  Pulse 88 92     No data found.   Physical Exam Vitals reviewed.  Constitutional:      Appearance: Normal appearance.  HENT:     Right Ear: Tympanic membrane, ear canal and external ear normal.     Left Ear: Tympanic membrane, ear canal and external ear normal.     Nose: Congestion and rhinorrhea present.     Comments: Sinus tenderness    Mouth/Throat:     Pharynx: Oropharynx is clear. Posterior oropharyngeal erythema present.  Cardiovascular:     Rate and Rhythm: Normal rate and regular rhythm.     Heart sounds: Normal heart sounds. No murmur heard. Pulmonary:     Effort: Pulmonary effort is normal. No respiratory distress.     Breath sounds: Normal breath sounds.  Lymphadenopathy:     Cervical: No cervical adenopathy.  Neurological:     Mental  Status: She is alert and oriented to person, place, and time.  Psychiatric:        Mood and Affect: Mood normal.        Behavior: Behavior normal.     Health Maintenance Due  Topic Date Due   DTaP/Tdap/Td (1 - Tdap) Never done   Fecal DNA (Cologuard)  Never done   Zoster Vaccines- Shingrix (1 of 2) Never done    There are no preventive care reminders to display for this patient.   Lab Results  Component Value Date   TSH 1.000 03/29/2021   Lab Results  Component Value Date   WBC 10.2 04/12/2022   HGB 13.3 04/12/2022   HCT 39.8 04/12/2022   MCV 90 04/12/2022   PLT 328 04/12/2022   Lab Results  Component Value Date   NA 139 04/12/2022   K 4.3 04/12/2022   CO2 24 04/12/2022   GLUCOSE 108 (H) 04/12/2022   BUN 8 04/12/2022   CREATININE 0.99 04/12/2022   BILITOT 0.5 04/12/2022   ALKPHOS 124 (H) 04/12/2022   AST 41 (H) 04/12/2022   ALT 47 (H) 04/12/2022   PROT 6.9 04/12/2022   ALBUMIN 4.3 04/12/2022   CALCIUM 9.2 04/12/2022   EGFR 68 04/12/2022   Lab Results  Component Value Date   CHOL 215 (H) 04/12/2022   Lab Results  Component Value Date   HDL 67 04/12/2022   Lab Results  Component Value Date   LDLCALC 132 (H) 04/12/2022   Lab Results  Component Value Date   TRIG 90 04/12/2022   Lab Results  Component Value Date   CHOLHDL 3.2 04/12/2022   Lab Results  Component Value Date   HGBA1C 6.1 (H) 04/12/2022       Assessment & Plan:  Acute non-recurrent sinusitis of other sinus Assessment & Plan: Checked rapid covid test Sent Augmentin 875 mg BID Kenalog 80 mg IM #  1  Orders: -     POC COVID-19 BinaxNow -     Amoxicillin-Pot Clavulanate; Take 1 tablet by mouth 2 (two) times daily.  Dispense: 20 tablet; Refill: 0 -     Triamcinolone Acetonide  Acute pharyngitis due to other specified organisms Assessment & Plan: Check rapid strep test.  Orders: -     POCT rapid strep A     Meds ordered this encounter  Medications   amoxicillin-clavulanate  (AUGMENTIN) 875-125 MG tablet    Sig: Take 1 tablet by mouth 2 (two) times daily.    Dispense:  20 tablet    Refill:  0   triamcinolone acetonide (KENALOG-40) injection 80 mg    Orders Placed This Encounter  Procedures   POC COVID-19 BinaxNow   POCT rapid strep A     Follow-up: No follow-ups on file.  An After Visit Summary was printed and given to the patient.   Geralynn Ochs I Leal-Borjas,acting as a scribe for Rochel Brome, MD.,have documented all relevant documentation on the behalf of Rochel Brome, MD,as directed by  Rochel Brome, MD while in the presence of Rochel Brome, MD.   I attest that I have reviewed this visit and agree with the plan scribed by my staff.   Rochel Brome, MD [facility] [phone]

## 2022-06-01 DIAGNOSIS — R051 Acute cough: Secondary | ICD-10-CM | POA: Insufficient documentation

## 2022-06-01 NOTE — Assessment & Plan Note (Signed)
Checked rapid covid test Sent Augmentin 875 mg BID Kenalog 80 mg IM #1

## 2022-06-01 NOTE — Assessment & Plan Note (Signed)
Check rapid strep test.

## 2022-06-02 LAB — POCT RAPID STREP A (OFFICE): Rapid Strep A Screen: NEGATIVE

## 2022-06-10 ENCOUNTER — Other Ambulatory Visit: Payer: Self-pay

## 2022-06-10 MED ORDER — AMPHETAMINE-DEXTROAMPHET ER 30 MG PO CP24: 30.0000 mg | ORAL_CAPSULE | ORAL | 0 refills | Status: DC

## 2022-06-12 ENCOUNTER — Other Ambulatory Visit: Payer: Self-pay | Admitting: Physician Assistant

## 2022-06-12 MED ORDER — AMPHETAMINE-DEXTROAMPHET ER 30 MG PO CP24: 30.0000 mg | ORAL_CAPSULE | ORAL | 0 refills | Status: DC

## 2022-07-01 ENCOUNTER — Other Ambulatory Visit: Payer: Self-pay | Admitting: Family Medicine

## 2022-07-01 DIAGNOSIS — M542 Cervicalgia: Secondary | ICD-10-CM

## 2022-07-02 ENCOUNTER — Other Ambulatory Visit: Payer: Self-pay | Admitting: Family Medicine

## 2022-07-02 DIAGNOSIS — M542 Cervicalgia: Secondary | ICD-10-CM

## 2022-07-03 ENCOUNTER — Other Ambulatory Visit: Payer: Self-pay

## 2022-07-03 MED ORDER — AMPHETAMINE-DEXTROAMPHET ER 30 MG PO CP24: 30.0000 mg | ORAL_CAPSULE | ORAL | 0 refills | Status: DC

## 2022-07-17 ENCOUNTER — Ambulatory Visit: Payer: BC Managed Care – PPO | Admitting: Family Medicine

## 2022-07-29 ENCOUNTER — Other Ambulatory Visit: Payer: Self-pay

## 2022-07-29 MED ORDER — AMPHETAMINE-DEXTROAMPHET ER 30 MG PO CP24: 30.0000 mg | ORAL_CAPSULE | ORAL | 0 refills | Status: DC

## 2022-07-29 MED ORDER — VALACYCLOVIR HCL 500 MG PO TABS: 500.0000 mg | ORAL_TABLET | Freq: Two times a day (BID) | ORAL | 1 refills | Status: DC

## 2022-08-01 ENCOUNTER — Other Ambulatory Visit: Payer: Self-pay

## 2022-08-01 MED ORDER — AMPHETAMINE-DEXTROAMPHET ER 30 MG PO CP24: 30.0000 mg | ORAL_CAPSULE | ORAL | 0 refills | Status: DC

## 2022-08-28 ENCOUNTER — Other Ambulatory Visit: Payer: Self-pay | Admitting: Family Medicine

## 2022-09-03 ENCOUNTER — Other Ambulatory Visit: Payer: Self-pay

## 2022-09-04 MED ORDER — AMPHETAMINE-DEXTROAMPHET ER 30 MG PO CP24: 30.0000 mg | ORAL_CAPSULE | ORAL | 0 refills | Status: DC

## 2022-09-07 ENCOUNTER — Other Ambulatory Visit: Payer: Self-pay | Admitting: Family Medicine

## 2022-09-10 NOTE — Assessment & Plan Note (Signed)
Recommend continue to work on eating healthy diet and exercise.  

## 2022-09-10 NOTE — Assessment & Plan Note (Signed)
Well controlled.  No medication changes recommended. Continue azor to 10/40 mg daily.   Continue healthy diet and exercise.   Labs drawn today

## 2022-09-10 NOTE — Progress Notes (Unsigned)
Subjective:  Patient ID: Anna [name], female    DOB: Jul 30, 1970  Age: 52 y.o. MRN: [id number]  Chief Complaint  Patient presents with   Medical Management of Chronic Issues    HPI   Hypertension: She is taking Amlodipine-Olmesartan  10/40 mg daily.  BP today:  Hyperlipidemia: Taking Rosuvastatin 20 mg daily, Lovaza 1 g one capsule twice a day.  ADHD: She takes Adderal xr 30 once daily.  Weight loss: Wegovy prescribed in 09/2021, however she could not find it at the pharmacy  Bipolar disorder: She is on Paxil CR 37.5 mg daily, vraylar 3 mg once daily, and trazodone 50 mg before bed.   PTSD: Alprazolam 0.5 mg  daily PRN for severe anxiety.  Vitamin D deficiency: taking vitamin D 50K twice weekly.  Prediabetes: A1C 6.1%     03/07/2022    3:32 PM 12/31/2021    9:18 AM 09/17/2021    8:05 PM 03/07/2021    8:43 AM  Depression screen PHQ 2/9  Decreased Interest 1 0 0 0  Down, Depressed, Hopeless 1 0 0 0  PHQ - 2 Score 2 0 0 0  Altered sleeping 1 0 0 0  Tired, decreased energy 1 1 1 1   Change in appetite 1 0 0 0  Feeling bad or failure about yourself  0 0 0 0  Trouble concentrating 0 0 0 0  Moving slowly or fidgety/restless 0 0 0 0  Suicidal thoughts 0 0 0 0  PHQ-9 Score 5 1 1 1   Difficult doing work/chores Very difficult Not difficult at all Not difficult at all Not difficult at all        05/28/2022    3:58 PM  Fall Risk   Falls in the past year? 0  Number falls in past yr: 0  Injury with Fall? 0  Risk for fall due to : No Fall Risks  Follow up Falls evaluation completed    Patient Care Team: Rosas, Fritzi Mandes, MD as PCP - General (Family Medicine)   Review of Systems  Current Outpatient Medications on File Prior to Visit  Medication Sig Dispense Refill   albuterol (VENTOLIN HFA) 108 (90 Base) MCG/ACT inhaler Inhale 2 puffs into the lungs every 6 (six) hours as needed for wheezing or shortness of breath. 18 g 1   ALPRAZolam (XANAX) 0.5 MG tablet TAKE 1 TABLET BY  MOUTH DAILY AS NEEDED FOR SEVERE ANXIETY 30 tablet 2   amLODipine-olmesartan (AZOR) 10-40 MG tablet TAKE 1 TABLET BY MOUTH EVERY DAY 90 tablet 1   amLODipine-olmesartan (AZOR) 5-20 MG tablet TAKE 1 TABLET BY MOUTH EVERY DAY 90 tablet 0   amoxicillin-clavulanate (AUGMENTIN) 875-125 MG tablet Take 1 tablet by mouth 2 (two) times daily. 20 tablet 0   amphetamine-dextroamphetamine (ADDERALL XR) 30 MG 24 hr capsule Take 1 capsule (30 mg total) by mouth every morning. 30 capsule 0   cyclobenzaprine (FLEXERIL) 5 MG tablet TAKE 1 TABLET BY MOUTH THREE TIMES A DAY AS NEEDED FOR MUSCLE SPASMS 60 tablet 1   fluticasone (FLONASE) 50 MCG/ACT nasal spray SPRAY 2 SPRAYS INTO EACH NOSTRIL EVERY DAY 48 mL 2   meloxicam (MOBIC) 7.5 MG tablet Take 2 tablets (15 mg total) by mouth daily. TAKE 1 TABLET TWICE A DAY WITH FOOD AS NEEDED 30 tablet 2   omega-3 acid ethyl esters (LOVAZA) 1 g capsule TAKE 1 CAPSULE BY MOUTH TWICE A DAY 180 capsule 0   ondansetron (ZOFRAN) 4 MG tablet Take 1 tablet (4 mg  total) by mouth every 8 (eight) hours as needed for nausea or vomiting. 20 tablet 0   PARoxetine (PAXIL-CR) 37.5 MG 24 hr tablet TAKE 1 TABLET BY MOUTH EVERY DAY 90 tablet 1   promethazine-dextromethorphan (PROMETHAZINE-DM) 6.25-15 MG/5ML syrup Take 5 mLs by mouth 4 (four) times daily as needed. 118 mL 0   rosuvastatin (CRESTOR) 20 MG tablet Take 1 tablet (20 mg total) by mouth daily. 90 tablet 1   traZODone (DESYREL) 50 MG tablet TAKE 1 TABLET BY MOUTH EVERYDAY AT BEDTIME 90 tablet 3   triamcinolone cream (KENALOG) 0.1 % Apply 1 application. topically 2 (two) times daily. 45 g 1   valACYclovir (VALTREX) 500 MG tablet Take 1 tablet (500 mg total) by mouth 2 (two) times daily. 14 tablet 1   Vitamin D, Ergocalciferol, (DRISDOL) 1.25 MG (50000 UNIT) CAPS capsule TAKE 1 CAPSULE (50,000 UNITS TOTAL) BY MOUTH TWO TIMES A WEEK 24 capsule 0   VRAYLAR 3 MG capsule Take 1 capsule (3 mg total) by mouth daily. 90 capsule 1   No current  facility-administered medications on file prior to visit.   Past Medical History:  Diagnosis Date   ADD (attention deficit disorder)    Arthritis    Asthma    seasonal   Bipolar affective disorder, current episode depressed ([facility])    GERD (gastroesophageal reflux disease)    occ   History of recurrent UTIs 03/11/2012   Hypertension    Moderate persistent asthma with exacerbation 12/16/2021   Past Surgical History:  Procedure Laterality Date   ABDOMINAL HYSTERECTOMY  2010   endometriosis. Total hysterectomy   ANTERIOR CERVICAL DECOMP/DISCECTOMY FUSION N/A 07/13/2012   Procedure: ANTERIOR CERVICAL DECOMPRESSION/DISCECTOMY FUSION 1 LEVEL;  Surgeon: Mariam Dollar, MD;  Location: [facility];  Service: Neurosurgery;  Laterality: N/A;  Anterior Cervical Decompression/Discectomy Cervical Three-Four   CARPAL TUNNEL RELEASE Right    CERVICAL DISC SURGERY  09   CHOLECYSTECTOMY      Family History  Problem Relation Age of Onset   Breast cancer Neg Hx    Social History   Socioeconomic History   Marital status: Married    Spouse name: Not on file   Number of children: Not on file   Years of education: Not on file   Highest education level: Not on file  Occupational History   Not on file  Tobacco Use   Smoking status: Never   Smokeless tobacco: Never  Vaping Use   Vaping Use: Never used  Substance and Sexual Activity   Alcohol use: No   Drug use: No    Comment: 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   Sexual activity: Not on file  Other Topics Concern   Not on file  Social History Narrative   Not on file   Social Determinants of Health   Financial Resource Strain: Low Risk  (05/28/2022)   Overall Financial Resource Strain (CARDIA)    Difficulty of Paying Living Expenses: Not hard at all  Food Insecurity: No Food Insecurity (05/28/2022)   Hunger Vital Sign    Worried About Running Out of Food in the Last Year: Never true    Ran Out of Food in the Last Year: Never true   Transportation Needs: No Transportation Needs (05/28/2022)   PRAPARE - Administrator, Civil Service (Medical): No    Lack of Transportation (Non-Medical): No  Physical Activity: Inactive (05/28/2022)   Exercise Vital Sign    Days of Exercise per Week: 0 days  Minutes of Exercise per Session: 0 min  Stress: No Stress Concern Present (05/28/2022)   [employer] of Occupational Health - Occupational Stress Questionnaire    Feeling of Stress : Not at all  Social Connections: Moderately Integrated (05/28/2022)   Social Connection and Isolation Panel [NHANES]    Frequency of Communication with Friends and Family: More than three times a week    Frequency of Social Gatherings with Friends and Family: Three times a week    Attends Religious Services: More than 4 times per year    Active Member of Clubs or Organizations: No    Attends Banker Meetings: Never    Marital Status: Married    Objective:  There were no vitals taken for this visit.     05/28/2022    3:53 PM 04/12/2022    7:40 AM 03/21/2022    3:51 PM  BP/Weight  Systolic BP 124 128 148  Diastolic BP 72 80 80  Wt. (Lbs) 239 240   BMI 42.34 kg/m2 42.51 kg/m2     Physical Exam  Diabetic Foot Exam - Simple   No data filed      Lab Results  Component Value Date   WBC 10.2 04/12/2022   HGB 13.3 04/12/2022   HCT 39.8 04/12/2022   PLT 328 04/12/2022   GLUCOSE 108 (H) 04/12/2022   CHOL 215 (H) 04/12/2022   TRIG 90 04/12/2022   HDL 67 04/12/2022   LDLCALC 132 (H) 04/12/2022   ALT 47 (H) 04/12/2022   AST 41 (H) 04/12/2022   NA 139 04/12/2022   K 4.3 04/12/2022   CL 102 04/12/2022   CREATININE 0.99 04/12/2022   BUN 8 04/12/2022   CO2 24 04/12/2022   TSH 1.000 03/29/2021   HGBA1C 6.1 (H) 04/12/2022      Assessment & Plan:    Essential hypertension, benign Assessment & Plan: Well controlled.  No medication changes recommended. Continue azor to 10/40 mg daily.   Continue healthy  diet and exercise.   Labs drawn today   Prediabetes Assessment & Plan: Recommend continue to work on eating healthy diet and exercise.    Mixed hyperlipidemia Assessment & Plan: Well controlled.  No changes to medicines.  Continue rosuvastatin 20 mg daily.  Continue Lovaza 1 g 1 capsule twice daily.  Continue to work on eating a healthy diet and exercise.  Labs drawn today.    Attention deficit hyperactivity disorder (ADHD), combined type Assessment & Plan: Continue Adderall XR 20 mg every morning.       No orders of the defined types were placed in this encounter.   No orders of the defined types were placed in this encounter.    Follow-up: No follow-ups on file.   I,Marla I Leal-Borjas,acting as a scribe for Blane Ohara, MD.,have documented all relevant documentation on the behalf of Blane Ohara, MD,as directed by  Blane Ohara, MD while in the presence of Blane Ohara, MD.   An After Visit Summary was printed and given to the patient.  Blane Ohara, MD [facility] [phone]

## 2022-09-10 NOTE — Assessment & Plan Note (Signed)
Well controlled.  No changes to medicines.  Continue rosuvastatin 20 mg daily.  Continue Lovaza 1 g 1 capsule twice daily.  Continue to work on eating a healthy diet and exercise.  Labs drawn today.   

## 2022-09-10 NOTE — Assessment & Plan Note (Signed)
Continue Adderall XR 30 mg every morning.

## 2022-09-11 ENCOUNTER — Encounter: Payer: Self-pay | Admitting: Family Medicine

## 2022-09-11 ENCOUNTER — Ambulatory Visit (INDEPENDENT_AMBULATORY_CARE_PROVIDER_SITE_OTHER): Payer: BC Managed Care – PPO | Admitting: Family Medicine

## 2022-09-11 VITALS — BP 118/82 | HR 82 | Temp 97.2°F | Resp 16 | Ht 63.0 in | Wt 240.0 lb

## 2022-09-11 DIAGNOSIS — Z1231 Encounter for screening mammogram for malignant neoplasm of breast: Secondary | ICD-10-CM

## 2022-09-11 DIAGNOSIS — R7303 Prediabetes: Secondary | ICD-10-CM

## 2022-09-11 DIAGNOSIS — F3131 Bipolar disorder, current episode depressed, mild: Secondary | ICD-10-CM

## 2022-09-11 DIAGNOSIS — F902 Attention-deficit hyperactivity disorder, combined type: Secondary | ICD-10-CM

## 2022-09-11 DIAGNOSIS — I10 Essential (primary) hypertension: Secondary | ICD-10-CM | POA: Diagnosis not present

## 2022-09-11 DIAGNOSIS — Z23 Encounter for immunization: Secondary | ICD-10-CM

## 2022-09-11 DIAGNOSIS — E782 Mixed hyperlipidemia: Secondary | ICD-10-CM

## 2022-09-11 DIAGNOSIS — Z1211 Encounter for screening for malignant neoplasm of colon: Secondary | ICD-10-CM

## 2022-09-11 LAB — LIPID PANEL
Chol/HDL Ratio: 2.4 ratio (ref 0.0–4.4)
Cholesterol, Total: 149 mg/dL (ref 100–199)
HDL: 61 mg/dL (ref >39)
LDL Chol Calc (NIH): 75 mg/dL (ref 0–99)
Triglycerides: 67 mg/dL (ref 0–149)
VLDL Cholesterol Cal: 13 mg/dL (ref 5–40)

## 2022-09-11 LAB — COMPREHENSIVE METABOLIC PANEL
ALT: 27 IU/L (ref 0–32)
AST: 32 IU/L (ref 0–40)
Albumin: 4.5 g/dL (ref 3.8–4.9)
Alkaline Phosphatase: 91 IU/L (ref 44–121)
BUN/Creatinine Ratio: 6 — ABNORMAL LOW (ref 9–23)
BUN: 6 mg/dL (ref 6–24)
Bilirubin Total: 0.7 mg/dL (ref 0.0–1.2)
CO2: 22 mmol/L (ref 20–29)
Calcium: 9.6 mg/dL (ref 8.7–10.2)
Chloride: 99 mmol/L (ref 96–106)
Creatinine, Ser: 1.08 mg/dL — ABNORMAL HIGH (ref 0.57–1.00)
Globulin, Total: 3 g/dL (ref 1.5–4.5)
Glucose: 120 mg/dL — ABNORMAL HIGH (ref 70–99)
Potassium: 4.3 mmol/L (ref 3.5–5.2)
Sodium: 136 mmol/L (ref 134–144)
Total Protein: 7.5 g/dL (ref 6.0–8.5)
eGFR: 61 mL/min/{1.73_m2} (ref >59)

## 2022-09-11 LAB — CBC WITH DIFFERENTIAL/PLATELET
Basophils Absolute: 0.1 10*3/uL (ref 0.0–0.2)
Basos: 1 %
EOS (ABSOLUTE): 0.1 10*3/uL (ref 0.0–0.4)
Eos: 2 %
Hematocrit: 41.2 % (ref 34.0–46.6)
Hemoglobin: 13.8 g/dL (ref 11.1–15.9)
Immature Grans (Abs): 0.1 10*3/uL (ref 0.0–0.1)
Immature Granulocytes: 1 %
Lymphocytes Absolute: 3.4 10*3/uL — ABNORMAL HIGH (ref 0.7–3.1)
Lymphs: 40 %
MCH: 29.9 pg (ref 26.6–33.0)
MCHC: 33.5 g/dL (ref 31.5–35.7)
MCV: 89 fL (ref 79–97)
Monocytes Absolute: 0.8 10*3/uL (ref 0.1–0.9)
Monocytes: 9 %
Neutrophils Absolute: 3.9 10*3/uL (ref 1.4–7.0)
Neutrophils: 47 %
Platelets: 317 10*3/uL (ref 150–450)
RBC: 4.61 x10E6/uL (ref 3.77–5.28)
RDW: 12.7 % (ref 11.7–15.4)
WBC: 8.4 10*3/uL (ref 3.4–10.8)

## 2022-09-11 LAB — HEMOGLOBIN A1C
Est. average glucose Bld gHb Est-mCnc: 120 mg/dL
Hgb A1c MFr Bld: 5.8 % — ABNORMAL HIGH (ref 4.8–5.6)

## 2022-09-11 MED ORDER — ALPRAZOLAM 0.5 MG PO TABS
ORAL_TABLET | ORAL | 2 refills | Status: DC
Start: 2022-09-11 — End: 2022-12-24

## 2022-09-11 NOTE — Assessment & Plan Note (Signed)
The current medical regimen is effective;  continue present plan and medications.  Vraylar 3 mg daily, Paxil 37.5 mg daily and Trazodone 50 mg at bedtime. 

## 2022-09-18 ENCOUNTER — Ambulatory Visit
Admission: RE | Admit: 2022-09-18 | Discharge: 2022-09-18 | Disposition: A | Discharge status: Home / Self Care | Payer: BC Managed Care – PPO | Source: Ambulatory Visit | Attending: Family Medicine | Admitting: Family Medicine

## 2022-09-18 DIAGNOSIS — Z1231 Encounter for screening mammogram for malignant neoplasm of breast: Secondary | ICD-10-CM

## 2022-09-18 NOTE — Progress Notes (Unsigned)
Acute Office Visit  Subjective:    Patient ID: Anna [name], female    DOB: 11-18-1970, 52 y.o.   MRN: [id number]  No chief complaint on file.   HPI: Upper respiratory symptoms She complains of {uri sx's' brief:15453}.with {systemic_sx:15294}. Onset of symptoms was {onset initial:119223} and {progression:119226}.She {hydration history:15378}.  Past history is significant for {respiratory illness:412}. Patient is {smoker?:15292}   Past Medical History:  Diagnosis Date   ADD (attention deficit disorder)    Arthritis    Asthma    seasonal   Bipolar affective disorder, current episode depressed (HCC)    GERD (gastroesophageal reflux disease)    occ   History of recurrent UTIs 03/11/2012   Hypertension    Moderate persistent asthma with exacerbation 12/16/2021    Past Surgical History:  Procedure Laterality Date   ABDOMINAL HYSTERECTOMY  2010   endometriosis. Total hysterectomy   ANTERIOR CERVICAL DECOMP/DISCECTOMY FUSION N/A 07/13/2012   Procedure: ANTERIOR CERVICAL DECOMPRESSION/DISCECTOMY FUSION 1 LEVEL;  Surgeon: Mariam Dollar, MD;  Location: [facility];  Service: Neurosurgery;  Laterality: N/A;  Anterior Cervical Decompression/Discectomy Cervical Three-Four   CARPAL TUNNEL RELEASE Right    CERVICAL DISC SURGERY  09   CHOLECYSTECTOMY      Family History  Problem Relation Age of Onset   Breast cancer Neg Hx     Social History   Socioeconomic History   Marital status: Married    Spouse name: Not on file   Number of children: Not on file   Years of education: Not on file   Highest education level: Not on file  Occupational History   Not on file  Tobacco Use   Smoking status: Never   Smokeless tobacco: Never  Vaping Use   Vaping Use: Never used  Substance and Sexual Activity   Alcohol use: No   Drug use: No    Comment: 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   Sexual activity: Not on file  Other Topics Concern   Not on file  Social History Narrative   Not on  file   Social Determinants of Health   Financial Resource Strain: Low Risk  (05/28/2022)   Overall Financial Resource Strain (CARDIA)    Difficulty of Paying Living Expenses: Not hard at all  Food Insecurity: No Food Insecurity (05/28/2022)   Hunger Vital Sign    Worried About Running Out of Food in the Last Year: Never true    Ran Out of Food in the Last Year: Never true  Transportation Needs: No Transportation Needs (05/28/2022)   PRAPARE - Administrator, Civil Service (Medical): No    Lack of Transportation (Non-Medical): No  Physical Activity: Inactive (09/11/2022)   Exercise Vital Sign    Days of Exercise per Week: 0 days    Minutes of Exercise per Session: 0 min  Stress: No Stress Concern Present (05/28/2022)   [employer] of Occupational Health - Occupational Stress Questionnaire    Feeling of Stress : Not at all  Social Connections: Moderately Integrated (05/28/2022)   Social Connection and Isolation Panel [NHANES]    Frequency of Communication with Friends and Family: More than three times a week    Frequency of Social Gatherings with Friends and Family: Three times a week    Attends Religious Services: More than 4 times per year    Active Member of Clubs or Organizations: No    Attends Banker Meetings: Never    Marital Status: Married  Catering manager  Violence: Not At Risk (05/28/2022)   Humiliation, Afraid, Rape, and Kick questionnaire    Fear of Current or Ex-Partner: No    Emotionally Abused: No    Physically Abused: No    Sexually Abused: No    Outpatient Medications Prior to Visit  Medication Sig Dispense Refill   albuterol (VENTOLIN HFA) 108 (90 Base) MCG/ACT inhaler Inhale 2 puffs into the lungs every 6 (six) hours as needed for wheezing or shortness of breath. 18 g 1   ALPRAZolam (XANAX) 0.5 MG tablet TAKE 1 TABLET BY MOUTH DAILY AS NEEDED FOR SEVERE ANXIETY 30 tablet 2   amLODipine-olmesartan (AZOR) 10-40 MG tablet TAKE 1 TABLET BY  MOUTH EVERY DAY 90 tablet 1   amphetamine-dextroamphetamine (ADDERALL XR) 30 MG 24 hr capsule Take 1 capsule (30 mg total) by mouth every morning. 30 capsule 0   cyclobenzaprine (FLEXERIL) 5 MG tablet TAKE 1 TABLET BY MOUTH THREE TIMES A DAY AS NEEDED FOR MUSCLE SPASMS 60 tablet 1   fluticasone (FLONASE) 50 MCG/ACT nasal spray SPRAY 2 SPRAYS INTO EACH NOSTRIL EVERY DAY 48 mL 2   omega-3 acid ethyl esters (LOVAZA) 1 g capsule TAKE 1 CAPSULE BY MOUTH TWICE A DAY 180 capsule 0   ondansetron (ZOFRAN) 4 MG tablet Take 1 tablet (4 mg total) by mouth every 8 (eight) hours as needed for nausea or vomiting. 20 tablet 0   PARoxetine (PAXIL-CR) 37.5 MG 24 hr tablet TAKE 1 TABLET BY MOUTH EVERY DAY 90 tablet 1   rosuvastatin (CRESTOR) 20 MG tablet Take 1 tablet (20 mg total) by mouth daily. 90 tablet 1   traZODone (DESYREL) 50 MG tablet TAKE 1 TABLET BY MOUTH EVERYDAY AT BEDTIME 90 tablet 3   triamcinolone cream (KENALOG) 0.1 % Apply 1 application. topically 2 (two) times daily. 45 g 1   valACYclovir (VALTREX) 500 MG tablet Take 1 tablet (500 mg total) by mouth 2 (two) times daily. 14 tablet 1   Vitamin D, Ergocalciferol, (DRISDOL) 1.25 MG (50000 UNIT) CAPS capsule TAKE 1 CAPSULE (50,000 UNITS TOTAL) BY MOUTH TWO TIMES A WEEK 24 capsule 0   VRAYLAR 3 MG capsule Take 1 capsule (3 mg total) by mouth daily. 90 capsule 1   No facility-administered medications prior to visit.    Allergies  Allergen Reactions   Avelox [Moxifloxacin Hcl In Nacl] Anaphylaxis   Lactose Intolerance (Gi) Diarrhea   Tape Other (See Comments)    If tape on a while will pull off skin    Review of Systems     Objective:        09/11/2022    7:49 AM 05/28/2022    3:53 PM 04/12/2022    7:40 AM  Vitals with BMI  Height 5\' 3"  5\' 3"  5\' 3"   Weight 240 lbs 239 lbs 240 lbs  BMI 42.52 42.35 42.52  Systolic 118 124 161  Diastolic 82 72 80  Pulse 82 88 92    No data found.   Physical Exam  Health Maintenance Due  Topic Date  Due   Fecal DNA (Cologuard)  Never done   Zoster Vaccines- Shingrix (1 of 2) Never done   MAMMOGRAM  09/05/2022    There are no preventive care reminders to display for this patient.   Lab Results  Component Value Date   TSH 1.000 03/29/2021   Lab Results  Component Value Date   WBC 8.4 09/11/2022   HGB 13.8 09/11/2022   HCT 41.2 09/11/2022   MCV 89 09/11/2022  PLT 317 09/11/2022   Lab Results  Component Value Date   NA 136 09/11/2022   K 4.3 09/11/2022   CO2 22 09/11/2022   GLUCOSE 120 (H) 09/11/2022   BUN 6 09/11/2022   CREATININE 1.08 (H) 09/11/2022   BILITOT 0.7 09/11/2022   ALKPHOS 91 09/11/2022   AST 32 09/11/2022   ALT 27 09/11/2022   PROT 7.5 09/11/2022   ALBUMIN 4.5 09/11/2022   CALCIUM 9.6 09/11/2022   EGFR 61 09/11/2022   Lab Results  Component Value Date   CHOL 149 09/11/2022   Lab Results  Component Value Date   HDL 61 09/11/2022   Lab Results  Component Value Date   LDLCALC 75 09/11/2022   Lab Results  Component Value Date   TRIG 67 09/11/2022   Lab Results  Component Value Date   CHOLHDL 2.4 09/11/2022   Lab Results  Component Value Date   HGBA1C 5.8 (H) 09/11/2022       Assessment & Plan:  There are no diagnoses linked to this encounter.   No orders of the defined types were placed in this encounter.   No orders of the defined types were placed in this encounter.    Follow-up: No follow-ups on file.  An After Visit Summary was printed and given to the patient.  Langley Gauss, [facility] [phone]

## 2022-09-19 ENCOUNTER — Encounter: Payer: Self-pay | Admitting: Physician Assistant

## 2022-09-19 ENCOUNTER — Ambulatory Visit: Payer: BC Managed Care – PPO | Admitting: Physician Assistant

## 2022-09-19 VITALS — BP 104/80 | HR 77 | Temp 97.3°F | Ht 63.0 in | Wt 244.2 lb

## 2022-09-19 DIAGNOSIS — J0100 Acute maxillary sinusitis, unspecified: Secondary | ICD-10-CM | POA: Diagnosis not present

## 2022-09-19 MED ORDER — TRIAMCINOLONE ACETONIDE 40 MG/ML IJ SUSP
80.0000 mg | Freq: Once | INTRAMUSCULAR | Status: AC
Start: 2022-09-19 — End: 2022-09-19
  Administered 2022-09-19: 80 mg via INTRAMUSCULAR

## 2022-09-19 MED ORDER — AMOXICILLIN 875 MG PO TABS
875.0000 mg | ORAL_TABLET | Freq: Two times a day (BID) | ORAL | 0 refills | Status: AC
Start: 2022-09-19 — End: 2022-09-29

## 2022-09-19 NOTE — Assessment & Plan Note (Addendum)
Prescribed Amoxicillin 875mg  Given Kenalog 80mg  IM Will call in 8-10 days if she does not improve

## 2022-09-30 ENCOUNTER — Other Ambulatory Visit: Payer: Self-pay | Admitting: Family Medicine

## 2022-09-30 ENCOUNTER — Ambulatory Visit: Payer: BC Managed Care – PPO | Admitting: Family Medicine

## 2022-09-30 ENCOUNTER — Encounter: Payer: Self-pay | Admitting: Family Medicine

## 2022-09-30 VITALS — BP 120/70 | HR 84 | Temp 97.1°F | Resp 16 | Ht 63.5 in | Wt 247.0 lb

## 2022-09-30 DIAGNOSIS — L918 Other hypertrophic disorders of the skin: Secondary | ICD-10-CM | POA: Diagnosis not present

## 2022-09-30 MED ORDER — AMPHETAMINE-DEXTROAMPHET ER 30 MG PO CP24: 30.0000 mg | ORAL_CAPSULE | ORAL | 0 refills | Status: DC

## 2022-09-30 MED ORDER — ROSUVASTATIN CALCIUM 20 MG PO TABS: 20.0000 mg | ORAL_TABLET | Freq: Every day | ORAL | 1 refills | Status: DC

## 2022-09-30 NOTE — Assessment & Plan Note (Addendum)
Procedure: Skin tags cleansed with alcohol.  Anesthesia: ethyl chloride Excision of 2 skin tags.  No complications.

## 2022-09-30 NOTE — Progress Notes (Signed)
Acute Office Visit  Subjective:    Patient ID: Anna [name], female    DOB: September 16, 1970, 52 y.o.   MRN: [id number]  Chief Complaint  Patient presents with   skins tags    HPI: Patient is in today for skin tag removal.    Past Medical History:  Diagnosis Date   ADD (attention deficit disorder)    Arthritis    Asthma    seasonal   Bipolar affective disorder, current episode depressed (HCC)    GERD (gastroesophageal reflux disease)    occ   History of recurrent UTIs 03/11/2012   Hypertension    Moderate persistent asthma with exacerbation 12/16/2021    Past Surgical History:  Procedure Laterality Date   ABDOMINAL HYSTERECTOMY  2010   endometriosis. Total hysterectomy   ANTERIOR CERVICAL DECOMP/DISCECTOMY FUSION N/A 07/13/2012   Procedure: ANTERIOR CERVICAL DECOMPRESSION/DISCECTOMY FUSION 1 LEVEL;  Surgeon: Mariam Dollar, MD;  Location: [facility];  Service: Neurosurgery;  Laterality: N/A;  Anterior Cervical Decompression/Discectomy Cervical Three-Four   CARPAL TUNNEL RELEASE Right    CERVICAL DISC SURGERY  09   CHOLECYSTECTOMY      Family History  Problem Relation Age of Onset   Breast cancer Neg Hx     Social History   Socioeconomic History   Marital status: Married    Spouse name: Not on file   Number of children: Not on file   Years of education: Not on file   Highest education level: Not on file  Occupational History   Not on file  Tobacco Use   Smoking status: Never   Smokeless tobacco: Never  Vaping Use   Vaping status: Never Used  Substance and Sexual Activity   Alcohol use: No   Drug use: No    Comment: 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   Sexual activity: Not on file  Other Topics Concern   Not on file  Social History Narrative   Not on file   Social Determinants of Health   Financial Resource Strain: Low Risk  (05/28/2022)   Overall Financial Resource Strain (CARDIA)    Difficulty of Paying Living Expenses: Not hard at all  Food  Insecurity: No Food Insecurity (05/28/2022)   Hunger Vital Sign    Worried About Running Out of Food in the Last Year: Never true    Ran Out of Food in the Last Year: Never true  Transportation Needs: No Transportation Needs (05/28/2022)   PRAPARE - Administrator, Civil Service (Medical): No    Lack of Transportation (Non-Medical): No  Physical Activity: Inactive (09/11/2022)   Exercise Vital Sign    Days of Exercise per Week: 0 days    Minutes of Exercise per Session: 0 min  Stress: No Stress Concern Present (05/28/2022)   [employer] of Occupational Health - Occupational Stress Questionnaire    Feeling of Stress : Not at all  Social Connections: Moderately Integrated (05/28/2022)   Social Connection and Isolation Panel [NHANES]    Frequency of Communication with Friends and Family: More than three times a week    Frequency of Social Gatherings with Friends and Family: Three times a week    Attends Religious Services: More than 4 times per year    Active Member of Clubs or Organizations: No    Attends Banker Meetings: Never    Marital Status: Married  Catering manager Violence: Not At Risk (05/28/2022)   Humiliation, Afraid, Rape, and Kick questionnaire  Fear of Current or Ex-Partner: No    Emotionally Abused: No    Physically Abused: No    Sexually Abused: No    Outpatient Medications Prior to Visit  Medication Sig Dispense Refill   albuterol (VENTOLIN HFA) 108 (90 Base) MCG/ACT inhaler Inhale 2 puffs into the lungs every 6 (six) hours as needed for wheezing or shortness of breath. 18 g 1   ALPRAZolam (XANAX) 0.5 MG tablet TAKE 1 TABLET BY MOUTH DAILY AS NEEDED FOR SEVERE ANXIETY 30 tablet 2   amLODipine-olmesartan (AZOR) 10-40 MG tablet TAKE 1 TABLET BY MOUTH EVERY DAY 90 tablet 1   cyclobenzaprine (FLEXERIL) 5 MG tablet TAKE 1 TABLET BY MOUTH THREE TIMES A DAY AS NEEDED FOR MUSCLE SPASMS 60 tablet 1   fluticasone (FLONASE) 50 MCG/ACT nasal spray  SPRAY 2 SPRAYS INTO EACH NOSTRIL EVERY DAY 48 mL 2   omega-3 acid ethyl esters (LOVAZA) 1 g capsule TAKE 1 CAPSULE BY MOUTH TWICE A DAY 180 capsule 0   ondansetron (ZOFRAN) 4 MG tablet Take 1 tablet (4 mg total) by mouth every 8 (eight) hours as needed for nausea or vomiting. 20 tablet 0   PARoxetine (PAXIL-CR) 37.5 MG 24 hr tablet TAKE 1 TABLET BY MOUTH EVERY DAY 90 tablet 1   traZODone (DESYREL) 50 MG tablet TAKE 1 TABLET BY MOUTH EVERYDAY AT BEDTIME 90 tablet 3   triamcinolone cream (KENALOG) 0.1 % Apply 1 application. topically 2 (two) times daily. 45 g 1   valACYclovir (VALTREX) 500 MG tablet Take 1 tablet (500 mg total) by mouth 2 (two) times daily. 14 tablet 1   Vitamin D, Ergocalciferol, (DRISDOL) 1.25 MG (50000 UNIT) CAPS capsule TAKE 1 CAPSULE (50,000 UNITS TOTAL) BY MOUTH TWO TIMES A WEEK 24 capsule 0   VRAYLAR 3 MG capsule Take 1 capsule (3 mg total) by mouth daily. 90 capsule 1   amphetamine-dextroamphetamine (ADDERALL XR) 30 MG 24 hr capsule Take 1 capsule (30 mg total) by mouth every morning. 30 capsule 0   rosuvastatin (CRESTOR) 20 MG tablet Take 1 tablet (20 mg total) by mouth daily. 90 tablet 1   No facility-administered medications prior to visit.    Allergies  Allergen Reactions   Avelox [Moxifloxacin Hcl In Nacl] Anaphylaxis   Lactose Intolerance (Gi) Diarrhea   Tape Other (See Comments)    If tape on a while will pull off skin    Review of Systems  Constitutional:  Negative for chills, fatigue and fever.  HENT:  Negative for congestion, rhinorrhea and sore throat.   Respiratory:  Negative for cough and shortness of breath.   Cardiovascular:  Negative for chest pain.  Gastrointestinal:  Negative for abdominal pain, constipation, diarrhea, nausea and vomiting.  Genitourinary:  Negative for dysuria and urgency.  Musculoskeletal:  Negative for back pain and myalgias.  Neurological:  Negative for dizziness, weakness, light-headedness and headaches.   Psychiatric/Behavioral:  Negative for dysphoric mood. The patient is not nervous/anxious.        Objective:        09/30/2022    3:07 PM 09/19/2022    1:35 PM 09/11/2022    7:49 AM  Vitals with BMI  Height 5' 3.5" 5\' 3"  5\' 3"   Weight 247 lbs 244 lbs 3 oz 240 lbs  BMI 43.06 43.27 42.52  Systolic 120 104 161  Diastolic 70 80 82  Pulse 84 77 82    No data found.   Physical Exam Vitals reviewed.  Skin:    Findings: Lesion  present.          Health Maintenance Due  Topic Date Due   Fecal DNA (Cologuard)  Never done   Zoster Vaccines- Shingrix (1 of 2) Never done    There are no preventive care reminders to display for this patient.   Lab Results  Component Value Date   TSH 1.000 03/29/2021   Lab Results  Component Value Date   WBC 8.4 09/11/2022   HGB 13.8 09/11/2022   HCT 41.2 09/11/2022   MCV 89 09/11/2022   PLT 317 09/11/2022   Lab Results  Component Value Date   NA 136 09/11/2022   K 4.3 09/11/2022   CO2 22 09/11/2022   GLUCOSE 120 (H) 09/11/2022   BUN 6 09/11/2022   CREATININE 1.08 (H) 09/11/2022   BILITOT 0.7 09/11/2022   ALKPHOS 91 09/11/2022   AST 32 09/11/2022   ALT 27 09/11/2022   PROT 7.5 09/11/2022   ALBUMIN 4.5 09/11/2022   CALCIUM 9.6 09/11/2022   EGFR 61 09/11/2022   Lab Results  Component Value Date   CHOL 149 09/11/2022   Lab Results  Component Value Date   HDL 61 09/11/2022   Lab Results  Component Value Date   LDLCALC 75 09/11/2022   Lab Results  Component Value Date   TRIG 67 09/11/2022   Lab Results  Component Value Date   CHOLHDL 2.4 09/11/2022   Lab Results  Component Value Date   HGBA1C 5.8 (H) 09/11/2022       Assessment & Plan:  Skin tags, multiple acquired Assessment & Plan: Procedure: Skin tags cleansed with alcohol.  Anesthesia: ethyl chloride Excision of 2 skin tags.  No complications.    Other orders -     Amphetamine-Dextroamphet ER; Take 1 capsule (30 mg total) by mouth every morning.   Dispense: 30 capsule; Refill: 0 -     Rosuvastatin Calcium; Take 1 tablet (20 mg total) by mouth daily.  Dispense: 90 tablet; Refill: 1     Meds ordered this encounter  Medications   amphetamine-dextroamphetamine (ADDERALL XR) 30 MG 24 hr capsule    Sig: Take 1 capsule (30 mg total) by mouth every morning.    Dispense:  30 capsule    Refill:  0    Cancel vyvanse since on back order.   rosuvastatin (CRESTOR) 20 MG tablet    Sig: Take 1 tablet (20 mg total) by mouth daily.    Dispense:  90 tablet    Refill:  1    No orders of the defined types were placed in this encounter.    Follow-up: No follow-ups on file.  An After Visit Summary was printed and given to the patient.  Blane Ohara, MD [facility] [phone]

## 2022-10-01 ENCOUNTER — Other Ambulatory Visit: Payer: Self-pay | Admitting: Family Medicine

## 2022-10-04 ENCOUNTER — Other Ambulatory Visit: Payer: Self-pay | Admitting: Family Medicine

## 2022-10-15 ENCOUNTER — Ambulatory Visit: Payer: BC Managed Care – PPO | Admitting: Family Medicine

## 2022-10-15 ENCOUNTER — Encounter: Payer: Self-pay | Admitting: Family Medicine

## 2022-10-15 VITALS — BP 120/76 | HR 78 | Temp 97.3°F | Ht 63.0 in | Wt 240.0 lb

## 2022-10-15 DIAGNOSIS — R509 Fever, unspecified: Secondary | ICD-10-CM

## 2022-10-15 DIAGNOSIS — J208 Acute bronchitis due to other specified organisms: Secondary | ICD-10-CM | POA: Diagnosis not present

## 2022-10-15 MED ORDER — DOXYCYCLINE HYCLATE 100 MG PO TABS: 100.0000 mg | ORAL_TABLET | Freq: Two times a day (BID) | ORAL | 0 refills | Status: DC

## 2022-10-15 NOTE — Progress Notes (Unsigned)
Acute Office Visit  Subjective:    Patient ID: Anna [name], female    DOB: [birth year], 52 y.o.   MRN: [id number]  Chief Complaint  Patient presents with  . URI    HPI: Patient is in today for cough, congestion and fever which started last Thursday. Highest her fever was 102. Has been taking 3 ibuprofen (200 mg). C/o body aches and chills, fatigue, decreased appetite. Denies facial pain or pressure, nausea.  Past Medical History:  Diagnosis Date  . ADD (attention deficit disorder)   . Arthritis   . Asthma    seasonal  . Bipolar affective disorder, current episode depressed (HCC)   . GERD (gastroesophageal reflux disease)    occ  . History of recurrent UTIs 03/11/2012  . Hypertension   . Moderate persistent asthma with exacerbation 12/16/2021    Past Surgical History:  Procedure Laterality Date  . ABDOMINAL HYSTERECTOMY  2010   endometriosis. Total hysterectomy  . ANTERIOR CERVICAL DECOMP/DISCECTOMY FUSION N/A 07/13/2012   Procedure: ANTERIOR CERVICAL DECOMPRESSION/DISCECTOMY FUSION 1 LEVEL;  Surgeon: Mariam Dollar, MD;  Location: [facility];  Service: Neurosurgery;  Laterality: N/A;  Anterior Cervical Decompression/Discectomy Cervical Three-Four  . CARPAL TUNNEL RELEASE Right   . CERVICAL DISC SURGERY  09  . CHOLECYSTECTOMY      Family History  Problem Relation Age of Onset  . Breast cancer Neg Hx     Social History   Socioeconomic History  . Marital status: Married    Spouse name: Not on file  . Number of children: Not on file  . Years of education: Not on file  . Highest education level: Not on file  Occupational History  . Not on file  Tobacco Use  . Smoking status: Never  . Smokeless tobacco: Never  Vaping Use  . Vaping status: Never Used  Substance and Sexual Activity  . Alcohol use: No  . 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 Sexual activity: Not on file  Other Topics Concern  . Not on file  Social History Narrative  .  Not on file   Social Determinants of Health   Financial Resource Strain: Low Risk  (05/28/2022)   Overall Financial Resource Strain (CARDIA)   . Difficulty of Paying Living Expenses: Not hard at all  Food Insecurity: No Food Insecurity (05/28/2022)   Hunger Vital Sign   . Worried About Programme researcher, broadcasting/film/video in the Last Year: Never true   . Ran Out of Food in the Last Year: Never true  Transportation Needs: No Transportation Needs (05/28/2022)   PRAPARE - Transportation   . Lack of Transportation (Medical): No   . Lack of Transportation (Non-Medical): No  Physical Activity: Inactive (09/11/2022)   Exercise Vital Sign   . Days of Exercise per Week: 0 days   . Minutes of Exercise per Session: 0 min  Stress: No Stress Concern Present (05/28/2022)   [employer] of Occupational Health - Occupational Stress Questionnaire   . Feeling of Stress : Not at all  Social Connections: Moderately Integrated (05/28/2022)   Social Connection and Isolation Panel [NHANES]   . Frequency of Communication with Friends and Family: More than three times a week   . Frequency of Social Gatherings with Friends and Family: Three times a week   . Attends Religious Services: More than 4 times per year   . Active Member of Clubs or Organizations: No   . Attends Club or  Organization Meetings: Never   . Marital Status: Married  Catering manager Violence: Not At Risk (05/28/2022)   Humiliation, Afraid, Rape, and Kick questionnaire   . Fear of Current or Ex-Partner: No   . Emotionally Abused: No   . Physically Abused: No   . Sexually Abused: No    Outpatient Medications Prior to Visit  Medication Sig Dispense Refill  . albuterol (VENTOLIN HFA) 108 (90 Base) MCG/ACT inhaler Inhale 2 puffs into the lungs every 6 (six) hours as needed for wheezing or shortness of breath. 18 g 1  . ALPRAZolam (XANAX) 0.5 MG tablet TAKE 1 TABLET BY MOUTH DAILY AS NEEDED FOR SEVERE ANXIETY 30 tablet 2  . amLODipine-olmesartan (AZOR)  10-40 MG tablet TAKE 1 TABLET BY MOUTH EVERY DAY 90 tablet 1  . amphetamine-dextroamphetamine (ADDERALL XR) 30 MG 24 hr capsule Take 1 capsule (30 mg total) by mouth every morning. 30 capsule 0  . cyclobenzaprine (FLEXERIL) 5 MG tablet TAKE 1 TABLET BY MOUTH THREE TIMES A DAY AS NEEDED FOR MUSCLE SPASMS 60 tablet 1  . fluticasone (FLONASE) 50 MCG/ACT nasal spray SPRAY 2 SPRAYS INTO EACH NOSTRIL EVERY DAY 48 mL 2  . omega-3 acid ethyl esters (LOVAZA) 1 g capsule TAKE 1 CAPSULE BY MOUTH TWICE A DAY 180 capsule 0  . ondansetron (ZOFRAN) 4 MG tablet Take 1 tablet (4 mg total) by mouth every 8 (eight) hours as needed for nausea or vomiting. 20 tablet 0  . PARoxetine (PAXIL-CR) 37.5 MG 24 hr tablet TAKE 1 TABLET BY MOUTH EVERY DAY 90 tablet 1  . rosuvastatin (CRESTOR) 20 MG tablet Take 1 tablet (20 mg total) by mouth daily. 90 tablet 1  . traZODone (DESYREL) 50 MG tablet TAKE 1 TABLET BY MOUTH EVERYDAY AT BEDTIME 90 tablet 3  . triamcinolone cream (KENALOG) 0.1 % Apply 1 application. topically 2 (two) times daily. 45 g 1  . valACYclovir (VALTREX) 500 MG tablet Take 1 tablet (500 mg total) by mouth 2 (two) times daily. 14 tablet 1  . Vitamin D, Ergocalciferol, (DRISDOL) 1.25 MG (50000 UNIT) CAPS capsule TAKE 1 CAPSULE (50,000 UNITS TOTAL) BY MOUTH TWO TIMES A WEEK 24 capsule 0  . VRAYLAR 3 MG capsule Take 1 capsule (3 mg total) by mouth daily. 90 capsule 1   No facility-administered medications prior to visit.    Allergies  Allergen Reactions  . Avelox [Moxifloxacin Hcl In Nacl] Anaphylaxis  . Lactose Intolerance (Gi) Diarrhea  . Tape Other (See Comments)    If tape on a while will pull off skin    Review of Systems  Constitutional:  Positive for chills, fatigue and fever.  HENT:  Positive for congestion and rhinorrhea. Negative for ear pain, postnasal drip, sinus pressure, sinus pain and sore throat.   Respiratory:  Positive for cough. Negative for shortness of breath.   Cardiovascular:   Negative for chest pain.  Gastrointestinal:  Negative for diarrhea and nausea.  Neurological:  Positive for headaches. Negative for dizziness.       Objective:        09/30/2022    3:07 PM 09/19/2022    1:35 PM 09/11/2022    7:49 AM  Vitals with BMI  Height 5' 3.5" 5\' 3"  5\' 3"   Weight 247 lbs 244 lbs 3 oz 240 lbs  BMI 43.06 43.27 42.52  Systolic 120 104 409  Diastolic 70 80 82  Pulse 84 77 82    No data found.   Physical Exam  Health Maintenance Due  Topic Date Due  . Zoster Vaccines- Shingrix (1 of 2) Never done  . Fecal DNA (Cologuard)  Never done  . INFLUENZA VACCINE  10/10/2022    There are no preventive care reminders to display for this patient.   Lab Results  Component Value Date   TSH 1.000 03/29/2021   Lab Results  Component Value Date   WBC 8.4 09/11/2022   HGB 13.8 09/11/2022   HCT 41.2 09/11/2022   MCV 89 09/11/2022   PLT 317 09/11/2022   Lab Results  Component Value Date   NA 136 09/11/2022   K 4.3 09/11/2022   CO2 22 09/11/2022   GLUCOSE 120 (H) 09/11/2022   BUN 6 09/11/2022   CREATININE 1.08 (H) 09/11/2022   BILITOT 0.7 09/11/2022   ALKPHOS 91 09/11/2022   AST 32 09/11/2022   ALT 27 09/11/2022   PROT 7.5 09/11/2022   ALBUMIN 4.5 09/11/2022   CALCIUM 9.6 09/11/2022   EGFR 61 09/11/2022   Lab Results  Component Value Date   CHOL 149 09/11/2022   Lab Results  Component Value Date   HDL 61 09/11/2022   Lab Results  Component Value Date   LDLCALC 75 09/11/2022   Lab Results  Component Value Date   TRIG 67 09/11/2022   Lab Results  Component Value Date   CHOLHDL 2.4 09/11/2022   Lab Results  Component Value Date   HGBA1C 5.8 (H) 09/11/2022       Assessment & Plan:  There are no diagnoses linked to this encounter.   No orders of the defined types were placed in this encounter.   No orders of the defined types were placed in this encounter.    Follow-up: No follow-ups on file.  An After Visit Summary was printed  and given to the patient.  Blane Ohara, MD [facility] [phone]

## 2022-10-17 DIAGNOSIS — R509 Fever, unspecified: Secondary | ICD-10-CM | POA: Insufficient documentation

## 2022-10-17 NOTE — Assessment & Plan Note (Signed)
Due to length of illness thus far minimal improvement will give prescription for doxycycline.  I explained this may be all viral infection even though it her COVID and flu are negative.  Recommend OTC cough medicine, Tylenol

## 2022-10-17 NOTE — Assessment & Plan Note (Signed)
Unclear etiology however patient does have some pulmonary symptoms.  Concerning for bronchitis versus possibly an early pneumonia

## 2022-10-24 ENCOUNTER — Other Ambulatory Visit: Payer: Self-pay | Admitting: Family Medicine

## 2022-10-28 ENCOUNTER — Other Ambulatory Visit: Payer: Self-pay

## 2022-10-28 MED ORDER — AMPHETAMINE-DEXTROAMPHET ER 30 MG PO CP24: 30.0000 mg | ORAL_CAPSULE | ORAL | 0 refills | Status: DC

## 2022-10-28 NOTE — Telephone Encounter (Signed)
PATIENT IS GOING OUT OF TOWN AND WOULD LIKE TO PICK UP HER ADDERAL ON THURSDAY.

## 2022-10-30 ENCOUNTER — Other Ambulatory Visit: Payer: Self-pay | Admitting: Family Medicine

## 2022-10-30 MED ORDER — AMPHETAMINE-DEXTROAMPHET ER 30 MG PO CP24: 30.0000 mg | ORAL_CAPSULE | ORAL | 0 refills | Status: DC

## 2022-10-31 ENCOUNTER — Other Ambulatory Visit: Payer: Self-pay | Admitting: Family Medicine

## 2022-11-22 ENCOUNTER — Other Ambulatory Visit: Payer: Self-pay | Admitting: Family Medicine

## 2022-11-22 DIAGNOSIS — M542 Cervicalgia: Secondary | ICD-10-CM

## 2022-11-25 ENCOUNTER — Other Ambulatory Visit: Payer: Self-pay

## 2022-11-25 DIAGNOSIS — J328 Other chronic sinusitis: Secondary | ICD-10-CM

## 2022-11-25 MED ORDER — FLUTICASONE PROPIONATE 50 MCG/ACT NA SUSP
2.0000 | Freq: Every day | NASAL | 2 refills | Status: AC
Start: 2022-11-25 — End: ?

## 2022-11-27 ENCOUNTER — Encounter: Payer: Self-pay | Admitting: Family Medicine

## 2022-11-27 ENCOUNTER — Ambulatory Visit: Payer: BC Managed Care – PPO | Admitting: Family Medicine

## 2022-11-27 VITALS — BP 120/84 | HR 88 | Temp 98.3°F | Ht 63.0 in | Wt 240.0 lb

## 2022-11-27 DIAGNOSIS — J4521 Mild intermittent asthma with (acute) exacerbation: Secondary | ICD-10-CM

## 2022-11-27 DIAGNOSIS — J0101 Acute recurrent maxillary sinusitis: Secondary | ICD-10-CM | POA: Diagnosis not present

## 2022-11-27 DIAGNOSIS — R051 Acute cough: Secondary | ICD-10-CM

## 2022-11-27 LAB — POC COVID19 BINAXNOW: SARS Coronavirus 2 Ag: NEGATIVE

## 2022-11-27 MED ORDER — PREDNISONE 20 MG PO TABS
20.0000 mg | ORAL_TABLET | Freq: Two times a day (BID) | ORAL | 0 refills | Status: AC
Start: 2022-11-27 — End: 2022-12-02

## 2022-11-27 MED ORDER — TRIAMCINOLONE ACETONIDE 40 MG/ML IJ SUSP
80.0000 mg | Freq: Once | INTRAMUSCULAR | Status: AC
Start: 2022-11-27 — End: 2022-11-27
  Administered 2022-11-27: 80 mg via INTRAMUSCULAR

## 2022-11-27 MED ORDER — AIRSUPRA 90-80 MCG/ACT IN AERO
90.0000 ug | INHALATION_SPRAY | Freq: Four times a day (QID) | RESPIRATORY_TRACT | 1 refills | Status: DC | PRN
Start: 2022-11-27 — End: 2023-10-09

## 2022-11-27 MED ORDER — AMOXICILLIN-POT CLAVULANATE 875-125 MG PO TABS: 1.0000 | ORAL_TABLET | Freq: Two times a day (BID) | ORAL | 0 refills | Status: DC

## 2022-11-27 NOTE — Progress Notes (Signed)
Acute Office Visit  Subjective:    Patient ID: Anna [name], female    DOB: March 24, 1970, 52 y.o.   MRN: [id number]  Chief Complaint  Patient presents with   URI    HPI: Acute recurrent maxillary sinusitis: Patient is in today for URI symptoms. Symptoms started Saturday with only a cough. Cough is productive, sore from coughing complains of also having a headache, sore throat, sinus pain and pressure and congestion. Stabbing pain in her forehead when she bends forward. Some nausea but no diarrhea. Some shob. Has taken Robitussin, Ibuprofen, Mucinex and claritin which has not helped. Denies chest pain, fever, or body aches.   Mild intermittent asthma with acute exacerbation: Patient explains that she used all her albuterol inhaler when she was sick in July and needs a refill. She was using it every 6 hours as needed for wheezing and shortness of breath.  Past Medical History:  Diagnosis Date   ADD (attention deficit disorder)    Arthritis    Asthma    seasonal   Bipolar affective disorder, current episode depressed (HCC)    GERD (gastroesophageal reflux disease)    occ   History of recurrent UTIs 03/11/2012   Hypertension    Moderate persistent asthma with exacerbation 12/16/2021    Past Surgical History:  Procedure Laterality Date   ABDOMINAL HYSTERECTOMY  2010   endometriosis. Total hysterectomy   ANTERIOR CERVICAL DECOMP/DISCECTOMY FUSION N/A 07/13/2012   Procedure: ANTERIOR CERVICAL DECOMPRESSION/DISCECTOMY FUSION 1 LEVEL;  Surgeon: Mariam Dollar, MD;  Location: [facility];  Service: Neurosurgery;  Laterality: N/A;  Anterior Cervical Decompression/Discectomy Cervical Three-Four   CARPAL TUNNEL RELEASE Right    CERVICAL DISC SURGERY  09   CHOLECYSTECTOMY      Family History  Problem Relation Age of Onset   Breast cancer Neg Hx     Social History   Socioeconomic History   Marital status: Married    Spouse name: Not on file   Number of children: Not on file   Years of  education: Not on file   Highest education level: Not on file  Occupational History   Not on file  Tobacco Use   Smoking status: Never   Smokeless tobacco: Never  Vaping Use   Vaping status: Never Used  Substance and Sexual Activity   Alcohol use: No   Drug use: No    Comment: 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   Sexual activity: Not on file  Other Topics Concern   Not on file  Social History Narrative   Not on file   Social Determinants of Health   Financial Resource Strain: Low Risk  (05/28/2022)   Overall Financial Resource Strain (CARDIA)    Difficulty of Paying Living Expenses: Not hard at all  Food Insecurity: No Food Insecurity (05/28/2022)   Hunger Vital Sign    Worried About Running Out of Food in the Last Year: Never true    Ran Out of Food in the Last Year: Never true  Transportation Needs: No Transportation Needs (05/28/2022)   PRAPARE - Administrator, Civil Service (Medical): No    Lack of Transportation (Non-Medical): No  Physical Activity: Inactive (09/11/2022)   Exercise Vital Sign    Days of Exercise per Week: 0 days    Minutes of Exercise per Session: 0 min  Stress: No Stress Concern Present (05/28/2022)   [employer] of Occupational Health - Occupational Stress Questionnaire    Feeling of Stress : Not  at all  Social Connections: Moderately Integrated (05/28/2022)   Social Connection and Isolation Panel [NHANES]    Frequency of Communication with Friends and Family: More than three times a week    Frequency of Social Gatherings with Friends and Family: Three times a week    Attends Religious Services: More than 4 times per year    Active Member of Clubs or Organizations: No    Attends Banker Meetings: Never    Marital Status: Married  Catering manager Violence: Not At Risk (05/28/2022)   Humiliation, Afraid, Rape, and Kick questionnaire    Fear of Current or Ex-Partner: No    Emotionally Abused: No    Physically Abused: No     Sexually Abused: No    Outpatient Medications Prior to Visit  Medication Sig Dispense Refill   albuterol (VENTOLIN HFA) 108 (90 Base) MCG/ACT inhaler Inhale 2 puffs into the lungs every 6 (six) hours as needed for wheezing or shortness of breath. 18 g 1   ALPRAZolam (XANAX) 0.5 MG tablet TAKE 1 TABLET BY MOUTH DAILY AS NEEDED FOR SEVERE ANXIETY 30 tablet 2   amLODipine-olmesartan (AZOR) 10-40 MG tablet TAKE 1 TABLET BY MOUTH EVERY DAY 90 tablet 1   amphetamine-dextroamphetamine (ADDERALL XR) 30 MG 24 hr capsule Take 1 capsule (30 mg total) by mouth every morning. 30 capsule 0   cyclobenzaprine (FLEXERIL) 5 MG tablet TAKE 1 TABLET BY MOUTH THREE TIMES A DAY AS NEEDED FOR MUSCLE SPASM 60 tablet 1   doxycycline (VIBRA-TABS) 100 MG tablet Take 1 tablet (100 mg total) by mouth 2 (two) times daily. 20 tablet 0   fluticasone (FLONASE) 50 MCG/ACT nasal spray Place 2 sprays into both nostrils daily. 18 mL 2   omega-3 acid ethyl esters (LOVAZA) 1 g capsule TAKE 1 CAPSULE BY MOUTH TWICE A DAY 180 capsule 0   ondansetron (ZOFRAN) 4 MG tablet Take 1 tablet (4 mg total) by mouth every 8 (eight) hours as needed for nausea or vomiting. 20 tablet 0   PARoxetine (PAXIL-CR) 37.5 MG 24 hr tablet TAKE 1 TABLET BY MOUTH EVERY DAY 90 tablet 1   rosuvastatin (CRESTOR) 20 MG tablet Take 1 tablet (20 mg total) by mouth daily. 90 tablet 1   traZODone (DESYREL) 50 MG tablet TAKE 1 TABLET BY MOUTH EVERYDAY AT BEDTIME 90 tablet 3   triamcinolone cream (KENALOG) 0.1 % Apply 1 application. topically 2 (two) times daily. 45 g 1   valACYclovir (VALTREX) 500 MG tablet TAKE 1 TABLET BY MOUTH TWICE A DAY 14 tablet 1   Vitamin D, Ergocalciferol, (DRISDOL) 1.25 MG (50000 UNIT) CAPS capsule TAKE 1 CAPSULE (50,000 UNITS TOTAL) BY MOUTH TWO TIMES A WEEK 24 capsule 0   VRAYLAR 3 MG capsule Take 1 capsule (3 mg total) by mouth daily. 90 capsule 1   No facility-administered medications prior to visit.    Allergies  Allergen  Reactions   Avelox [Moxifloxacin Hcl In Nacl] Anaphylaxis   Lactose Intolerance (Gi) Diarrhea   Tape Other (See Comments)    If tape on a while will pull off skin    Review of Systems  Constitutional:  Positive for fatigue. Negative for chills and fever.  HENT:  Positive for congestion, rhinorrhea, sinus pressure, sinus pain and sore throat. Negative for ear pain and postnasal drip.   Respiratory:  Positive for cough and shortness of breath.   Cardiovascular:  Negative for chest pain.  Gastrointestinal:  Positive for nausea. Negative for diarrhea.  Neurological:  Positive for headaches. Negative for dizziness.       Objective:        11/27/2022   10:46 AM 10/15/2022    2:27 PM 09/30/2022    3:07 PM  Vitals with BMI  Height 5\' 3"  5\' 3"  5' 3.5"  Weight 240 lbs 240 lbs 247 lbs  BMI 42.52 42.52 43.06  Systolic 120 120 272  Diastolic 84 76 70  Pulse 88 78 84    No data found.   Physical Exam Constitutional:      General: She is not in acute distress.    Appearance: Normal appearance.  HENT:     Right Ear: Tympanic membrane is not erythematous.     Left Ear: Tympanic membrane is not erythematous.     Nose: Congestion present.     Right Turbinates: Swollen.     Left Turbinates: Swollen.     Right Sinus: Maxillary sinus tenderness and frontal sinus tenderness present.     Left Sinus: Maxillary sinus tenderness and frontal sinus tenderness present.     Mouth/Throat:     Lips: Pink.     Mouth: Mucous membranes are moist.     Pharynx: No oropharyngeal exudate.  Cardiovascular:     Rate and Rhythm: Normal rate and regular rhythm.     Heart sounds: Normal heart sounds.  Pulmonary:     Effort: Pulmonary effort is normal.     Breath sounds: Wheezing present.  Skin:    General: Skin is warm.  Neurological:     Mental Status: She is alert. Mental status is at baseline.  Psychiatric:        Mood and Affect: Mood normal.     Health Maintenance Due  Topic Date Due    Zoster Vaccines- Shingrix (1 of 2) Never done   Fecal DNA (Cologuard)  Never done   INFLUENZA VACCINE  10/10/2022    There are no preventive care reminders to display for this patient.   Lab Results  Component Value Date   TSH 1.000 03/29/2021   Lab Results  Component Value Date   WBC 8.4 09/11/2022   HGB 13.8 09/11/2022   HCT 41.2 09/11/2022   MCV 89 09/11/2022   PLT 317 09/11/2022   Lab Results  Component Value Date   NA 136 09/11/2022   K 4.3 09/11/2022   CO2 22 09/11/2022   GLUCOSE 120 (H) 09/11/2022   BUN 6 09/11/2022   CREATININE 1.08 (H) 09/11/2022   BILITOT 0.7 09/11/2022   ALKPHOS 91 09/11/2022   AST 32 09/11/2022   ALT 27 09/11/2022   PROT 7.5 09/11/2022   ALBUMIN 4.5 09/11/2022   CALCIUM 9.6 09/11/2022   EGFR 61 09/11/2022   Lab Results  Component Value Date   CHOL 149 09/11/2022   Lab Results  Component Value Date   HDL 61 09/11/2022   Lab Results  Component Value Date   LDLCALC 75 09/11/2022   Lab Results  Component Value Date   TRIG 67 09/11/2022   Lab Results  Component Value Date   CHOLHDL 2.4 09/11/2022   Lab Results  Component Value Date   HGBA1C 5.8 (H) 09/11/2022       Assessment & Plan:  Acute cough Assessment & Plan: Covid test negative Take antibiotics as prescribed Use Flonase and nasal spray daily Try honey and lemon as needed for cough Drink plenty of fluids FU as needed  Orders: -     POC COVID-19 BinaxNow  Acute recurrent maxillary  sinusitis Assessment & Plan: Recurrent Prescribed Augmentin 1 tablet by mouth TWICE A DAY  Given Kenolog 80 mg IM in office Patient to call if symptoms fail to improve or get worse.  Orders: -     Amoxicillin-Pot Clavulanate; Take 1 tablet by mouth 2 (two) times daily.  Dispense: 20 tablet; Refill: 0 -     Triamcinolone Acetonide  Mild intermittent asthma with acute exacerbation Assessment & Plan: Exacerbation due to allergies Airsupra sample given and prescribed to use  for rescue every 6 hours as needed Prednisone 20 mg by mouth TWICE A DAY for 5 days.   Orders: -     Airsupra; Inhale 90 mcg into the lungs every 6 (six) hours as needed for up to 28 doses.  Dispense: 5.9 g; Refill: 1 -     predniSONE; Take 1 tablet (20 mg total) by mouth 2 (two) times daily with a meal for 5 days.  Dispense: 10 tablet; Refill: 0     Meds ordered this encounter  Medications   amoxicillin-clavulanate (AUGMENTIN) 875-125 MG tablet    Sig: Take 1 tablet by mouth 2 (two) times daily.    Dispense:  20 tablet    Refill:  0   Albuterol-Budesonide (AIRSUPRA) 90-80 MCG/ACT AERO    Sig: Inhale 90 mcg into the lungs every 6 (six) hours as needed for up to 28 doses.    Dispense:  5.9 g    Refill:  1   triamcinolone acetonide (KENALOG-40) injection 80 mg   predniSONE (DELTASONE) 20 MG tablet    Sig: Take 1 tablet (20 mg total) by mouth 2 (two) times daily with a meal for 5 days.    Dispense:  10 tablet    Refill:  0    Orders Placed This Encounter  Procedures   POC COVID-19     Follow-up: Return if symptoms worsen or fail to improve.  An After Visit Summary was printed and given to the patient.  Total time spent on today's visit was greater than 30 minutes, including both face-to-face time and nonface-to-face time personally spent on review of chart (labs and imaging), discussing labs and goals, discussing further work-up, treatment options, referrals to specialist if needed, reviewing outside records if pertinent, answering patient's questions, and coordinating care.   Renne Crigler, FNP [facility] [phone]

## 2022-11-27 NOTE — Assessment & Plan Note (Signed)
Exacerbation due to allergies Airsupra sample given and prescribed to use for rescue every 6 hours as needed Prednisone 20 mg by mouth TWICE A DAY for 5 days.

## 2022-11-27 NOTE — Assessment & Plan Note (Signed)
Recurrent Prescribed Augmentin 1 tablet by mouth TWICE A DAY  Given Kenolog 80 mg IM in office Patient to call if symptoms fail to improve or get worse.

## 2022-11-27 NOTE — Assessment & Plan Note (Signed)
Covid test negative Take antibiotics as prescribed Use Flonase and nasal spray daily Try honey and lemon as needed for cough Drink plenty of fluids FU as needed

## 2022-11-29 ENCOUNTER — Other Ambulatory Visit: Payer: Self-pay

## 2022-12-02 MED ORDER — AMPHETAMINE-DEXTROAMPHET ER 30 MG PO CP24: 30.0000 mg | ORAL_CAPSULE | ORAL | 0 refills | Status: DC

## 2022-12-19 ENCOUNTER — Other Ambulatory Visit: Payer: Self-pay | Admitting: Family Medicine

## 2022-12-24 ENCOUNTER — Other Ambulatory Visit: Payer: Self-pay

## 2022-12-24 DIAGNOSIS — F3131 Bipolar disorder, current episode depressed, mild: Secondary | ICD-10-CM

## 2022-12-24 MED ORDER — AMPHETAMINE-DEXTROAMPHET ER 30 MG PO CP24: 30.0000 mg | ORAL_CAPSULE | ORAL | 0 refills | Status: DC

## 2022-12-24 MED ORDER — ALPRAZOLAM 0.5 MG PO TABS
ORAL_TABLET | ORAL | 2 refills | Status: DC
Start: 2022-12-24 — End: 2023-04-15

## 2023-01-01 NOTE — Assessment & Plan Note (Signed)
Well controlled.  No changes to medicines.  Continue rosuvastatin 20 mg daily.  Continue Lovaza 1 g 1 capsule twice daily.  Continue to work on eating a healthy diet and exercise.  Labs drawn today.  

## 2023-01-01 NOTE — Progress Notes (Unsigned)
Subjective:  Patient ID: Anna [name], female    DOB: 06-10-1970  Age: 52 y.o. MRN: [id number]  Chief Complaint  Patient presents with   Medical Management of Chronic Issues    HPI Hypertension: She is taking Amlodipine-Olmesartan  10/40 mg daily.  BP today:  Hyperlipidemia: Taking Rosuvastatin 20 mg daily, Lovaza 1 g one capsule twice a day.  ADHD: She takes Adderal xr 30 once daily.  Bipolar disorder: She is on Paxil CR 37.5 mg daily, vraylar 3 mg once daily, and trazodone 50 mg before bed.   PTSD: Alprazolam 0.5 mg  daily PRN for severe anxiety.  Vitamin D deficiency: taking vitamin D 50K twice weekly.   Prediabetes: A1C 5.8%.  Healthy diet and less, as well exercising. Walking around school 3 times.      09/11/2022    7:51 AM 03/07/2022    3:32 PM 12/31/2021    9:18 AM 09/17/2021    8:05 PM 03/07/2021    8:43 AM  Depression screen PHQ 2/9  Decreased Interest 0 1 0 0 0  Down, Depressed, Hopeless 0 1 0 0 0  PHQ - 2 Score 0 2 0 0 0  Altered sleeping 0 1 0 0 0  Tired, decreased energy 0 1 1 1 1   Change in appetite 0 1 0 0 0  Feeling bad or failure about yourself  0 0 0 0 0  Trouble concentrating 0 0 0 0 0  Moving slowly or fidgety/restless 0 0 0 0 0  Suicidal thoughts 0 0 0 0 0  PHQ-9 Score 0 5 1 1 1   Difficult doing work/chores Not difficult at all Very difficult Not difficult at all Not difficult at all Not difficult at all        01/02/2023    7:46 AM  Fall Risk   Falls in the past year? 0  Number falls in past yr: 0  Injury with Fall? 0  Risk for fall due to : No Fall Risks  Follow up Falls evaluation completed    Patient Care Team: Blane Ohara, MD as PCP - General (Family Medicine)   Review of Systems  Constitutional:  Negative for chills, fatigue and fever.  HENT:  Negative for congestion, ear pain and sore throat.   Respiratory:  Negative for cough and shortness of breath.   Cardiovascular:  Negative for chest pain.  Gastrointestinal:  Negative  for abdominal pain, constipation, diarrhea, nausea and vomiting.  Genitourinary:  Negative for dysuria and urgency.  Musculoskeletal:  Negative for arthralgias and myalgias.  Skin:  Negative for rash.  Neurological:  Negative for dizziness and headaches.  Psychiatric/Behavioral:  Negative for dysphoric mood. The patient is not nervous/anxious.     Current Outpatient Medications on File Prior to Visit  Medication Sig Dispense Refill   albuterol (VENTOLIN HFA) 108 (90 Base) MCG/ACT inhaler Inhale 2 puffs into the lungs every 6 (six) hours as needed for wheezing or shortness of breath. 18 g 1   Albuterol-Budesonide (AIRSUPRA) 90-80 MCG/ACT AERO Inhale 90 mcg into the lungs every 6 (six) hours as needed for up to 28 doses. 5.9 g 1   ALPRAZolam (XANAX) 0.5 MG tablet TAKE 1 TABLET BY MOUTH DAILY AS NEEDED FOR SEVERE ANXIETY 30 tablet 2   amLODipine-olmesartan (AZOR) 10-40 MG tablet TAKE 1 TABLET BY MOUTH EVERY DAY 90 tablet 1   amphetamine-dextroamphetamine (ADDERALL XR) 30 MG 24 hr capsule Take 1 capsule (30 mg total) by mouth every morning. 30 capsule 0  doxycycline (VIBRA-TABS) 100 MG tablet Take 1 tablet (100 mg total) by mouth 2 (two) times daily. 20 tablet 0   fluticasone (FLONASE) 50 MCG/ACT nasal spray Place 2 sprays into both nostrils daily. 18 mL 2   omega-3 acid ethyl esters (LOVAZA) 1 g capsule TAKE 1 CAPSULE BY MOUTH TWICE A DAY 180 capsule 0   ondansetron (ZOFRAN) 4 MG tablet Take 1 tablet (4 mg total) by mouth every 8 (eight) hours as needed for nausea or vomiting. 20 tablet 0   PARoxetine (PAXIL-CR) 37.5 MG 24 hr tablet TAKE 1 TABLET BY MOUTH EVERY DAY 90 tablet 1   rosuvastatin (CRESTOR) 20 MG tablet Take 1 tablet (20 mg total) by mouth daily. 90 tablet 1   traZODone (DESYREL) 50 MG tablet TAKE 1 TABLET BY MOUTH EVERYDAY AT BEDTIME 90 tablet 3   triamcinolone cream (KENALOG) 0.1 % Apply 1 application. topically 2 (two) times daily. 45 g 1   valACYclovir (VALTREX) 500 MG tablet TAKE  1 TABLET BY MOUTH TWICE A DAY 14 tablet 1   Vitamin D, Ergocalciferol, (DRISDOL) 1.25 MG (50000 UNIT) CAPS capsule TAKE 1 CAPSULE (50,000 UNITS TOTAL) BY MOUTH TWO TIMES A WEEK 24 capsule 0   VRAYLAR 3 MG capsule Take 1 capsule (3 mg total) by mouth daily. 90 capsule 1   No current facility-administered medications on file prior to visit.   Past Medical History:  Diagnosis Date   ADD (attention deficit disorder)    Arthritis    Asthma    seasonal   Bipolar affective disorder, current episode depressed (HCC)    GERD (gastroesophageal reflux disease)    occ   History of recurrent UTIs 03/11/2012   Hypertension    Moderate persistent asthma with exacerbation 12/16/2021   Past Surgical History:  Procedure Laterality Date   ABDOMINAL HYSTERECTOMY  2010   endometriosis. Total hysterectomy   ANTERIOR CERVICAL DECOMP/DISCECTOMY FUSION N/A 07/13/2012   Procedure: ANTERIOR CERVICAL DECOMPRESSION/DISCECTOMY FUSION 1 LEVEL;  Surgeon: Mariam Dollar, MD;  Location: [facility];  Service: Neurosurgery;  Laterality: N/A;  Anterior Cervical Decompression/Discectomy Cervical Three-Four   CARPAL TUNNEL RELEASE Right    CERVICAL DISC SURGERY  09   CHOLECYSTECTOMY      Family History  Problem Relation Age of Onset   Breast cancer Neg Hx    Social History   Socioeconomic History   Marital status: Married    Spouse name: Not on file   Number of children: Not on file   Years of education: Not on file   Highest education level: Not on file  Occupational History   Not on file  Tobacco Use   Smoking status: Never   Smokeless tobacco: Never  Vaping Use   Vaping status: Never Used  Substance and Sexual Activity   Alcohol use: No   Drug use: No    Comment: 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   Sexual activity: Not on file  Other Topics Concern   Not on file  Social History Narrative   Not on file   Social Determinants of Health   Financial Resource Strain: Low Risk  (05/28/2022)   Overall  Financial Resource Strain (CARDIA)    Difficulty of Paying Living Expenses: Not hard at all  Food Insecurity: No Food Insecurity (05/28/2022)   Hunger Vital Sign    Worried About Running Out of Food in the Last Year: Never true    Ran Out of Food in the Last Year: Never true  Transportation Needs:  No Transportation Needs (05/28/2022)   PRAPARE - Administrator, Civil Service (Medical): No    Lack of Transportation (Non-Medical): No  Physical Activity: Inactive (09/11/2022)   Exercise Vital Sign    Days of Exercise per Week: 0 days    Minutes of Exercise per Session: 0 min  Stress: No Stress Concern Present (05/28/2022)   [employer] of Occupational Health - Occupational Stress Questionnaire    Feeling of Stress : Not at all  Social Connections: Moderately Integrated (05/28/2022)   Social Connection and Isolation Panel [NHANES]    Frequency of Communication with Friends and Family: More than three times a week    Frequency of Social Gatherings with Friends and Family: Three times a week    Attends Religious Services: More than 4 times per year    Active Member of Clubs or Organizations: No    Attends Banker Meetings: Never    Marital Status: Married    Objective:  BP 118/86   Pulse (!) 113   Temp (!) 97.5 F (36.4 C)   Ht 5\' 3"  (1.6 m)   Wt 244 lb (110.7 kg)   SpO2 97%   BMI 43.22 kg/m      01/02/2023    7:44 AM 11/27/2022   10:46 AM 10/15/2022    2:27 PM  BP/Weight  Systolic BP 118 120 120  Diastolic BP 86 84 76  Wt. (Lbs) 244 240 240  BMI 43.22 kg/m2 42.51 kg/m2 42.51 kg/m2    Physical Exam Vitals reviewed.  Constitutional:      Appearance: Normal appearance. She is obese.  Neck:     Vascular: No carotid bruit.  Cardiovascular:     Rate and Rhythm: Normal rate and regular rhythm.     Heart sounds: Normal heart sounds.  Pulmonary:     Effort: Pulmonary effort is normal. No respiratory distress.     Breath sounds: Normal breath sounds.   Abdominal:     General: Abdomen is flat. Bowel sounds are normal.     Palpations: Abdomen is soft.     Tenderness: There is no abdominal tenderness.  Neurological:     Mental Status: She is alert and oriented to person, place, and time.  Psychiatric:        Mood and Affect: Mood normal.        Behavior: Behavior normal.     Diabetic Foot Exam - Simple   No data filed      Lab Results  Component Value Date   WBC 8.4 09/11/2022   HGB 13.8 09/11/2022   HCT 41.2 09/11/2022   PLT 317 09/11/2022   GLUCOSE 120 (H) 09/11/2022   CHOL 149 09/11/2022   TRIG 67 09/11/2022   HDL 61 09/11/2022   LDLCALC 75 09/11/2022   ALT 27 09/11/2022   AST 32 09/11/2022   NA 136 09/11/2022   K 4.3 09/11/2022   CL 99 09/11/2022   CREATININE 1.08 (H) 09/11/2022   BUN 6 09/11/2022   CO2 22 09/11/2022   TSH 1.000 03/29/2021   HGBA1C 5.8 (H) 09/11/2022      Assessment & Plan:    Essential hypertension, benign Assessment & Plan: Well controlled.  No medication changes recommended. Continue azor to 10/40 mg daily.   Continue healthy diet and exercise.   Labs drawn today  Orders: -     CBC with Differential/Platelet -     Comprehensive metabolic panel  Gastroesophageal reflux disease without esophagitis Assessment &  Plan: OTC omeprazole as needed    Attention deficit hyperactivity disorder (ADHD), combined type Assessment & Plan: Continue Adderall XR 30 mg every morning.    Mixed hyperlipidemia Assessment & Plan: Well controlled.  No changes to medicines.  Continue rosuvastatin 20 mg daily.  Continue Lovaza 1 g 1 capsule twice daily.  Continue to work on eating a healthy diet and exercise.  Labs drawn today.   Orders: -     Lipid panel  Prediabetes Assessment & Plan: Recommend continue to work on eating healthy diet and exercise.   Orders: -     Hemoglobin A1c  Neck pain -     Cyclobenzaprine HCl; Take 1 tablet (5 mg total) by mouth 2 (two) times daily as needed for  muscle spasms.  Dispense: 180 tablet; Refill: 0  Colon cancer screening -     Cologuard  Bipolar 1 disorder, depressed, mild (HCC) Assessment & Plan: The current medical regimen is effective;  continue present plan and medications.  Vraylar 3 mg daily, Paxil 37.5 mg daily and Trazodone 50 mg at bedtime.   Class 3 severe obesity due to excess calories with serious comorbidity and body mass index (BMI) of 40.0 to 44.9 in adult [facility]) Assessment & Plan: Recommend continue to work on eating healthy diet and exercise.    Encounter for immunization -     Influenza, MDCK, trivalent, PF(Flucelvax egg-free)     Meds ordered this encounter  Medications   cyclobenzaprine (FLEXERIL) 5 MG tablet    Sig: Take 1 tablet (5 mg total) by mouth 2 (two) times daily as needed for muscle spasms.    Dispense:  180 tablet    Refill:  0    Orders Placed This Encounter  Procedures   Influenza, MDCK, trivalent, PF(Flucelvax egg-free)   CBC with Differential/Platelet   Comprehensive metabolic panel   Lipid panel   Hemoglobin A1c   Cologuard     Follow-up: Return in about 3 months (around 04/04/2023) for chronic follow up.   I,Marla I Leal-Borjas,acting as a scribe for Blane Ohara, MD.,have documented all relevant documentation on the behalf of Blane Ohara, MD,as directed by  Blane Ohara, MD while in the presence of Blane Ohara, MD.   An After Visit Summary was printed and given to the patient.  Blane Ohara, MD [facility] [phone]

## 2023-01-01 NOTE — Assessment & Plan Note (Signed)
Continue Adderall XR 30 mg every morning.

## 2023-01-01 NOTE — Assessment & Plan Note (Signed)
Well controlled.  No medication changes recommended. Continue azor to 10/40 mg daily.   Continue healthy diet and exercise.   Labs drawn today

## 2023-01-02 ENCOUNTER — Encounter: Payer: Self-pay | Admitting: Family Medicine

## 2023-01-02 ENCOUNTER — Ambulatory Visit (INDEPENDENT_AMBULATORY_CARE_PROVIDER_SITE_OTHER): Payer: BC Managed Care – PPO | Admitting: Family Medicine

## 2023-01-02 VITALS — BP 118/86 | HR 113 | Temp 97.5°F | Ht 63.0 in | Wt 244.0 lb

## 2023-01-02 DIAGNOSIS — K219 Gastro-esophageal reflux disease without esophagitis: Secondary | ICD-10-CM | POA: Diagnosis not present

## 2023-01-02 DIAGNOSIS — F3131 Bipolar disorder, current episode depressed, mild: Secondary | ICD-10-CM

## 2023-01-02 DIAGNOSIS — E66813 Obesity, class 3: Secondary | ICD-10-CM | POA: Insufficient documentation

## 2023-01-02 DIAGNOSIS — F902 Attention-deficit hyperactivity disorder, combined type: Secondary | ICD-10-CM

## 2023-01-02 DIAGNOSIS — Z1211 Encounter for screening for malignant neoplasm of colon: Secondary | ICD-10-CM | POA: Insufficient documentation

## 2023-01-02 DIAGNOSIS — E782 Mixed hyperlipidemia: Secondary | ICD-10-CM

## 2023-01-02 DIAGNOSIS — Z23 Encounter for immunization: Secondary | ICD-10-CM

## 2023-01-02 DIAGNOSIS — M542 Cervicalgia: Secondary | ICD-10-CM

## 2023-01-02 DIAGNOSIS — R7303 Prediabetes: Secondary | ICD-10-CM

## 2023-01-02 DIAGNOSIS — I10 Essential (primary) hypertension: Secondary | ICD-10-CM | POA: Diagnosis not present

## 2023-01-02 DIAGNOSIS — Z6841 Body Mass Index (BMI) 40.0 and over, adult: Secondary | ICD-10-CM

## 2023-01-02 MED ORDER — CYCLOBENZAPRINE HCL 5 MG PO TABS
5.0000 mg | ORAL_TABLET | Freq: Two times a day (BID) | ORAL | 0 refills | Status: DC | PRN
Start: 2023-01-02 — End: 2023-07-07

## 2023-01-02 NOTE — Assessment & Plan Note (Signed)
OTC omeprazole as needed

## 2023-01-02 NOTE — Assessment & Plan Note (Signed)
The current medical regimen is effective;  continue present plan and medications.  Vraylar 3 mg daily, Paxil 37.5 mg daily and Trazodone 50 mg at bedtime. 

## 2023-01-02 NOTE — Assessment & Plan Note (Signed)
Recommend continue to work on eating healthy diet and exercise.  

## 2023-01-03 LAB — CBC WITH DIFFERENTIAL/PLATELET
Basophils Absolute: 0.1 10*3/uL (ref 0.0–0.2)
Basos: 1 %
EOS (ABSOLUTE): 0.1 10*3/uL (ref 0.0–0.4)
Eos: 1 %
Hematocrit: 40.1 % (ref 34.0–46.6)
Hemoglobin: 12.9 g/dL (ref 11.1–15.9)
Immature Grans (Abs): 0.1 10*3/uL (ref 0.0–0.1)
Immature Granulocytes: 1 %
Lymphocytes Absolute: 3.9 10*3/uL — ABNORMAL HIGH (ref 0.7–3.1)
Lymphs: 38 %
MCH: 29.9 pg (ref 26.6–33.0)
MCHC: 32.2 g/dL (ref 31.5–35.7)
MCV: 93 fL (ref 79–97)
Monocytes Absolute: 0.8 10*3/uL (ref 0.1–0.9)
Monocytes: 8 %
Neutrophils Absolute: 5.3 10*3/uL (ref 1.4–7.0)
Neutrophils: 51 %
Platelets: 299 10*3/uL (ref 150–450)
RBC: 4.31 x10E6/uL (ref 3.77–5.28)
RDW: 12.8 % (ref 11.7–15.4)
WBC: 10.3 10*3/uL (ref 3.4–10.8)

## 2023-01-03 LAB — COMPREHENSIVE METABOLIC PANEL
ALT: 24 [IU]/L (ref 0–32)
AST: 25 [IU]/L (ref 0–40)
Albumin: 4.5 g/dL (ref 3.8–4.9)
Alkaline Phosphatase: 91 [IU]/L (ref 44–121)
BUN/Creatinine Ratio: 14 (ref 9–23)
BUN: 13 mg/dL (ref 6–24)
Bilirubin Total: 0.6 mg/dL (ref 0.0–1.2)
CO2: 24 mmol/L (ref 20–29)
Calcium: 9.6 mg/dL (ref 8.7–10.2)
Chloride: 100 mmol/L (ref 96–106)
Creatinine, Ser: 0.96 mg/dL (ref 0.57–1.00)
Globulin, Total: 2.7 g/dL (ref 1.5–4.5)
Glucose: 93 mg/dL (ref 70–99)
Potassium: 4 mmol/L (ref 3.5–5.2)
Sodium: 141 mmol/L (ref 134–144)
Total Protein: 7.2 g/dL (ref 6.0–8.5)
eGFR: 70 mL/min/{1.73_m2} (ref >59)

## 2023-01-03 LAB — LIPID PANEL
Chol/HDL Ratio: 2.6 ratio (ref 0.0–4.4)
Cholesterol, Total: 193 mg/dL (ref 100–199)
HDL: 74 mg/dL (ref >39)
LDL Chol Calc (NIH): 105 mg/dL — ABNORMAL HIGH (ref 0–99)
Triglycerides: 75 mg/dL (ref 0–149)
VLDL Cholesterol Cal: 14 mg/dL (ref 5–40)

## 2023-01-03 LAB — HEMOGLOBIN A1C
Est. average glucose Bld gHb Est-mCnc: 134 mg/dL
Hgb A1c MFr Bld: 6.3 % — ABNORMAL HIGH (ref 4.8–5.6)

## 2023-01-13 ENCOUNTER — Telehealth: Payer: Self-pay | Admitting: Family Medicine

## 2023-01-19 NOTE — Telephone Encounter (Signed)
error 

## 2023-01-24 ENCOUNTER — Other Ambulatory Visit: Payer: Self-pay

## 2023-01-24 MED ORDER — VRAYLAR 3 MG PO CAPS: 3.0000 mg | ORAL_CAPSULE | Freq: Every day | ORAL | 1 refills | Status: DC

## 2023-01-24 MED ORDER — AMPHETAMINE-DEXTROAMPHET ER 30 MG PO CP24: 30.0000 mg | ORAL_CAPSULE | ORAL | 0 refills | Status: DC

## 2023-01-24 MED ORDER — VALACYCLOVIR HCL 500 MG PO TABS: 500.0000 mg | ORAL_TABLET | Freq: Two times a day (BID) | ORAL | 1 refills | Status: DC

## 2023-02-19 ENCOUNTER — Other Ambulatory Visit: Payer: Self-pay | Admitting: Family Medicine

## 2023-02-19 NOTE — Telephone Encounter (Signed)
Copied from CRM [id number]. Topic: Clinical - Medication Refill >> Feb 19, 2023  8:57 AM Dimitri Ped wrote: Most Recent Primary Care Visit:  Provider: Sudduth, KIRSTEN  Department: [facility]  Visit Type: OFFICE VISIT  Date: 01/02/2023  Medication: ***  Has the patient contacted their pharmacy?  (Agent: If no, request that the patient contact the pharmacy for the refill. If patient does not wish to contact the pharmacy document the reason why and proceed with request.) (Agent: If yes, when and what did the pharmacy advise?)  Is this the correct pharmacy for this prescription?  If no, delete pharmacy and type the correct one.  This is the patient's preferred pharmacy:  [id number] [place], [place] - [address] [place] [address] Phone: [phone] Fax: [phone]  [id number] - [place], [place] - [address] AT [facility] [address] [place] [address] Phone: [phone] Fax: [phone]   Has the prescription been filled recently?   Is the patient out of the medication?   Has the patient been seen for an appointment in the last year OR does the patient have an upcoming appointment?   Can we respond through MyChart?   Agent: Please be advised that Rx refills may take up to 3 business days. We ask that you follow-up with your pharmacy.

## 2023-02-20 MED ORDER — AMPHETAMINE-DEXTROAMPHET ER 30 MG PO CP24: 30.0000 mg | ORAL_CAPSULE | ORAL | 0 refills | Status: DC

## 2023-02-28 ENCOUNTER — Other Ambulatory Visit: Payer: Self-pay

## 2023-02-28 MED ORDER — VRAYLAR 3 MG PO CAPS: 3.0000 mg | ORAL_CAPSULE | Freq: Every day | ORAL | 1 refills | Status: DC

## 2023-02-28 NOTE — Telephone Encounter (Signed)
Copied from CRM [id number]. Topic: Clinical - Medication Refill >> Feb 28, 2023  8:29 AM Dennison Nancy wrote: Most Recent Primary Care Visit:  Provider: Dunsworth, KIRSTEN  Department: [facility]  Visit Type: OFFICE VISIT  Date: 01/02/2023  Medication: VRAYLAR 1/2 MG capsule  Has the patient contacted their pharmacy? No  (Agent: If no, request that the patient contact the pharmacy for the refill. If patient does not wish to contact the pharmacy document the reason why and proceed with request.) (Agent: If yes, when and what did the pharmacy advise?)  Is this the correct pharmacy for this prescription? Yes  If no, delete pharmacy and type the correct one.  This is the patient's preferred pharmacy:  [id number] [place], [place] - [address] [place] [address] Phone: [phone] Fax: [phone]  [id number] [place], [place] - [address] AT [facility] [address] [place] [address] Phone: [phone] Fax: [phone]   Has the prescription been filled recently? No   Is the patient out of the medication? Yes   Has the patient been seen for an appointment in the last year OR does the patient have an upcoming appointment? Yes   Can we respond through MyChart? Yes   Agent: Please be advised that Rx refills may take up to 3 business days. We ask that you follow-up with your pharmacy.

## 2023-03-03 ENCOUNTER — Other Ambulatory Visit: Payer: Self-pay

## 2023-03-03 ENCOUNTER — Telehealth: Payer: Self-pay

## 2023-03-03 DIAGNOSIS — F3131 Bipolar disorder, current episode depressed, mild: Secondary | ICD-10-CM

## 2023-03-03 MED ORDER — CARIPRAZINE HCL 1.5 MG PO CAPS: 1.5000 mg | ORAL_CAPSULE | Freq: Every day | ORAL | 2 refills | Status: DC

## 2023-03-03 NOTE — Telephone Encounter (Signed)
Copied from CRM [id number]. Topic: Appointments - Appointment Scheduling >> Mar 03, 2023  9:49 AM Carloyn Manner C wrote: Patient/patient representative is calling to schedule an appointment to see Dr. Sedalia Muta so that she can talk about her Bipolar medication that she was given. Patient is wanting to switch her current medication to the sample medication that Dr. Sedalia Muta had previously given her to test. Offered 03/03/2023 at 2:40pm appointment with Dr.Craft Patient declined and wanted only to see Dr. Sedalia Muta. Advised Dr. Sedalia Muta does not have any availability until 04/22/2023 patient did not want to schedule.

## 2023-03-03 NOTE — Telephone Encounter (Signed)
Called patient, patient stated that she will rather do the 1.5 mg of Vraylar once a day instead of the 3 mg daily.  Per Dr.Scroggs: Rx sent to requested pharmacy patient made aware.

## 2023-03-18 ENCOUNTER — Telehealth: Payer: Self-pay

## 2023-03-18 ENCOUNTER — Other Ambulatory Visit: Payer: Self-pay | Admitting: Family Medicine

## 2023-03-18 ENCOUNTER — Ambulatory Visit: Payer: 59 | Admitting: Family Medicine

## 2023-03-18 ENCOUNTER — Other Ambulatory Visit: Payer: Self-pay

## 2023-03-18 ENCOUNTER — Encounter: Payer: Self-pay | Admitting: Family Medicine

## 2023-03-18 VITALS — BP 110/70 | HR 99 | Temp 98.5°F | Resp 16 | Ht 63.0 in | Wt 255.0 lb

## 2023-03-18 DIAGNOSIS — K591 Functional diarrhea: Secondary | ICD-10-CM | POA: Diagnosis not present

## 2023-03-18 DIAGNOSIS — B372 Candidiasis of skin and nail: Secondary | ICD-10-CM

## 2023-03-18 DIAGNOSIS — R7303 Prediabetes: Secondary | ICD-10-CM

## 2023-03-18 DIAGNOSIS — Z6841 Body Mass Index (BMI) 40.0 and over, adult: Secondary | ICD-10-CM

## 2023-03-18 DIAGNOSIS — E66813 Obesity, class 3: Secondary | ICD-10-CM

## 2023-03-18 MED ORDER — TIRZEPATIDE-WEIGHT MANAGEMENT 2.5 MG/0.5ML ~~LOC~~ SOLN: 2.5000 mg | SUBCUTANEOUS | 0 refills | Status: DC

## 2023-03-18 MED ORDER — FLUCONAZOLE 150 MG PO TABS: 150.0000 mg | ORAL_TABLET | ORAL | 0 refills | Status: DC

## 2023-03-18 MED ORDER — NYSTATIN-TRIAMCINOLONE 100000-0.1 UNIT/GM-% EX OINT: 1.0000 | TOPICAL_OINTMENT | Freq: Two times a day (BID) | CUTANEOUS | 0 refills | Status: DC

## 2023-03-18 MED ORDER — AMPHETAMINE-DEXTROAMPHET ER 30 MG PO CP24: 30.0000 mg | ORAL_CAPSULE | ORAL | 0 refills | Status: DC

## 2023-03-18 NOTE — Telephone Encounter (Signed)
 Copied from CRM [id number]. Topic: Clinical - Medication Refill >> Mar 18, 2023  3:19 PM Powell HERO wrote: Most Recent Primary Care Visit:  Provider: TERESSA HARRIE HERO  Department: [facility]  Visit Type: ACUTE  Date: 03/18/2023  Medication: amphetamine -dextroamphetamine  (ADDERALL XR) 30 MG 24 hr capsule  Has the patient contacted their pharmacy? Yes, they said to call the office  Is this the correct pharmacy for this prescription? Yes If no, delete pharmacy and type the correct one.  This is the patient's preferred pharmacy:  [id number] [place], [place] - [address] [place] [address] Phone: [phone] Fax: [phone]    Has the prescription been filled recently? No  Is the patient out of the medication? No, just getting started early  Has the patient been seen for an appointment in the last year OR does the patient have an upcoming appointment? Yes  Can we respond through MyChart? Call on phone please  Agent: Please be advised that Rx refills may take up to 3 business days. We ask that you follow-up with your pharmacy.

## 2023-03-18 NOTE — Telephone Encounter (Signed)
 Copied from CRM [id number]. Topic: Clinical - Prescription Issue >> Mar 18, 2023  4:16 PM Carmell R wrote: Reason for CRM: Pt stated that tirzepatide  (ZEPBOUND ) 2.5 MG/0.5ML injection vial with the insurance is still $1000 and unaffordable. Looking for a cheaper alternative if there is one available.

## 2023-03-18 NOTE — Telephone Encounter (Addendum)
 PA has been submitted for zepbound  Made patient aware insurance stated PA not needed, recommend she call her insurance company

## 2023-03-18 NOTE — Telephone Encounter (Signed)
 Copied from CRM [id number]. Topic: Clinical - Prescription Issue >> Mar 18, 2023  3:17 PM Geroge Baseman wrote: Reason for CRM: tirzepatide (ZEPBOUND) 2.5 MG/0.5ML injection vial, states pharmacy needs a prior authorization before her insurance will approve it.

## 2023-03-18 NOTE — Telephone Encounter (Signed)
 Copied from CRM [id number]. Topic: Clinical - Medication Refill >> Mar 18, 2023  3:19 PM Powell HERO wrote: Most Recent Primary Care Visit:  Provider: TERESSA HARRIE HERO  Department: [facility]  Visit Type: ACUTE  Date: 03/18/2023  Medication: ***  Has the patient contacted their pharmacy?  (Agent: If no, request that the patient contact the pharmacy for the refill. If patient does not wish to contact the pharmacy document the reason why and proceed with request.) (Agent: If yes, when and what did the pharmacy advise?)  Is this the correct pharmacy for this prescription?  If no, delete pharmacy and type the correct one.  This is the patient's preferred pharmacy:  [id number] [place], [place] - [address] [place] [address] Phone: [phone] Fax: [phone]  [id number] - [place], [place] - [address] AT [facility] [address] [place] [address] Phone: [phone] Fax: [phone]   Has the prescription been filled recently?   Is the patient out of the medication?   Has the patient been seen for an appointment in the last year OR does the patient have an upcoming appointment?   Can we respond through MyChart?   Agent: Please be advised that Rx refills may take up to 3 business days. We ask that you follow-up with your pharmacy.

## 2023-03-18 NOTE — Telephone Encounter (Signed)
 Copied from CRM [id number]. Topic: Clinical - Prescription Issue >> Mar 18, 2023  2:32 PM Elle L wrote: Reason for CRM: The patient states that the pharmacy did not receive her prescription for tirzepatide  (ZEPBOUND ) 2.5 MG/0.5ML injection vial. I informed the patient that I saw it was called in for her but she requested to see if it could be sent to them again.   [id number] - [place], [place] - [address]  Phone: [phone] Fax: [phone]

## 2023-03-18 NOTE — Progress Notes (Signed)
 Acute Office Visit  Subjective:   Discussed the use of AI scribe software for clinical note transcription with the patient, who gave verbal consent to proceed.   Patient ID: Anna [name], female    DOB: 1970-06-06, 53 y.o.   MRN: [id number]  Chief Complaint  Patient presents with   Rash    HPI: Intertrigo: The patient, with a history of prediabetes and controlled high blood pressure, presents with a worsening skin condition that started around Thanksgiving. The patient has attempted to manage the condition with over-the-counter baby powder, but the condition has not improved. The skin condition is characterized by itching and painful areas, particularly in the underarm and abdominal regions.   Diarrhea: The patient also reports frequent diarrhea. The patient does not take anything for her loose stools. She states that she is lactose intolerant and manages by not including dairy. She has not seen a gastroenterologist for this condition.   Prediabetes: A1C 6.3% increased from 5.8% in July. The patient has not been on any medication to manage their prediabetes because of her insurance. She has other comorbidities including HTN, morbid obesity and now severe candidiasis intertrigo of the under arms, breasts and panis areas. The patient expresses a willingness to try new treatments to manage their conditions if insurance will cover. She states that metformin  is not an option due to her current GI issues.  Morbid obesity: BMI 43.17. She has gained 10 pounds since her last visit. She is not able to exercise based on her school schedule and she tries to eat as healthy as she can. The holidays have not helped with her weight gain.   Past Medical History:  Diagnosis Date   ADD (attention deficit disorder)    Arthritis    Asthma    seasonal   Bipolar affective disorder, current episode depressed (HCC)    GERD (gastroesophageal reflux disease)    occ   History of recurrent UTIs 03/11/2012    Hypertension    Moderate persistent asthma with exacerbation 12/16/2021    Past Surgical History:  Procedure Laterality Date   ABDOMINAL HYSTERECTOMY  2010   endometriosis. Total hysterectomy   ANTERIOR CERVICAL DECOMP/DISCECTOMY FUSION N/A 07/13/2012   Procedure: ANTERIOR CERVICAL DECOMPRESSION/DISCECTOMY FUSION 1 LEVEL;  Surgeon: Arley SHAUNNA Helling, MD;  Location: [facility];  Service: Neurosurgery;  Laterality: N/A;  Anterior Cervical Decompression/Discectomy Cervical Three-Four   CARPAL TUNNEL RELEASE Right    CERVICAL DISC SURGERY  09   CHOLECYSTECTOMY      Family History  Problem Relation Age of Onset   Breast cancer Neg Hx     Social History   Socioeconomic History   Marital status: Married    Spouse name: Not on file   Number of children: Not on file   Years of education: Not on file   Highest education level: Not on file  Occupational History   Not on file  Tobacco Use   Smoking status: Never   Smokeless tobacco: Never  Vaping Use   Vaping status: Never Used  Substance and Sexual Activity   Alcohol use: No   Drug use: No    Comment: 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   Sexual activity: Not on file  Other Topics Concern   Not on file  Social History Narrative   Not on file   Social Drivers of Health   Financial Resource Strain: Low Risk  (05/28/2022)   Overall Financial Resource Strain (CARDIA)    Difficulty of Paying Living  Expenses: Not hard at all  Food Insecurity: No Food Insecurity (05/28/2022)   Hunger Vital Sign    Worried About Running Out of Food in the Last Year: Never true    Ran Out of Food in the Last Year: Never true  Transportation Needs: No Transportation Needs (05/28/2022)   PRAPARE - Administrator, Civil Service (Medical): No    Lack of Transportation (Non-Medical): No  Physical Activity: Inactive (09/11/2022)   Exercise Vital Sign    Days of Exercise per Week: 0 days    Minutes of Exercise per Session: 0 min  Stress: No Stress  Concern Present (05/28/2022)   [employer] of Occupational Health - Occupational Stress Questionnaire    Feeling of Stress : Not at all  Social Connections: Moderately Integrated (05/28/2022)   Social Connection and Isolation Panel [NHANES]    Frequency of Communication with Friends and Family: More than three times a week    Frequency of Social Gatherings with Friends and Family: Three times a week    Attends Religious Services: More than 4 times per year    Active Member of Clubs or Organizations: No    Attends Banker Meetings: Never    Marital Status: Married  Catering Manager Violence: Not At Risk (05/28/2022)   Humiliation, Afraid, Rape, and Kick questionnaire    Fear of Current or Ex-Partner: No    Emotionally Abused: No    Physically Abused: No    Sexually Abused: No    Outpatient Medications Prior to Visit  Medication Sig Dispense Refill   albuterol  (VENTOLIN  HFA) 108 (90 Base) MCG/ACT inhaler Inhale 2 puffs into the lungs every 6 (six) hours as needed for wheezing or shortness of breath. 18 g 1   Albuterol -Budesonide (AIRSUPRA ) 90-80 MCG/ACT AERO Inhale 90 mcg into the lungs every 6 (six) hours as needed for up to 28 doses. 5.9 g 1   ALPRAZolam  (XANAX ) 0.5 MG tablet TAKE 1 TABLET BY MOUTH DAILY AS NEEDED FOR SEVERE ANXIETY 30 tablet 2   amLODipine -olmesartan  (AZOR ) 10-40 MG tablet TAKE 1 TABLET BY MOUTH EVERY DAY 90 tablet 1   cariprazine  (VRAYLAR ) 1.5 MG capsule Take 1 capsule (1.5 mg total) by mouth daily. 30 capsule 2   cyclobenzaprine  (FLEXERIL ) 5 MG tablet Take 1 tablet (5 mg total) by mouth 2 (two) times daily as needed for muscle spasms. 180 tablet 0   fluticasone  (FLONASE ) 50 MCG/ACT nasal spray Place 2 sprays into both nostrils daily. 18 mL 2   omega-3 acid ethyl esters (LOVAZA ) 1 g capsule TAKE 1 CAPSULE BY MOUTH TWICE A DAY 180 capsule 0   ondansetron  (ZOFRAN ) 4 MG tablet Take 1 tablet (4 mg total) by mouth every 8 (eight) hours as needed for nausea  or vomiting. 20 tablet 0   PARoxetine  (PAXIL -CR) 37.5 MG 24 hr tablet TAKE 1 TABLET BY MOUTH EVERY DAY 90 tablet 1   rosuvastatin  (CRESTOR ) 20 MG tablet Take 1 tablet (20 mg total) by mouth daily. 90 tablet 1   traZODone  (DESYREL ) 50 MG tablet TAKE 1 TABLET BY MOUTH EVERYDAY AT BEDTIME 90 tablet 3   triamcinolone  cream (KENALOG ) 0.1 % Apply 1 application. topically 2 (two) times daily. 45 g 1   valACYclovir  (VALTREX ) 500 MG tablet Take 1 tablet (500 mg total) by mouth 2 (two) times daily. 14 tablet 1   Vitamin D , Ergocalciferol , (DRISDOL ) 1.25 MG (50000 UNIT) CAPS capsule TAKE 1 CAPSULE (50,000 UNITS TOTAL) BY MOUTH TWO TIMES A WEEK  24 capsule 0   VRAYLAR  3 MG capsule Take 1 capsule (3 mg total) by mouth daily. 90 capsule 1   amphetamine -dextroamphetamine  (ADDERALL XR) 30 MG 24 hr capsule Take 1 capsule (30 mg total) by mouth every morning. 30 capsule 0   doxycycline  (VIBRA -TABS) 100 MG tablet Take 1 tablet (100 mg total) by mouth 2 (two) times daily. 20 tablet 0   No facility-administered medications prior to visit.    Allergies  Allergen Reactions   Avelox [Moxifloxacin Hcl In Nacl] Anaphylaxis   Lactose Intolerance (Gi) Diarrhea   Tape Other (See Comments)    If tape on a while will pull off skin    Review of Systems  Constitutional:  Negative for chills, diaphoresis, fatigue and fever.  HENT:  Negative for congestion, ear pain and sinus pain.   Respiratory:  Negative for cough and shortness of breath.   Cardiovascular:  Negative for chest pain.  Gastrointestinal:  Negative for abdominal pain, constipation, nausea and vomiting.  Genitourinary:  Negative for dysuria.  Musculoskeletal:  Negative for arthralgias.  Skin:        Skin lesions - skin folds  Neurological:  Negative for weakness and headaches.  Psychiatric/Behavioral:  Negative for dysphoric mood. The patient is not nervous/anxious.        Objective:        03/18/2023    1:23 PM 01/02/2023    7:44 AM 11/27/2022    10:46 AM  Vitals with BMI  Height 5' 3 5' 3 5' 3  Weight 255 lbs 244 lbs 240 lbs  BMI 45.18 43.23 42.52  Systolic 110 118 879  Diastolic 70 86 84  Pulse 99 113 88    Orthostatic VS for the past 72 hrs (Last 3 readings):  Patient Position BP Location Cuff Size  03/18/23 1323 Sitting Right Arm Large     Physical Exam Vitals reviewed.  Constitutional:      General: She is not in acute distress.    Appearance: Normal appearance.  Eyes:     Conjunctiva/sclera: Conjunctivae normal.  Neck:     Vascular: No carotid bruit.  Cardiovascular:     Rate and Rhythm: Normal rate and regular rhythm.     Heart sounds: Normal heart sounds. No murmur heard. Pulmonary:     Effort: Pulmonary effort is normal.     Breath sounds: Normal breath sounds. No wheezing.  Skin:    Findings: Erythema and lesion present.  Neurological:     Mental Status: She is alert. Mental status is at baseline.  Psychiatric:        Mood and Affect: Mood normal.        Behavior: Behavior normal.      Health Maintenance Due  Topic Date Due   Zoster Vaccines- Shingrix  (1 of 2) Never done   Fecal DNA (Cologuard)  Never done    There are no preventive care reminders to display for this patient.   Lab Results  Component Value Date   TSH 1.000 03/29/2021   Lab Results  Component Value Date   WBC 10.3 01/02/2023   HGB 12.9 01/02/2023   HCT 40.1 01/02/2023   MCV 93 01/02/2023   PLT 299 01/02/2023   Lab Results  Component Value Date   NA 141 01/02/2023   K 4.0 01/02/2023   CO2 24 01/02/2023   GLUCOSE 93 01/02/2023   BUN 13 01/02/2023   CREATININE 0.96 01/02/2023   BILITOT 0.6 01/02/2023   ALKPHOS 91 01/02/2023  AST 25 01/02/2023   ALT 24 01/02/2023   PROT 7.2 01/02/2023   ALBUMIN 4.5 01/02/2023   CALCIUM  9.6 01/02/2023   EGFR 70 01/02/2023   Lab Results  Component Value Date   CHOL 193 01/02/2023   Lab Results  Component Value Date   HDL 74 01/02/2023   Lab Results  Component Value  Date   LDLCALC 105 (H) 01/02/2023   Lab Results  Component Value Date   TRIG 75 01/02/2023   Lab Results  Component Value Date   CHOLHDL 2.6 01/02/2023   Lab Results  Component Value Date   HGBA1C 6.3 (H) 01/02/2023       Assessment & Plan:  Candidiasis, intertrigo Assessment & Plan: Chronic skin condition with recent worsening since Thanksgiving. Itching and discomfort reported. Over-the-counter baby powder used without significant improvement. -Prescribe Nystatin  ointment for topical application. -Prescribe Diflucan  150mg  once weekly for 4 weeks.  Orders: -     Nystatin -Triamcinolone ; Apply 1 Application topically 2 (two) times daily.  Dispense: 30 g; Refill: 0 -     Fluconazole ; Take 1 tablet (150 mg total) by mouth once a week.  Dispense: 1 tablet; Refill: 0  Class 3 severe obesity due to excess calories with serious comorbidity and body mass index (BMI) of 45.0 to 49.9 in adult [facility]) Assessment & Plan: Current BMI 45.17 -Zepbound  ordered, based on insurance coverage this may help with insulin resistance. -consider dietician referral -Aim to do some physical activity for 150 minutes per week. This is typically divided into 5 days per week, 30 minutes per day. The activity should be enough to get your heart rate up. Anything is better than nothing if you have time constraints.       Prediabetes Assessment & Plan: A1c of 6.3, not currently on any medication. Discussed options for weight loss and blood sugar control, including Zepbound  and Metformin . Patient has a history of gastrointestinal issues, particularly diarrhea. -Submit preauthorization for Zepbound . Recommend continue to work on eating healthy diet and exercise. -Plan to follow up in 2-3 weeks to discuss approval status and potential initiation of treatment.   Functional diarrhea Assessment & Plan: Chronic  Patient does not take any medication and she has never saw a specialist. -Consider referral if  condition worsens      Meds ordered this encounter  Medications   nystatin -triamcinolone  ointment (MYCOLOG)    Sig: Apply 1 Application topically 2 (two) times daily.    Dispense:  30 g    Refill:  0   fluconazole  (DIFLUCAN ) 150 MG tablet    Sig: Take 1 tablet (150 mg total) by mouth once a week.    Dispense:  1 tablet    Refill:  0   DISCONTD: tirzepatide  (ZEPBOUND ) 2.5 MG/0.5ML injection vial    Sig: Inject 2.5 mg into the skin once a week for 4 doses.    Dispense:  2 mL    Refill:  0    No orders of the defined types were placed in this encounter.    Follow-up: No follow-ups on file.  An After Visit Summary was printed and given to the patient.  Harrie Cedar, FNP [facility] [phone]

## 2023-03-18 NOTE — Telephone Encounter (Signed)
**Note De-identified  [place] Obfuscation** Please advise 

## 2023-03-19 DIAGNOSIS — K591 Functional diarrhea: Secondary | ICD-10-CM | POA: Insufficient documentation

## 2023-03-19 NOTE — Assessment & Plan Note (Addendum)
 Current BMI 45.17 -Zepbound  ordered, based on insurance coverage this may help with insulin resistance. -consider dietician referral -Aim to do some physical activity for 150 minutes per week. This is typically divided into 5 days per week, 30 minutes per day. The activity should be enough to get your heart rate up. Anything is better than nothing if you have time constraints.

## 2023-03-19 NOTE — Assessment & Plan Note (Signed)
 A1c of 6.3, not currently on any medication. Discussed options for weight loss and blood sugar control, including Zepbound  and Metformin . Patient has a history of gastrointestinal issues, particularly diarrhea. -Submit preauthorization for Zepbound . Recommend continue to work on eating healthy diet and exercise. -Plan to follow up in 2-3 weeks to discuss approval status and potential initiation of treatment.

## 2023-03-19 NOTE — Assessment & Plan Note (Signed)
 Chronic skin condition with recent worsening since Thanksgiving. Itching and discomfort reported. Over-the-counter baby powder used without significant improvement. -Prescribe Nystatin  ointment for topical application. -Prescribe Diflucan  150mg  once weekly for 4 weeks.

## 2023-03-20 NOTE — Assessment & Plan Note (Signed)
 Chronic  Patient does not take any medication and she has never saw a specialist. -Consider referral if condition worsens

## 2023-03-25 ENCOUNTER — Ambulatory Visit: Payer: Self-pay | Admitting: Family Medicine

## 2023-03-25 NOTE — Telephone Encounter (Signed)
 Copied from CRM [id number]. Topic: Clinical - Medication Refill >> Mar 25, 2023  8:29 AM Nestora PARAS wrote: Most Recent Primary Care Visit:  Provider: TERESSA HARRIE HERO  Department: [facility]  Visit Type: ACUTE  Date: 03/18/2023  Medication: fluconazole  (DIFLUCAN ) 150 MG tablet   Has the patient contacted their pharmacy? Yes (Agent: If no, request that the patient contact the pharmacy for the refill. If patient does not wish to contact the pharmacy document the reason why and proceed with request.) (Agent: If yes, when and what did the pharmacy advise?) Contact PCP  Is this the correct pharmacy for this prescription? Yes If no, delete pharmacy and type the correct one.  This is the patient's preferred pharmacy:  [id number] [place], [place] - [address] [place] [address] Phone: [phone] Fax: [phone]    Has the prescription been filled recently? Yes  Is the patient out of the medication? Yes  Has the patient been seen for an appointment in the last year OR does the patient have an upcoming appointment? Yes  Can we respond through MyChart? No  Agent: Please be advised that Rx refills may take up to 3 business days. We ask that you follow-up with your pharmacy.

## 2023-03-26 ENCOUNTER — Other Ambulatory Visit: Payer: Self-pay | Admitting: Family Medicine

## 2023-03-27 ENCOUNTER — Other Ambulatory Visit: Payer: Self-pay | Admitting: Family Medicine

## 2023-03-27 DIAGNOSIS — B372 Candidiasis of skin and nail: Secondary | ICD-10-CM

## 2023-04-02 ENCOUNTER — Encounter: Payer: Self-pay | Admitting: Family Medicine

## 2023-04-02 ENCOUNTER — Ambulatory Visit: Payer: Self-pay | Admitting: Family Medicine

## 2023-04-02 NOTE — Telephone Encounter (Signed)
Chief Complaint: rash Symptoms: worsening yeast rash that has spread to bilateral arms, both breasts, and stomach, itching Frequency: pt states she was diagnosed with a yeast rash two weeks ago and prescribed fluconazole with no improvement Pertinent Negatives: Patient denies joint pain, fever, sore throat, open sores, drainage, pain Disposition: [] ED /[] Urgent Care (no appt availability in office) / [x] Appointment(In office/virtual)/ []  [facility]/ [] Home Care/ [x] Refused Recommended Disposition /[] [facility]/ []  Follow-up with PCP Additional Notes: Pt reports worsening yeast rash that has spread to bilateral arms, both breasts, and across her stomach. Pt reports being diagnosed with a yeast rash two weeks ago and was prescribed fluconazole, which she states has not improved symptoms. Pt endorses redness and mild itching, but denies pain, open sores, drainage, fever, sore throat, joint pain. Per protocol, this RN attempted to schedule an appt. Pt stated she would only like to be seen by Dr. Sedalia Muta, who has no openings. Pt states she has an appt scheduled with Dr. Sedalia Muta for 2/17 and will wait for that appt instead. Pt advised to call back for any worsening symptoms. Pt verbalized understanding.   Copied from CRM [id number]. Topic: Clinical - Red Word Triage >> Apr 02, 2023  9:46 AM Thomes Dinning wrote: Red Word that prompted transfer to Nurse Triage: Patient has a possible yeast infection under her arms,& breast area which is causing pain and discomfort Reason for Disposition  Mild widespread rash  (Exception: Heat rash lasting 3 days or less.)  Answer Assessment - Initial Assessment Questions 1. APPEARANCE of RASH: "Describe the rash." (e.g., spots, blisters, raised areas, skin peeling, scaly)     Red, no open sores, no drainage 2. SIZE: "How big are the spots?" (e.g., tip of pen, eraser, coin; inches, centimeters)     About 2-3 inches under arms, 3-4 inches under breasts, all along  my C-section scar 3. LOCATION: "Where is the rash located?"     Bilateral arms, breasts, belly  4. COLOR: "What color is the rash?" (Note: It is difficult to assess rash color in people with darker-colored skin. When this situation occurs, simply ask the caller to describe what they see.)     Red 5. ONSET: "When did the rash begin?"     Was seen two weeks ago and prescribed fluconazole  6. FEVER: "Do you have a fever?" If Yes, ask: "What is your temperature, how was it measured, and when did it start?"     Np 7. ITCHING: "Does the rash itch?" If Yes, ask: "How bad is the itch?" (Scale 1-10; or mild, moderate, severe)     Mild 8. CAUSE: "What do you think is causing the rash?"     Diagnosed with yeast infection 2 weeks ago 9. MEDICINE FACTORS: "Have you started any new medicines within the last 2 weeks?" (e.g., antibiotics)      None, just fluconazole for rash 10. OTHER SYMPTOMS: "Do you have any other symptoms?" (e.g., dizziness, headache, sore throat, joint pain)       None, no pain  Protocols used: Rash or Redness - [facility]

## 2023-04-03 ENCOUNTER — Ambulatory Visit: Payer: 59 | Admitting: Family Medicine

## 2023-04-03 NOTE — Progress Notes (Unsigned)
Subjective:  Patient ID: Anna [name], female    DOB: 1970-10-29  Age: 53 y.o. MRN: [id number]  Chief Complaint  Patient presents with   Rash    HPI        03/18/2023    1:23 PM 09/11/2022    7:51 AM 03/07/2022    3:32 PM 12/31/2021    9:18 AM 09/17/2021    8:05 PM  Depression screen PHQ 2/9  Decreased Interest 0 0 1 0 0  Down, Depressed, Hopeless 0 0 1 0 0  PHQ - 2 Score 0 0 2 0 0  Altered sleeping 0 0 1 0 0  Tired, decreased energy 0 0 1 1 1   Change in appetite 0 0 1 0 0  Feeling bad or failure about yourself  0 0 0 0 0  Trouble concentrating 0 0 0 0 0  Moving slowly or fidgety/restless 0 0 0 0 0  Suicidal thoughts 0 0 0 0 0  PHQ-9 Score 0 0 5 1 1   Difficult doing work/chores Not difficult at all Not difficult at all Very difficult Not difficult at all Not difficult at all        03/18/2023    1:23 PM  Fall Risk   Falls in the past year? 0  Number falls in past yr: 0  Injury with Fall? 0  Risk for fall due to : No Fall Risks  Follow up Falls evaluation completed    Patient Care Team: Maliyah Willets, Fritzi Mandes, MD as PCP - General (Family Medicine)   Review of Systems  Current Outpatient Medications on File Prior to Visit  Medication Sig Dispense Refill   albuterol (VENTOLIN HFA) 108 (90 Base) MCG/ACT inhaler Inhale 2 puffs into the lungs every 6 (six) hours as needed for wheezing or shortness of breath. 18 g 1   Albuterol-Budesonide (AIRSUPRA) 90-80 MCG/ACT AERO Inhale 90 mcg into the lungs every 6 (six) hours as needed for up to 28 doses. 5.9 g 1   ALPRAZolam (XANAX) 0.5 MG tablet TAKE 1 TABLET BY MOUTH DAILY AS NEEDED FOR SEVERE ANXIETY 30 tablet 2   amLODipine-olmesartan (AZOR) 10-40 MG tablet TAKE 1 TABLET BY MOUTH EVERY DAY 90 tablet 1   amphetamine-dextroamphetamine (ADDERALL XR) 30 MG 24 hr capsule Take 1 capsule (30 mg total) by mouth every morning. 30 capsule 0   cariprazine (VRAYLAR) 1.5 MG capsule Take 1 capsule (1.5 mg total) by mouth daily. 30 capsule 2    cyclobenzaprine (FLEXERIL) 5 MG tablet Take 1 tablet (5 mg total) by mouth 2 (two) times daily as needed for muscle spasms. 180 tablet 0   fluticasone (FLONASE) 50 MCG/ACT nasal spray Place 2 sprays into both nostrils daily. 18 mL 2   nystatin-triamcinolone ointment (MYCOLOG) Apply 1 Application topically 2 (two) times daily. 30 g 0   omega-3 acid ethyl esters (LOVAZA) 1 g capsule TAKE 1 CAPSULE BY MOUTH TWICE A DAY 180 capsule 0   ondansetron (ZOFRAN) 4 MG tablet Take 1 tablet (4 mg total) by mouth every 8 (eight) hours as needed for nausea or vomiting. 20 tablet 0   PARoxetine (PAXIL-CR) 37.5 MG 24 hr tablet TAKE 1 TABLET BY MOUTH EVERY DAY 90 tablet 1   rosuvastatin (CRESTOR) 20 MG tablet TAKE 1 TABLET BY MOUTH EVERY DAY 90 tablet 1   traZODone (DESYREL) 50 MG tablet TAKE 1 TABLET BY MOUTH EVERYDAY AT BEDTIME 90 tablet 3   triamcinolone cream (KENALOG) 0.1 % Apply 1 application. topically 2 (two)  times daily. 45 g 1   valACYclovir (VALTREX) 500 MG tablet Take 1 tablet (500 mg total) by mouth 2 (two) times daily. 14 tablet 1   Vitamin D, Ergocalciferol, (DRISDOL) 1.25 MG (50000 UNIT) CAPS capsule TAKE 1 CAPSULE (50,000 UNITS TOTAL) BY MOUTH TWO TIMES A WEEK 24 capsule 0   No current facility-administered medications on file prior to visit.   Past Medical History:  Diagnosis Date   ADD (attention deficit disorder)    Arthritis    Asthma    seasonal   Bipolar affective disorder, current episode depressed (HCC)    GERD (gastroesophageal reflux disease)    occ   History of recurrent UTIs 03/11/2012   Hypertension    Moderate persistent asthma with exacerbation 12/16/2021   Past Surgical History:  Procedure Laterality Date   ABDOMINAL HYSTERECTOMY  2010   endometriosis. Total hysterectomy   ANTERIOR CERVICAL DECOMP/DISCECTOMY FUSION N/A 07/13/2012   Procedure: ANTERIOR CERVICAL DECOMPRESSION/DISCECTOMY FUSION 1 LEVEL;  Surgeon: Mariam Dollar, MD;  Location: [facility];  Service:  Neurosurgery;  Laterality: N/A;  Anterior Cervical Decompression/Discectomy Cervical Three-Four   CARPAL TUNNEL RELEASE Right    CERVICAL DISC SURGERY  09   CHOLECYSTECTOMY      Family History  Problem Relation Age of Onset   Breast cancer Neg Hx    Social History   Socioeconomic History   Marital status: Married    Spouse name: Not on file   Number of children: Not on file   Years of education: Not on file   Highest education level: Not on file  Occupational History   Not on file  Tobacco Use   Smoking status: Never   Smokeless tobacco: Never  Vaping Use   Vaping status: Never Used  Substance and Sexual Activity   Alcohol use: No   Drug use: No    Comment: 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   Sexual activity: Not on file  Other Topics Concern   Not on file  Social History Narrative   Not on file   Social Drivers of Health   Financial Resource Strain: Low Risk  (05/28/2022)   Overall Financial Resource Strain (CARDIA)    Difficulty of Paying Living Expenses: Not hard at all  Food Insecurity: No Food Insecurity (05/28/2022)   Hunger Vital Sign    Worried About Running Out of Food in the Last Year: Never true    Ran Out of Food in the Last Year: Never true  Transportation Needs: No Transportation Needs (05/28/2022)   PRAPARE - Administrator, Civil Service (Medical): No    Lack of Transportation (Non-Medical): No  Physical Activity: Inactive (09/11/2022)   Exercise Vital Sign    Days of Exercise per Week: 0 days    Minutes of Exercise per Session: 0 min  Stress: No Stress Concern Present (05/28/2022)   [employer] of Occupational Health - Occupational Stress Questionnaire    Feeling of Stress : Not at all  Social Connections: Moderately Integrated (05/28/2022)   Social Connection and Isolation Panel [NHANES]    Frequency of Communication with Friends and Family: More than three times a week    Frequency of Social Gatherings with Friends and Family:  Three times a week    Attends Religious Services: More than 4 times per year    Active Member of Clubs or Organizations: No    Attends Banker Meetings: Never    Marital Status: Married    Objective:  There were no vitals taken for this visit.     03/18/2023    1:23 PM 01/02/2023    7:44 AM 11/27/2022   10:46 AM  BP/Weight  Systolic BP 110 118 120  Diastolic BP 70 86 84  Wt. (Lbs) 255 244 240  BMI 45.17 kg/m2 43.22 kg/m2 42.51 kg/m2    Physical Exam  Diabetic Foot Exam - Simple   No data filed      Lab Results  Component Value Date   WBC 10.3 01/02/2023   HGB 12.9 01/02/2023   HCT 40.1 01/02/2023   PLT 299 01/02/2023   GLUCOSE 93 01/02/2023   CHOL 193 01/02/2023   TRIG 75 01/02/2023   HDL 74 01/02/2023   LDLCALC 105 (H) 01/02/2023   ALT 24 01/02/2023   AST 25 01/02/2023   NA 141 01/02/2023   K 4.0 01/02/2023   CL 100 01/02/2023   CREATININE 0.96 01/02/2023   BUN 13 01/02/2023   CO2 24 01/02/2023   TSH 1.000 03/29/2021   HGBA1C 6.3 (H) 01/02/2023      Assessment & Plan:    There are no diagnoses linked to this encounter.   Meds ordered this encounter  Medications   fluconazole (DIFLUCAN) 100 MG tablet    Sig: Take 1 tablet (100 mg total) by mouth daily.    Dispense:  7 tablet    Refill:  0    No orders of the defined types were placed in this encounter.    Follow-up: No follow-ups on file.   I,Marla I Leal-Borjas,acting as a scribe for Blane Ohara, MD.,have documented all relevant documentation on the behalf of Blane Ohara, MD,as directed by  Blane Ohara, MD while in the presence of Blane Ohara, MD.   An After Visit Summary was printed and given to the patient.  Blane Ohara, MD [facility] [phone]

## 2023-04-03 NOTE — Telephone Encounter (Signed)
I spoke with the pt. I have her scheduled for this afternoon with Dr. Sedalia Muta. The pt is going to come into the office.

## 2023-04-04 ENCOUNTER — Encounter: Payer: Self-pay | Admitting: Family Medicine

## 2023-04-04 ENCOUNTER — Ambulatory Visit (INDEPENDENT_AMBULATORY_CARE_PROVIDER_SITE_OTHER): Payer: 59 | Admitting: Family Medicine

## 2023-04-04 VITALS — BP 130/88 | HR 71 | Temp 97.8°F | Ht 63.0 in | Wt 257.8 lb

## 2023-04-04 DIAGNOSIS — F902 Attention-deficit hyperactivity disorder, combined type: Secondary | ICD-10-CM

## 2023-04-04 DIAGNOSIS — E66813 Obesity, class 3: Secondary | ICD-10-CM

## 2023-04-04 DIAGNOSIS — F3131 Bipolar disorder, current episode depressed, mild: Secondary | ICD-10-CM | POA: Diagnosis not present

## 2023-04-04 DIAGNOSIS — J4521 Mild intermittent asthma with (acute) exacerbation: Secondary | ICD-10-CM

## 2023-04-04 DIAGNOSIS — B372 Candidiasis of skin and nail: Secondary | ICD-10-CM | POA: Diagnosis not present

## 2023-04-04 DIAGNOSIS — Z6841 Body Mass Index (BMI) 40.0 and over, adult: Secondary | ICD-10-CM

## 2023-04-04 DIAGNOSIS — I10 Essential (primary) hypertension: Secondary | ICD-10-CM

## 2023-04-04 DIAGNOSIS — R7303 Prediabetes: Secondary | ICD-10-CM

## 2023-04-04 DIAGNOSIS — E782 Mixed hyperlipidemia: Secondary | ICD-10-CM

## 2023-04-04 LAB — CBC WITH DIFFERENTIAL/PLATELET
Basophils Absolute: 0.1 10*3/uL (ref 0.0–0.2)
Basos: 1 %
EOS (ABSOLUTE): 0.2 10*3/uL (ref 0.0–0.4)
Eos: 2 %
Hematocrit: 39.8 % (ref 34.0–46.6)
Hemoglobin: 13 g/dL (ref 11.1–15.9)
Immature Grans (Abs): 0.1 10*3/uL (ref 0.0–0.1)
Immature Granulocytes: 1 %
Lymphocytes Absolute: 3.6 10*3/uL — ABNORMAL HIGH (ref 0.7–3.1)
Lymphs: 40 %
MCH: 30 pg (ref 26.6–33.0)
MCHC: 32.7 g/dL (ref 31.5–35.7)
MCV: 92 fL (ref 79–97)
Monocytes Absolute: 0.6 10*3/uL (ref 0.1–0.9)
Monocytes: 7 %
Neutrophils Absolute: 4.3 10*3/uL (ref 1.4–7.0)
Neutrophils: 49 %
Platelets: 315 10*3/uL (ref 150–450)
RBC: 4.33 x10E6/uL (ref 3.77–5.28)
RDW: 11.7 % (ref 11.7–15.4)
WBC: 8.9 10*3/uL (ref 3.4–10.8)

## 2023-04-04 LAB — COMPREHENSIVE METABOLIC PANEL
ALT: 12 [IU]/L (ref 0–32)
AST: 15 [IU]/L (ref 0–40)
Albumin: 3.9 g/dL (ref 3.8–4.9)
Alkaline Phosphatase: 91 [IU]/L (ref 44–121)
BUN/Creatinine Ratio: 7 — ABNORMAL LOW (ref 9–23)
BUN: 7 mg/dL (ref 6–24)
Bilirubin Total: 0.3 mg/dL (ref 0.0–1.2)
CO2: 24 mmol/L (ref 20–29)
Calcium: 8.9 mg/dL (ref 8.7–10.2)
Chloride: 102 mmol/L (ref 96–106)
Creatinine, Ser: 1.04 mg/dL — ABNORMAL HIGH (ref 0.57–1.00)
Globulin, Total: 2.5 g/dL (ref 1.5–4.5)
Glucose: 110 mg/dL — ABNORMAL HIGH (ref 70–99)
Potassium: 4.5 mmol/L (ref 3.5–5.2)
Sodium: 142 mmol/L (ref 134–144)
Total Protein: 6.4 g/dL (ref 6.0–8.5)
eGFR: 64 mL/min/{1.73_m2} (ref >59)

## 2023-04-04 LAB — HEMOGLOBIN A1C
Est. average glucose Bld gHb Est-mCnc: 137 mg/dL
Hgb A1c MFr Bld: 6.4 % — ABNORMAL HIGH (ref 4.8–5.6)

## 2023-04-04 MED ORDER — FLUCONAZOLE 100 MG PO TABS: 100.0000 mg | ORAL_TABLET | Freq: Every day | ORAL | 0 refills | Status: DC

## 2023-04-04 NOTE — Patient Instructions (Signed)
Start on fluconazole 100 mg daily x 7 days.  Consider trying interdry.  Continue lotrisone as needed.  Also a barrier powder is good.

## 2023-04-05 NOTE — Assessment & Plan Note (Signed)
Well controlled.  No changes to medicines.  Continue rosuvastatin 20 mg daily.  Continue Lovaza 1 g 1 capsule twice daily.  Continue to work on eating a healthy diet and exercise.  Labs drawn today.

## 2023-04-05 NOTE — Assessment & Plan Note (Signed)
The current medical regimen is effective;  continue present plan and medications.

## 2023-04-05 NOTE — Assessment & Plan Note (Signed)
Start on fluconazole 100 mg daily x 7 days.  Consider trying interdry.  Continue lotrisone as needed.  Also a barrier powder is good.

## 2023-04-05 NOTE — Assessment & Plan Note (Signed)
Recommend continue to work on eating healthy diet and exercise.

## 2023-04-05 NOTE — Assessment & Plan Note (Signed)
Continue Adderall XR 30 mg every morning.

## 2023-04-05 NOTE — Assessment & Plan Note (Signed)
The current medical regimen is effective;  continue present plan and medications.  Vraylar 3 mg daily, Paxil 37.5 mg daily and Trazodone 50 mg at bedtime.

## 2023-04-05 NOTE — Assessment & Plan Note (Signed)
Well controlled.  No medication changes recommended. Continue azor to 10/40 mg daily.   Continue healthy diet and exercise.   Labs drawn today

## 2023-04-06 ENCOUNTER — Encounter: Payer: Self-pay | Admitting: Family Medicine

## 2023-04-07 ENCOUNTER — Other Ambulatory Visit: Payer: Self-pay

## 2023-04-07 MED ORDER — METFORMIN HCL 500 MG PO TABS: 500.0000 mg | ORAL_TABLET | Freq: Every day | ORAL | 1 refills | Status: DC

## 2023-04-14 ENCOUNTER — Other Ambulatory Visit: Payer: Self-pay | Admitting: Family Medicine

## 2023-04-14 DIAGNOSIS — F3131 Bipolar disorder, current episode depressed, mild: Secondary | ICD-10-CM

## 2023-04-16 ENCOUNTER — Other Ambulatory Visit: Payer: Self-pay | Admitting: Family Medicine

## 2023-04-16 MED ORDER — AMPHETAMINE-DEXTROAMPHET ER 30 MG PO CP24: 30.0000 mg | ORAL_CAPSULE | ORAL | 0 refills | Status: DC

## 2023-04-16 NOTE — Telephone Encounter (Signed)
 Copied from CRM [id number]. Topic: Clinical - Medication Refill >> Apr 16, 2023  4:03 PM [name] wrote: Most Recent Primary Care Visit:  Provider: Braunschweig, KIRSTEN  Department: [facility]  Visit Type: ACUTE  Date: 04/04/2023  Medication: amphetamine -dextroamphetamine  (ADDERALL XR) 30 MG 24 hr capsule   Has the patient contacted their pharmacy? Yes (Agent: If no, request that the patient contact the pharmacy for the refill. If patient does not wish to contact the pharmacy document the reason why and proceed with request.) (Agent: If yes, when and what did the pharmacy advise?)  Is this the correct pharmacy for this prescription? Yes If no, delete pharmacy and type the correct one.  This is the patient's preferred pharmacy:  [id number] - [place], [place] - [address] [place]  [address] Phone: [phone] Fax: [phone]   Has the prescription been filled recently? No  Is the patient out of the medication? Yes  Has the patient been seen for an appointment in the last year OR does the patient have an upcoming appointment? Yes  Can we respond through MyChart? No  Agent: Please be advised that Rx refills may take up to 3 business days. We ask that you follow-up with your pharmacy.

## 2023-04-28 ENCOUNTER — Ambulatory Visit: Payer: BC Managed Care – PPO | Admitting: Family Medicine

## 2023-05-12 ENCOUNTER — Other Ambulatory Visit: Payer: Self-pay | Admitting: Family Medicine

## 2023-05-12 MED ORDER — AMPHETAMINE-DEXTROAMPHET ER 30 MG PO CP24
30.0000 mg | ORAL_CAPSULE | ORAL | 0 refills | Status: DC
Start: 2023-05-12 — End: 2023-06-09

## 2023-05-12 NOTE — Telephone Encounter (Signed)
 Copied from CRM [id number]. Topic: Clinical - Medication Refill >> May 12, 2023  8:57 AM Marlow Baars wrote: Most Recent Primary Care Visit:  Provider: Nield, KIRSTEN  Department: [facility]  Visit Type: ACUTE  Date: 04/04/2023  Medication: amphetamine-dextroamphetamine (ADDERALL XR) 30 MG 24 hr capsule  Has the patient contacted their pharmacy? Yes   Is this the correct pharmacy for this prescription? Yes If no, delete pharmacy and type the correct one.  This is the patient's preferred pharmacy:  [id number] - [place], [place] - [address] [place] [address] Phone: [phone] Fax: [phone]  [id number] - [place], [place] - [address] AT [facility] [address] [place] [address] Phone: [phone] Fax: [phone]   Has the prescription been filled recently? No  Is the patient out of the medication? No  Has the patient been seen for an appointment in the last year OR does the patient have an upcoming appointment? Yes  Can we respond through MyChart? Yes  Please assist patient further

## 2023-05-18 ENCOUNTER — Encounter: Payer: Self-pay | Admitting: Family Medicine

## 2023-05-26 ENCOUNTER — Ambulatory Visit (INDEPENDENT_AMBULATORY_CARE_PROVIDER_SITE_OTHER)

## 2023-05-26 VITALS — BP 110/78 | HR 81 | Temp 97.6°F | Ht 63.0 in | Wt 260.6 lb

## 2023-05-26 DIAGNOSIS — L235 Allergic contact dermatitis due to other chemical products: Secondary | ICD-10-CM

## 2023-05-26 DIAGNOSIS — L259 Unspecified contact dermatitis, unspecified cause: Secondary | ICD-10-CM | POA: Insufficient documentation

## 2023-05-26 MED ORDER — TRIAMCINOLONE ACETONIDE 40 MG/ML IJ SUSP
40.0000 mg | Freq: Once | INTRAMUSCULAR | Status: AC
  Administered 2023-05-26: 40 mg via INTRAMUSCULAR

## 2023-05-26 MED ORDER — HYDROXYZINE PAMOATE 25 MG PO CAPS: 25.0000 mg | ORAL_CAPSULE | Freq: Three times a day (TID) | ORAL | 0 refills | Status: AC | PRN

## 2023-05-26 MED ORDER — PREDNISONE 20 MG PO TABS: 40.0000 mg | ORAL_TABLET | Freq: Every day | ORAL | 0 refills | Status: AC

## 2023-05-26 NOTE — Assessment & Plan Note (Signed)
 ALLERGIC CONTACT DERMATITIS POSSIBLY DUE TO LAUNDRY DETERGENT CHEMICALS  Extensive macular, erythematous rash on the trunk, back, abdominal wall, legs BUT sparing under the breasts. Likely due to a change in laundry detergent. The rash is severe, causing significant itching and discomfort, especially at night. The condition started on Thursday and worsened over the weekend. Differential diagnosis initially considered a yeast infection, but the presentation is consistent with allergic contact dermatitis. Given the extensive area affected, topical treatment is not feasible. - Administer Kenalog 40 mg IM injection for immediate relief. - Prescribe prednisone 40 mg daily for 5 days to manage inflammation. - Prescribe hydroxyzine capsules, up to three times daily as needed for itching, with a total of 15 pills. - Advise the use of calamine lotion for soothing the skin. - Recommend ice packs on itchy areas for relief. - Instruct to avoid hot baths to prevent further skin irritation. - Suggest using Benadryl as an alternative to hydroxyzine if needed. - Advise washing clothes with familiar detergent to avoid further allergic reactions. - Recommend Pepcid 20 or 40 mg to help with allergies and potential acid reflux due to prednisone.  Pre-diabetes management Currently on metformin due to an elevated A1c of 6.4% as of January 24th. She reports weight loss of 8-9 pounds and denies being diabetic. The short course of prednisone should not significantly impact blood glucose levels, but dietary caution is advised. - Continue monitoring blood glucose levels and A1c. - Advise to maintain a healthy diet and stay hydrated, especially while on prednisone.

## 2023-05-26 NOTE — Progress Notes (Signed)
 Acute Office Visit  Subjective:    Patient ID: Anna [name], female    DOB: May 19, 1970, 53 y.o.   MRN: [id number]  Chief Complaint  Patient presents with   Rash    All over chest    Discussed the use of AI scribe software for clinical note transcription with the patient, who gave verbal consent to proceed.       HPI: Anna [name] is a 53 year old female who presents with a severe rash.  The rash began on Thursday, initially thought to be due to a tight bra, and worsened by Friday, becoming inflamed by Saturday. It is itchy and located on her abdomen, on her legs, and has recently spread to her back.  She suspects the rash may be related to a change in laundry detergent, as her husband recently changed the scent ball in the washer. Wearing clothes washed with the new detergent exacerbates the rash. She has a history of being allergic to certain detergents as a child and has been using Tide without issues until this recent change.  The rash is extensive, covering a large area, making topical treatments impractical. She has been using cortisone cream, which provides relief for about two hours, but the itching, particularly under her breasts, remains severe.  She rates her discomfort as a 'nine or ten' at night, indicating significant impact on her sleep. She wakes up frequently due to itching and has been unable to sleep at night, leading to daytime sleepiness.  No diabetes, but she reports a recent increase in A1c levels. She experiences acid reflux depending on her diet, particularly when eating [place] food.  Past Medical History:  Diagnosis Date   ADD (attention deficit disorder)    Arthritis    Asthma    seasonal   Bipolar affective disorder, current episode depressed (HCC)    GERD (gastroesophageal reflux disease)    occ   History of recurrent UTIs 03/11/2012   Hypertension    Moderate persistent asthma with exacerbation 12/16/2021    Past Surgical History:  Procedure  Laterality Date   ABDOMINAL HYSTERECTOMY  2010   endometriosis. Total hysterectomy   ANTERIOR CERVICAL DECOMP/DISCECTOMY FUSION N/A 07/13/2012   Procedure: ANTERIOR CERVICAL DECOMPRESSION/DISCECTOMY FUSION 1 LEVEL;  Surgeon: Mariam Dollar, MD;  Location: [facility];  Service: Neurosurgery;  Laterality: N/A;  Anterior Cervical Decompression/Discectomy Cervical Three-Four   CARPAL TUNNEL RELEASE Right    CERVICAL DISC SURGERY  09   CHOLECYSTECTOMY      Family History  Problem Relation Age of Onset   Breast cancer Neg Hx     Social History   Socioeconomic History   Marital status: Married    Spouse name: Not on file   Number of children: Not on file   Years of education: Not on file   Highest education level: Not on file  Occupational History   Not on file  Tobacco Use   Smoking status: Never   Smokeless tobacco: Never  Vaping Use   Vaping status: Never Used  Substance and Sexual Activity   Alcohol use: No   Drug use: No    Comment: 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   Sexual activity: Not on file  Other Topics Concern   Not on file  Social History Narrative   Not on file   Social Drivers of Health   Financial Resource Strain: Low Risk  (05/28/2022)   Overall Financial Resource Strain (CARDIA)    Difficulty of Paying  Living Expenses: Not hard at all  Food Insecurity: No Food Insecurity (05/28/2022)   Hunger Vital Sign    Worried About Running Out of Food in the Last Year: Never true    Ran Out of Food in the Last Year: Never true  Transportation Needs: No Transportation Needs (05/28/2022)   PRAPARE - Administrator, Civil Service (Medical): No    Lack of Transportation (Non-Medical): No  Physical Activity: Inactive (09/11/2022)   Exercise Vital Sign    Days of Exercise per Week: 0 days    Minutes of Exercise per Session: 0 min  Stress: No Stress Concern Present (05/28/2022)   [employer] of Occupational Health - Occupational Stress Questionnaire     Feeling of Stress : Not at all  Social Connections: Moderately Integrated (05/28/2022)   Social Connection and Isolation Panel [NHANES]    Frequency of Communication with Friends and Family: More than three times a week    Frequency of Social Gatherings with Friends and Family: Three times a week    Attends Religious Services: More than 4 times per year    Active Member of Clubs or Organizations: No    Attends Banker Meetings: Never    Marital Status: Married  Catering manager Violence: Not At Risk (05/28/2022)   Humiliation, Afraid, Rape, and Kick questionnaire    Fear of Current or Ex-Partner: No    Emotionally Abused: No    Physically Abused: No    Sexually Abused: No    Outpatient Medications Prior to Visit  Medication Sig Dispense Refill   albuterol (VENTOLIN HFA) 108 (90 Base) MCG/ACT inhaler Inhale 2 puffs into the lungs every 6 (six) hours as needed for wheezing or shortness of breath. 18 g 1   Albuterol-Budesonide (AIRSUPRA) 90-80 MCG/ACT AERO Inhale 90 mcg into the lungs every 6 (six) hours as needed for up to 28 doses. 5.9 g 1   ALPRAZolam (XANAX) 0.5 MG tablet TAKE 1 TABLET BY MOUTH DAILY AS NEEDED FOR SEVERE ANXIETY 30 tablet 2   amLODipine-olmesartan (AZOR) 10-40 MG tablet TAKE 1 TABLET BY MOUTH EVERY DAY 90 tablet 1   amphetamine-dextroamphetamine (ADDERALL XR) 30 MG 24 hr capsule Take 1 capsule (30 mg total) by mouth every morning. 30 capsule 0   cariprazine (VRAYLAR) 1.5 MG capsule Take 1 capsule (1.5 mg total) by mouth daily. 30 capsule 2   cyclobenzaprine (FLEXERIL) 5 MG tablet Take 1 tablet (5 mg total) by mouth 2 (two) times daily as needed for muscle spasms. 180 tablet 0   fluconazole (DIFLUCAN) 100 MG tablet Take 1 tablet (100 mg total) by mouth daily. 7 tablet 0   fluticasone (FLONASE) 50 MCG/ACT nasal spray Place 2 sprays into both nostrils daily. 18 mL 2   metFORMIN (GLUCOPHAGE) 500 MG tablet Take 1 tablet (500 mg total) by mouth daily with breakfast.  90 tablet 1   nystatin-triamcinolone ointment (MYCOLOG) Apply 1 Application topically 2 (two) times daily. 30 g 0   omega-3 acid ethyl esters (LOVAZA) 1 g capsule TAKE 1 CAPSULE BY MOUTH TWICE A DAY 180 capsule 0   ondansetron (ZOFRAN) 4 MG tablet Take 1 tablet (4 mg total) by mouth every 8 (eight) hours as needed for nausea or vomiting. 20 tablet 0   PARoxetine (PAXIL-CR) 37.5 MG 24 hr tablet TAKE 1 TABLET BY MOUTH EVERY DAY 90 tablet 1   rosuvastatin (CRESTOR) 20 MG tablet TAKE 1 TABLET BY MOUTH EVERY DAY 90 tablet 1   traZODone (DESYREL)  50 MG tablet TAKE 1 TABLET BY MOUTH EVERYDAY AT BEDTIME 90 tablet 3   triamcinolone cream (KENALOG) 0.1 % Apply 1 application. topically 2 (two) times daily. 45 g 1   valACYclovir (VALTREX) 500 MG tablet Take 1 tablet (500 mg total) by mouth 2 (two) times daily. 14 tablet 1   Vitamin D, Ergocalciferol, (DRISDOL) 1.25 MG (50000 UNIT) CAPS capsule TAKE 1 CAPSULE (50,000 UNITS TOTAL) BY MOUTH TWO TIMES A WEEK 24 capsule 0   No facility-administered medications prior to visit.    Allergies  Allergen Reactions   Avelox [Moxifloxacin Hcl In Nacl] Anaphylaxis   Lactose Intolerance (Gi) Diarrhea   Tape Other (See Comments)    If tape on a while will pull off skin    Review of Systems  Constitutional:  Negative for chills, fatigue and fever.  HENT:  Negative for congestion, ear pain and sinus pain.   Respiratory:  Negative for cough and shortness of breath.   Cardiovascular:  Negative for chest pain.  Gastrointestinal:  Negative for abdominal pain, constipation, diarrhea, nausea and vomiting.  Musculoskeletal:  Negative for myalgias.  Skin:  Positive for rash.  Neurological:  Negative for headaches.       Objective:        05/26/2023    3:44 PM 04/04/2023    7:36 AM 03/18/2023    1:23 PM  Vitals with BMI  Height 5\' 3"  5\' 3"  5\' 3"   Weight 260 lbs 10 oz 257 lbs 13 oz 255 lbs  BMI 46.17 45.68 45.18  Systolic 110 130 629  Diastolic 78 88 70  Pulse  81 71 99    No data found.   Physical Exam Vitals and nursing note reviewed.  Constitutional:      Appearance: She is obese.  Cardiovascular:     Rate and Rhythm: Normal rate and regular rhythm.  Musculoskeletal:        General: Normal range of motion.  Skin:    Findings: Rash present.     Comments: Erythematous macular rash scattered all over her body  Neurological:     Mental Status: She is alert.          Health Maintenance Due  Topic Date Due   Fecal DNA (Cologuard)  Never done    There are no preventive care reminders to display for this patient.   Lab Results  Component Value Date   TSH 1.000 03/29/2021   Lab Results  Component Value Date   WBC 8.9 04/04/2023   HGB 13.0 04/04/2023   HCT 39.8 04/04/2023   MCV 92 04/04/2023   PLT 315 04/04/2023   Lab Results  Component Value Date   NA 142 04/04/2023   K 4.5 04/04/2023   CO2 24 04/04/2023   GLUCOSE 110 (H) 04/04/2023   BUN 7 04/04/2023   CREATININE 1.04 (H) 04/04/2023   BILITOT 0.3 04/04/2023   ALKPHOS 91 04/04/2023   AST 15 04/04/2023   ALT 12 04/04/2023   PROT 6.4 04/04/2023   ALBUMIN 3.9 04/04/2023   CALCIUM 8.9 04/04/2023   EGFR 64 04/04/2023   Lab Results  Component Value Date   CHOL 193 01/02/2023   Lab Results  Component Value Date   HDL 74 01/02/2023   Lab Results  Component Value Date   LDLCALC 105 (H) 01/02/2023   Lab Results  Component Value Date   TRIG 75 01/02/2023   Lab Results  Component Value Date   CHOLHDL 2.6 01/02/2023   Lab Results  Component Value Date   HGBA1C 6.4 (H) 04/04/2023       Assessment & Plan:  Allergic dermatitis due to other chemical product Assessment & Plan: ALLERGIC CONTACT DERMATITIS POSSIBLY DUE TO LAUNDRY DETERGENT CHEMICALS  Extensive macular, erythematous rash on the trunk, back, abdominal wall, legs BUT sparing under the breasts. Likely due to a change in laundry detergent. The rash is severe, causing significant itching and  discomfort, especially at night. The condition started on Thursday and worsened over the weekend. Differential diagnosis initially considered a yeast infection, but the presentation is consistent with allergic contact dermatitis. Given the extensive area affected, topical treatment is not feasible. - Administer Kenalog 40 mg IM injection for immediate relief. - Prescribe prednisone 40 mg daily for 5 days to manage inflammation. - Prescribe hydroxyzine capsules, up to three times daily as needed for itching, with a total of 15 pills. - Advise the use of calamine lotion for soothing the skin. - Recommend ice packs on itchy areas for relief. - Instruct to avoid hot baths to prevent further skin irritation. - Suggest using Benadryl as an alternative to hydroxyzine if needed. - Advise washing clothes with familiar detergent to avoid further allergic reactions. - Recommend Pepcid 20 or 40 mg to help with allergies and potential acid reflux due to prednisone.  Pre-diabetes management Currently on metformin due to an elevated A1c of 6.4% as of January 24th. She reports weight loss of 8-9 pounds and denies being diabetic. The short course of prednisone should not significantly impact blood glucose levels, but dietary caution is advised. - Continue monitoring blood glucose levels and A1c. - Advise to maintain a healthy diet and stay hydrated, especially while on prednisone.  Orders: -     Triamcinolone Acetonide  Other orders -     predniSONE; Take 2 tablets (40 mg total) by mouth daily with breakfast for 5 days.  Dispense: 10 tablet; Refill: 0 -     hydrOXYzine Pamoate; Take 1 capsule (25 mg total) by mouth every 8 (eight) hours as needed for up to 5 days for itching.  Dispense: 15 capsule; Refill: 0     Assessment and Plan       Meds ordered this encounter  Medications   predniSONE (DELTASONE) 20 MG tablet    Sig: Take 2 tablets (40 mg total) by mouth daily with breakfast for 5 days.     Dispense:  10 tablet    Refill:  0   hydrOXYzine (VISTARIL) 25 MG capsule    Sig: Take 1 capsule (25 mg total) by mouth every 8 (eight) hours as needed for up to 5 days for itching.    Dispense:  15 capsule    Refill:  0   triamcinolone acetonide (KENALOG-40) injection 40 mg    No orders of the defined types were placed in this encounter.    Follow-up: Return if symptoms worsen or fail to improve.  An After Visit Summary was printed and given to the patient.  Windell Moment, MD [facility] [phone]

## 2023-05-26 NOTE — Patient Instructions (Signed)
 VISIT SUMMARY:  Today, we addressed your severe rash, which has worsened over the past few days and is likely due to a change in laundry detergent. We also reviewed your diabetes management and provided recommendations to help manage your blood glucose levels while on medication for the rash.  YOUR PLAN:  -ALLERGIC CONTACT DERMATITIS: Allergic contact dermatitis is a skin reaction that occurs when you come into contact with a substance that causes an allergic response. In your case, it is likely due to a change in laundry detergent. We administered a Kenalog injection for immediate relief and prescribed prednisone for 5 days to reduce inflammation. You can take hydroxyzine capsules up to three times daily for itching, and use calamine lotion and ice packs to soothe your skin. Avoid hot baths and wash your clothes with your usual detergent. You may also use Benadryl if needed. Additionally, Pepcid can help with allergies and potential acid reflux from prednisone.  -DIABETES MANAGEMENT: Your elevated A1c level indicates that you are at risk for diabetes, and you are currently managing it with metformin. The short course of prednisone should not significantly affect your blood glucose levels, but continue to monitor them and maintain a healthy diet. Stay hydrated, especially while on prednisone.  INSTRUCTIONS:  Please follow up with [place] if your symptoms do not improve or if you experience any side effects from the medications. Continue to monitor your blood glucose levels and maintain a healthy diet. If you have any questions or concerns, do not hesitate to contact our office.

## 2023-06-09 ENCOUNTER — Other Ambulatory Visit: Payer: Self-pay | Admitting: Family Medicine

## 2023-06-09 NOTE — Telephone Encounter (Unsigned)
 Copied from CRM [id number]. Topic: Clinical - Medication Refill >> Jun 09, 2023  1:58 PM Gery Pray wrote: Most Recent Primary Care Visit:  Provider: Windell Moment  Department: [facility]  Visit Type: OFFICE VISIT  Date: 05/26/2023  Medication: amphetamine-dextroamphetamine (ADDERALL XR) 30 MG 24 hr capsule   Has the patient contacted their pharmacy? Yes (Agent: If no, request that the patient contact the pharmacy for the refill. If patient does not wish to contact the pharmacy document the reason why and proceed with request.) (Agent: If yes, when and what did the pharmacy advise?) Pharmacy stated they were not able to call it in   Is this the correct pharmacy for this prescription? Yes If no, delete pharmacy and type the correct one.  This is the patient's preferred pharmacy:  [id number] - [place], [place] - [address] [place] [address] Phone: [phone] Fax: [phone]  Has the prescription been filled recently? No  Is the patient out of the medication? No  Has the patient been seen for an appointment in the last year OR does the patient have an upcoming appointment? Yes  Can we respond through MyChart? Yes  Agent: Please be advised that Rx refills may take up to 3 business days. We ask that you follow-up with your pharmacy.

## 2023-06-10 MED ORDER — AMPHETAMINE-DEXTROAMPHET ER 30 MG PO CP24: 30.0000 mg | ORAL_CAPSULE | ORAL | 0 refills | Status: DC

## 2023-06-16 ENCOUNTER — Telehealth: Payer: Self-pay | Admitting: Family Medicine

## 2023-06-16 ENCOUNTER — Other Ambulatory Visit: Payer: Self-pay | Admitting: Family Medicine

## 2023-06-16 NOTE — Telephone Encounter (Signed)
 Copied from CRM [id number]. Topic: Clinical - Prescription Issue >> Jun 16, 2023  7:34 AM Anna [name] wrote: Reason for CRM: Patient is calling in regards to her amphetamine-dextroamphetamine (ADDERALL XR) 30 MG 24 hr capsule, patient is calling to see if provider will call in her refill 1 day early. Prescription sent to pharmacy on 4/1 can't be released to patient due to early refill, please reach out to patient, thanks.  Anna [name] [phone]

## 2023-06-16 NOTE — Telephone Encounter (Signed)
 Patient is calling back to see if the provider will call in and allow the pharmacy to fill her script for amphetamine-dextroamphetamine (ADDERALL XR) 30 MG 24 hr capsule a day early so she does not have to take off from work. Please f/u with patient

## 2023-06-18 ENCOUNTER — Other Ambulatory Visit: Payer: Self-pay | Admitting: Family Medicine

## 2023-07-05 ENCOUNTER — Other Ambulatory Visit: Payer: Self-pay | Admitting: Family Medicine

## 2023-07-07 ENCOUNTER — Other Ambulatory Visit: Payer: Self-pay | Admitting: Family Medicine

## 2023-07-07 DIAGNOSIS — M542 Cervicalgia: Secondary | ICD-10-CM

## 2023-07-08 NOTE — Progress Notes (Unsigned)
 Subjective:  Patient ID: Anna [name], female    DOB: January 26, 1971  Age: 53 y.o. MRN: [id number]  Chief Complaint  Patient presents with   Medical Management of Chronic Issues    HPI: Hypertension: She is taking Amlodipine -Olmesartan  10/40 mg daily.  BP today:  Hyperlipidemia: Taking Rosuvastatin  20 mg daily, Lovaza  1 g one capsule twice a day.  ADHD: She takes Adderal xr 30 once daily.  Bipolar disorder: She is on Paxil  CR 37.5 mg one before bed, vraylar  1.5 mg once daily in am, and trazodone  50 mg before bed. Patient is feeling overwhelmed because of her parents health. Her mother has dementia.   PTSD: Alprazolam  0.5 mg  daily PRN for severe anxiety.  Vitamin D  deficiency: taking vitamin D  50K twice weekly.   Prediabetes: A1C 6.4%. metformin  500 mg once daily.      07/09/2023    7:50 AM 05/26/2023    3:45 PM 04/04/2023    7:37 AM 03/18/2023    1:23 PM 09/11/2022    7:51 AM  Depression screen PHQ 2/9  Decreased Interest 2 0 0 0 0  Down, Depressed, Hopeless 1 0 0 0 0  PHQ - 2 Score 3 0 0 0 0  Altered sleeping 3   0 0  Tired, decreased energy 0   0 0  Change in appetite 0   0 0  Feeling bad or failure about yourself  0   0 0  Trouble concentrating 0   0 0  Moving slowly or fidgety/restless 0   0 0  Suicidal thoughts 0   0 0  PHQ-9 Score 6   0 0  Difficult doing work/chores Not difficult at all   Not difficult at all Not difficult at all        05/26/2023    3:45 PM  Fall Risk   Falls in the past year? 0  Number falls in past yr: 0  Injury with Fall? 0  Risk for fall due to : No Fall Risks      07/09/2023    7:51 AM 03/18/2023    1:23 PM 09/11/2022    7:51 AM  GAD 7 : Generalized Anxiety Score  Nervous, Anxious, on Edge 2 0 0  Control/stop worrying 0 0 0  Worry too much - different things 0 0 0  Trouble relaxing 0 0 0  Restless 2 0 0  Easily annoyed or irritable 0 0 0  Afraid - awful might happen 0 0 0  Total GAD 7 Score 4 0 0  Anxiety Difficulty Not difficult at  all Not difficult at all Not difficult at all      Patient Care Team: Mercy Stall, MD as PCP - General (Family Medicine)   Review of Systems  Constitutional:  Negative for chills, fatigue and fever.  HENT:  Negative for congestion, ear pain, rhinorrhea and sore throat.   Respiratory:  Negative for cough and shortness of breath.   Cardiovascular:  Negative for chest pain.  Gastrointestinal:  Positive for diarrhea. Negative for abdominal pain, constipation, nausea and vomiting.  Genitourinary:  Negative for dysuria and urgency.  Musculoskeletal:  Negative for back pain and myalgias.  Neurological:  Negative for dizziness, weakness, light-headedness and headaches.  Psychiatric/Behavioral:  Negative for dysphoric mood. The patient is not nervous/anxious.     Current Outpatient Medications on File Prior to Visit  Medication Sig Dispense Refill   albuterol  (VENTOLIN  HFA) 108 (90 Base) MCG/ACT inhaler Inhale 2 puffs into  the lungs every 6 (six) hours as needed for wheezing or shortness of breath. 18 g 1   Albuterol -Budesonide (AIRSUPRA ) 90-80 MCG/ACT AERO Inhale 90 mcg into the lungs every 6 (six) hours as needed for up to 28 doses. 5.9 g 1   ALPRAZolam  (XANAX ) 0.5 MG tablet TAKE 1 TABLET BY MOUTH DAILY AS NEEDED FOR SEVERE ANXIETY 30 tablet 2   amLODipine -olmesartan  (AZOR ) 10-40 MG tablet TAKE 1 TABLET BY MOUTH EVERY DAY 90 tablet 1   cariprazine  (VRAYLAR ) 1.5 MG capsule Take 1 capsule (1.5 mg total) by mouth daily. 30 capsule 2   cyclobenzaprine  (FLEXERIL ) 5 MG tablet TAKE 1 TABLET BY MOUTH THREE TIMES A DAY AS NEEDED FOR MUSCLE SPASM 60 tablet 1   fluticasone  (FLONASE ) 50 MCG/ACT nasal spray Place 2 sprays into both nostrils daily. 18 mL 2   metFORMIN  (GLUCOPHAGE ) 500 MG tablet Take 1 tablet (500 mg total) by mouth daily with breakfast. 90 tablet 1   nystatin -triamcinolone  ointment (MYCOLOG) Apply 1 Application topically 2 (two) times daily. 30 g 0   omega-3 acid ethyl esters (LOVAZA ) 1 g  capsule TAKE 1 CAPSULE BY MOUTH TWICE A DAY 180 capsule 0   ondansetron  (ZOFRAN ) 4 MG tablet Take 1 tablet (4 mg total) by mouth every 8 (eight) hours as needed for nausea or vomiting. 20 tablet 0   PARoxetine  (PAXIL -CR) 37.5 MG 24 hr tablet TAKE 1 TABLET BY MOUTH EVERY DAY 90 tablet 1   rosuvastatin  (CRESTOR ) 20 MG tablet TAKE 1 TABLET BY MOUTH EVERY DAY 90 tablet 1   traZODone  (DESYREL ) 50 MG tablet TAKE 1 TABLET BY MOUTH EVERYDAY AT BEDTIME 90 tablet 3   triamcinolone  cream (KENALOG ) 0.1 % Apply 1 application. topically 2 (two) times daily. 45 g 1   valACYclovir  (VALTREX ) 500 MG tablet Take 1 tablet (500 mg total) by mouth 2 (two) times daily. 14 tablet 1   Vitamin D , Ergocalciferol , (DRISDOL ) 1.25 MG (50000 UNIT) CAPS capsule TAKE 1 CAPSULE (50,000 UNITS TOTAL) BY MOUTH TWO TIMES A WEEK 24 capsule 0   No current facility-administered medications on file prior to visit.   Past Medical History:  Diagnosis Date   ADD (attention deficit disorder)    Arthritis    Asthma    seasonal   Bipolar affective disorder, current episode depressed (HCC)    GERD (gastroesophageal reflux disease)    occ   History of recurrent UTIs 03/11/2012   Hypertension    Moderate persistent asthma with exacerbation 12/16/2021   Past Surgical History:  Procedure Laterality Date   ABDOMINAL HYSTERECTOMY  2010   endometriosis. Total hysterectomy   ANTERIOR CERVICAL DECOMP/DISCECTOMY FUSION N/A 07/13/2012   Procedure: ANTERIOR CERVICAL DECOMPRESSION/DISCECTOMY FUSION 1 LEVEL;  Surgeon: Ferris Hua, MD;  Location: [facility];  Service: Neurosurgery;  Laterality: N/A;  Anterior Cervical Decompression/Discectomy Cervical Three-Four   CARPAL TUNNEL RELEASE Right    CERVICAL DISC SURGERY  09   CHOLECYSTECTOMY      Family History  Problem Relation Age of Onset   Breast cancer Neg Hx    Social History   Socioeconomic History   Marital status: Married    Spouse name: Not on file   Number of children: Not on  file   Years of education: Not on file   Highest education level: Not on file  Occupational History   Not on file  Tobacco Use   Smoking status: Never   Smokeless tobacco: Never  Vaping Use   Vaping status: Never Used  Substance and Sexual Activity   Alcohol use: No   Drug use: No    Comment: 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   Sexual activity: Not on file  Other Topics Concern   Not on file  Social History Narrative   Not on file   Social Drivers of Health   Financial Resource Strain: Low Risk  (07/09/2023)   Overall Financial Resource Strain (CARDIA)    Difficulty of Paying Living Expenses: Not hard at all  Food Insecurity: No Food Insecurity (07/09/2023)   Hunger Vital Sign    Worried About Running Out of Food in the Last Year: Never true    Ran Out of Food in the Last Year: Never true  Transportation Needs: No Transportation Needs (07/09/2023)   PRAPARE - Administrator, Civil Service (Medical): No    Lack of Transportation (Non-Medical): No  Physical Activity: Inactive (07/09/2023)   Exercise Vital Sign    Days of Exercise per Week: 0 days    Minutes of Exercise per Session: 0 min  Stress: No Stress Concern Present (07/09/2023)   [employer] of Occupational Health - Occupational Stress Questionnaire    Feeling of Stress : Not at all  Social Connections: Moderately Integrated (07/09/2023)   Social Connection and Isolation Panel [NHANES]    Frequency of Communication with Friends and Family: More than three times a week    Frequency of Social Gatherings with Friends and Family: Three times a week    Attends Religious Services: More than 4 times per year    Active Member of Clubs or Organizations: No    Attends Banker Meetings: Never    Marital Status: Married    Objective:  BP 132/72   Pulse 91   Temp 97.8 F (36.6 C)   Ht 5\' 3"  (1.6 m)   Wt 264 lb (119.7 kg)   SpO2 95%   BMI 46.77 kg/m      07/09/2023    7:44 AM 05/26/2023     3:44 PM 04/04/2023    7:36 AM  BP/Weight  Systolic BP 132 110 130  Diastolic BP 72 78 88  Wt. (Lbs) 264 260.6 257.8  BMI 46.77 kg/m2 46.16 kg/m2 45.67 kg/m2    Physical Exam Vitals reviewed.  Constitutional:      Appearance: Normal appearance. She is obese.  Neck:     Vascular: No carotid bruit.  Cardiovascular:     Rate and Rhythm: Normal rate and regular rhythm.     Heart sounds: Normal heart sounds.  Pulmonary:     Effort: Pulmonary effort is normal. No respiratory distress.     Breath sounds: Normal breath sounds.  Abdominal:     General: Abdomen is flat. Bowel sounds are normal.     Palpations: Abdomen is soft.     Tenderness: There is no abdominal tenderness.  Neurological:     Mental Status: She is alert and oriented to person, place, and time.  Psychiatric:        Mood and Affect: Mood normal.        Behavior: Behavior normal.     Diabetic Foot Exam - Simple   No data filed      Lab Results  Component Value Date   WBC 8.9 04/04/2023   HGB 13.0 04/04/2023   HCT 39.8 04/04/2023   PLT 315 04/04/2023   GLUCOSE 110 (H) 04/04/2023   CHOL 193 01/02/2023   TRIG 75 01/02/2023   HDL 74 01/02/2023  LDLCALC 105 (H) 01/02/2023   ALT 12 04/04/2023   AST 15 04/04/2023   NA 142 04/04/2023   K 4.5 04/04/2023   CL 102 04/04/2023   CREATININE 1.04 (H) 04/04/2023   BUN 7 04/04/2023   CO2 24 04/04/2023   TSH 1.000 03/29/2021   HGBA1C 6.4 (H) 04/04/2023      Assessment & Plan:  Essential hypertension, benign Assessment & Plan: Well controlled.  No medication changes recommended. Continue azor  to 10/40 mg daily.   Continue healthy diet and exercise.   Labs drawn today  Orders: -     CBC with Differential/Platelet -     Comprehensive metabolic panel with GFR  Prediabetes Assessment & Plan: The current medical regimen is effective;  continue present plan and medications. Continue metformin  500 mg daily.  Recommend continue to work on eating healthy diet  and exercise.   Orders: -     Hemoglobin A1c  Mixed hyperlipidemia Assessment & Plan: Well controlled.  No changes to medicines.  Continue rosuvastatin  20 mg daily.  Continue Lovaza  1 g 1 capsule twice daily.  Continue to work on eating a healthy diet and exercise.  Labs drawn today.   Orders: -     Lipid panel  Bipolar 1 disorder, depressed, mild (HCC) Assessment & Plan: The current medical regimen is effective;  continue present plan and medications.  Vraylar  1.5 mg daily, Paxil  37.5 mg daily and Trazodone  50 mg at bedtime.   Morbid obesity (HCC) Assessment & Plan: Recommend continue to work on eating healthy diet and exercise.    Attention deficit hyperactivity disorder (ADHD), combined type Assessment & Plan: Well controlled.  Continue Adderall XR 30 mg every morning.    Posttraumatic stress disorder Assessment & Plan: Continue paxil  cr, vraylar , and trazodone . Continue xanax  as needed.    Vitamin D  deficiency Assessment & Plan: Continue vitamin D  50K  twice weekly.   Colon cancer screening -     Cologuard  Encounter for immunization -     Pneumococcal conjugate vaccine 20-valent  Other orders -     Amphetamine -Dextroamphet ER; Take 1 capsule (30 mg total) by mouth every morning.  Dispense: 30 capsule; Refill: 0     Meds ordered this encounter  Medications   amphetamine -dextroamphetamine  (ADDERALL XR) 30 MG 24 hr capsule    Sig: Take 1 capsule (30 mg total) by mouth every morning.    Dispense:  30 capsule    Refill:  0    Orders Placed This Encounter  Procedures   Pneumococcal conjugate vaccine 20-valent   CBC with Differential/Platelet   Comprehensive metabolic panel with GFR   Hemoglobin A1c   Lipid panel   Cologuard     Follow-up: No follow-ups on file.   I,Marla I Leal-Borjas,acting as a scribe for Mercy Stall, MD.,have documented all relevant documentation on the behalf of Mercy Stall, MD,as directed by  Mercy Stall, MD while in the  presence of Mercy Stall, MD.   An After Visit Summary was printed and given to the patient.  Mercy Stall, MD [facility] [phone]

## 2023-07-09 ENCOUNTER — Encounter: Payer: Self-pay | Admitting: Family Medicine

## 2023-07-09 ENCOUNTER — Ambulatory Visit: Payer: BC Managed Care – PPO | Admitting: Family Medicine

## 2023-07-09 VITALS — BP 132/72 | HR 91 | Temp 97.8°F | Ht 63.0 in | Wt 264.0 lb

## 2023-07-09 DIAGNOSIS — R7303 Prediabetes: Secondary | ICD-10-CM

## 2023-07-09 DIAGNOSIS — Z23 Encounter for immunization: Secondary | ICD-10-CM

## 2023-07-09 DIAGNOSIS — Z1211 Encounter for screening for malignant neoplasm of colon: Secondary | ICD-10-CM

## 2023-07-09 DIAGNOSIS — I10 Essential (primary) hypertension: Secondary | ICD-10-CM | POA: Diagnosis not present

## 2023-07-09 DIAGNOSIS — E782 Mixed hyperlipidemia: Secondary | ICD-10-CM

## 2023-07-09 DIAGNOSIS — F3131 Bipolar disorder, current episode depressed, mild: Secondary | ICD-10-CM

## 2023-07-09 DIAGNOSIS — F4310 Post-traumatic stress disorder, unspecified: Secondary | ICD-10-CM

## 2023-07-09 DIAGNOSIS — F902 Attention-deficit hyperactivity disorder, combined type: Secondary | ICD-10-CM

## 2023-07-09 DIAGNOSIS — K219 Gastro-esophageal reflux disease without esophagitis: Secondary | ICD-10-CM

## 2023-07-09 DIAGNOSIS — E559 Vitamin D deficiency, unspecified: Secondary | ICD-10-CM

## 2023-07-09 MED ORDER — AMPHETAMINE-DEXTROAMPHET ER 30 MG PO CP24: 30.0000 mg | ORAL_CAPSULE | ORAL | 0 refills | Status: DC

## 2023-07-09 NOTE — Assessment & Plan Note (Signed)
 Well controlled.  No medication changes recommended. Continue azor to 10/40 mg daily.   Continue healthy diet and exercise.   Labs drawn today

## 2023-07-09 NOTE — Assessment & Plan Note (Signed)
Continue vitamin D 50 K twice weekly.  

## 2023-07-09 NOTE — Assessment & Plan Note (Signed)
Continue paxil cr, vraylar, and trazodone. Continue xanax as needed.

## 2023-07-09 NOTE — Assessment & Plan Note (Signed)
 Well controlled.  Continue Adderall XR 30 mg every morning.

## 2023-07-09 NOTE — Assessment & Plan Note (Signed)
 Recommend continue to work on eating healthy diet and exercise.

## 2023-07-09 NOTE — Assessment & Plan Note (Signed)
 The current medical regimen is effective;  continue present plan and medications.  Vraylar  1.5 mg daily, Paxil  37.5 mg daily and Trazodone  50 mg at bedtime.

## 2023-07-09 NOTE — Assessment & Plan Note (Signed)
 The current medical regimen is effective;  continue present plan and medications. Continue metformin  500 mg daily.  Recommend continue to work on eating healthy diet and exercise.

## 2023-07-09 NOTE — Assessment & Plan Note (Signed)
 Well controlled.  No changes to medicines.  Continue rosuvastatin 20 mg daily.  Continue Lovaza 1 g 1 capsule twice daily.  Continue to work on eating a healthy diet and exercise.  Labs drawn today.

## 2023-07-10 ENCOUNTER — Other Ambulatory Visit: Payer: Self-pay

## 2023-07-10 ENCOUNTER — Encounter: Payer: Self-pay | Admitting: Family Medicine

## 2023-07-10 DIAGNOSIS — E119 Type 2 diabetes mellitus without complications: Secondary | ICD-10-CM

## 2023-07-10 HISTORY — DX: Type 2 diabetes mellitus without complications: E11.9

## 2023-07-10 LAB — COMPREHENSIVE METABOLIC PANEL WITH GFR
ALT: 20 IU/L (ref 0–32)
AST: 20 IU/L (ref 0–40)
Albumin: 4.1 g/dL (ref 3.8–4.9)
Alkaline Phosphatase: 93 IU/L (ref 44–121)
BUN/Creatinine Ratio: 10 (ref 9–23)
BUN: 10 mg/dL (ref 6–24)
Bilirubin Total: 0.5 mg/dL (ref 0.0–1.2)
CO2: 26 mmol/L (ref 20–29)
Calcium: 9.1 mg/dL (ref 8.7–10.2)
Chloride: 100 mmol/L (ref 96–106)
Creatinine, Ser: 1.01 mg/dL — ABNORMAL HIGH (ref 0.57–1.00)
Globulin, Total: 2.1 g/dL (ref 1.5–4.5)
Glucose: 120 mg/dL — ABNORMAL HIGH (ref 70–99)
Potassium: 4.2 mmol/L (ref 3.5–5.2)
Sodium: 139 mmol/L (ref 134–144)
Total Protein: 6.2 g/dL (ref 6.0–8.5)
eGFR: 66 mL/min/{1.73_m2} (ref >59)

## 2023-07-10 LAB — CBC WITH DIFFERENTIAL/PLATELET
Basophils Absolute: 0.1 10*3/uL (ref 0.0–0.2)
Basos: 1 %
EOS (ABSOLUTE): 0.1 10*3/uL (ref 0.0–0.4)
Eos: 1 %
Hematocrit: 35.4 % (ref 34.0–46.6)
Hemoglobin: 11.7 g/dL (ref 11.1–15.9)
Immature Grans (Abs): 0.1 10*3/uL (ref 0.0–0.1)
Immature Granulocytes: 1 %
Lymphocytes Absolute: 3 10*3/uL (ref 0.7–3.1)
Lymphs: 32 %
MCH: 30.2 pg (ref 26.6–33.0)
MCHC: 33.1 g/dL (ref 31.5–35.7)
MCV: 91 fL (ref 79–97)
Monocytes Absolute: 0.7 10*3/uL (ref 0.1–0.9)
Monocytes: 7 %
Neutrophils Absolute: 5.6 10*3/uL (ref 1.4–7.0)
Neutrophils: 58 %
Platelets: 262 10*3/uL (ref 150–450)
RBC: 3.88 x10E6/uL (ref 3.77–5.28)
RDW: 12.7 % (ref 11.7–15.4)
WBC: 9.5 10*3/uL (ref 3.4–10.8)

## 2023-07-10 LAB — HEMOGLOBIN A1C
Est. average glucose Bld gHb Est-mCnc: 151 mg/dL
Hgb A1c MFr Bld: 6.9 % — ABNORMAL HIGH (ref 4.8–5.6)

## 2023-07-10 LAB — LIPID PANEL
Chol/HDL Ratio: 3.1 ratio (ref 0.0–4.4)
Cholesterol, Total: 190 mg/dL (ref 100–199)
HDL: 62 mg/dL (ref >39)
LDL Chol Calc (NIH): 112 mg/dL — ABNORMAL HIGH (ref 0–99)
Triglycerides: 87 mg/dL (ref 0–149)
VLDL Cholesterol Cal: 16 mg/dL (ref 5–40)

## 2023-07-10 MED ORDER — TIRZEPATIDE 2.5 MG/0.5ML ~~LOC~~ SOAJ: 2.5000 mg | SUBCUTANEOUS | 0 refills | Status: DC

## 2023-07-11 ENCOUNTER — Telehealth: Payer: Self-pay

## 2023-07-11 NOTE — Telephone Encounter (Signed)
 Copied from CRM [id number]. Topic: General - Other >> Jul 10, 2023  4:35 PM [place] B wrote: Reason for CRM: pt called in, states needs PA for [employer] 

## 2023-07-24 ENCOUNTER — Other Ambulatory Visit: Payer: Self-pay | Admitting: Family Medicine

## 2023-07-24 DIAGNOSIS — F3131 Bipolar disorder, current episode depressed, mild: Secondary | ICD-10-CM

## 2023-08-08 ENCOUNTER — Other Ambulatory Visit: Payer: Self-pay | Admitting: Family Medicine

## 2023-08-09 ENCOUNTER — Other Ambulatory Visit: Payer: Self-pay | Admitting: Family Medicine

## 2023-08-09 MED ORDER — TIRZEPATIDE 5 MG/0.5ML ~~LOC~~ SOAJ: 5.0000 mg | SUBCUTANEOUS | 2 refills | Status: DC

## 2023-08-09 MED ORDER — AMPHETAMINE-DEXTROAMPHET ER 30 MG PO CP24: 30.0000 mg | ORAL_CAPSULE | ORAL | 0 refills | Status: DC

## 2023-08-20 ENCOUNTER — Ambulatory Visit: Payer: Self-pay | Admitting: *Deleted

## 2023-08-20 NOTE — Telephone Encounter (Signed)
 Copied from CRM [id number]. Topic: Clinical - Red Word Triage >> Aug 20, 2023  3:14 PM Minus Amel G wrote: Wheezing,coughing,sinus pressure Reason for Disposition  [1] Sinus pain (not just congestion) AND [2] fever  Answer Assessment - Initial Assessment Questions 1. I'm having sinus pressure, coughing, wheezing for 2 days.   Coughing up clear stuff  Where does it hurt?  Pressure in my sinus area and my teeth hurt      2. ONSET: When did the sinus pain start?  (e.g., hours, days)      2 days ago 3. SEVERITY: How bad is the pain?   (Scale 1-10; mild, moderate or severe)   - MILD (1-3): doesn't interfere with normal activities    - MODERATE (4-7): interferes with normal activities (e.g., work or school) or awakens from sleep   - SEVERE (8-10): excruciating pain and patient unable to do any normal activities        My teeth are hurting too from the sinus pressure 4. RECURRENT SYMPTOM: Have you ever had sinus problems before? If Yes, ask: When was the last time? and What happened that time?      Not asked 5. NASAL CONGESTION: Is the nose blocked? If Yes, ask: Can you open it or must you breathe through your mouth?     Yes 6. NASAL DISCHARGE: Do you have discharge from your nose? If so ask, What color?     clear 7. FEVER: Do you have a fever? If Yes, ask: What is it, how was it measured, and when did it start?      Not asked 8. OTHER SYMPTOMS: Do you have any other symptoms? (e.g., sore throat, cough, earache, difficulty breathing)     See above 9. PREGNANCY: Is there any chance you are pregnant? When was your last menstrual period?     Not asked  Protocols used: Sinus Pain or Congestion-A-AH FYI Only or Action Required?: FYI only for provider  Patient was last seen in primary care on 07/09/2023 by Mercy Stall, MD. Called Nurse Triage reporting Sinusitis. Symptoms began several days ago. Interventions attempted: Nothing. Symptoms are: gradually worsening.  Triage  Disposition: See Physician Within 24 Hours  Patient/caregiver understands and will follow disposition?: Yes

## 2023-08-22 ENCOUNTER — Ambulatory Visit: Admitting: Family Medicine

## 2023-08-22 ENCOUNTER — Encounter: Payer: Self-pay | Admitting: Family Medicine

## 2023-08-22 VITALS — BP 90/60 | HR 78 | Temp 96.9°F | Resp 16 | Ht 63.0 in | Wt 239.0 lb

## 2023-08-22 DIAGNOSIS — J208 Acute bronchitis due to other specified organisms: Secondary | ICD-10-CM | POA: Diagnosis not present

## 2023-08-22 DIAGNOSIS — J018 Other acute sinusitis: Secondary | ICD-10-CM | POA: Diagnosis not present

## 2023-08-22 DIAGNOSIS — K219 Gastro-esophageal reflux disease without esophagitis: Secondary | ICD-10-CM | POA: Insufficient documentation

## 2023-08-22 MED ORDER — AZITHROMYCIN 250 MG PO TABS: ORAL_TABLET | ORAL | 0 refills | Status: AC

## 2023-08-22 NOTE — Progress Notes (Unsigned)
 Acute Office Visit  Subjective:    Patient ID: Anna [name], female    DOB: [birth year], 53 y.o.   MRN: [id number]  Chief Complaint  Patient presents with   Sinusitis    HPI: Patient is in today for productive cough, nasal congestion, chest congestion, and wheezing since 4 days ago. She mentioned sinus pressure and chills. She denies fever, ear pain.  Doing well on mounjaro . Has lost 25 lbs. Tolerating well.   Past Medical History:  Diagnosis Date   ADD (attention deficit disorder)    Arthritis    Asthma    seasonal   Bipolar affective disorder, current episode depressed (HCC)    Diabetes (HCC) 07/10/2023   GERD (gastroesophageal reflux disease)    occ   History of recurrent UTIs 03/11/2012   Hypertension    Moderate persistent asthma with exacerbation 12/16/2021    Past Surgical History:  Procedure Laterality Date   ABDOMINAL HYSTERECTOMY  2010   endometriosis. Total hysterectomy   ANTERIOR CERVICAL DECOMP/DISCECTOMY FUSION N/A 07/13/2012   Procedure: ANTERIOR CERVICAL DECOMPRESSION/DISCECTOMY FUSION 1 LEVEL;  Surgeon: Ferris Hua, MD;  Location: [facility];  Service: Neurosurgery;  Laterality: N/A;  Anterior Cervical Decompression/Discectomy Cervical Three-Four   CARPAL TUNNEL RELEASE Right    CERVICAL DISC SURGERY  09   CHOLECYSTECTOMY      Family History  Problem Relation Age of Onset   Breast cancer Neg Hx     Social History   Socioeconomic History   Marital status: Married    Spouse name: Not on file   Number of children: Not on file   Years of education: Not on file   Highest education level: Not on file  Occupational History   Not on file  Tobacco Use   Smoking status: Never   Smokeless tobacco: Never  Vaping Use   Vaping status: Never Used  Substance and Sexual Activity   Alcohol use: No   Drug use: No    Comment: 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   Sexual activity: Not on file  Other Topics Concern   Not on file  Social History Narrative    Not on file   Social Drivers of Health   Financial Resource Strain: Low Risk  (07/09/2023)   Overall Financial Resource Strain (CARDIA)    Difficulty of Paying Living Expenses: Not hard at all  Food Insecurity: No Food Insecurity (07/09/2023)   Hunger Vital Sign    Worried About Running Out of Food in the Last Year: Never true    Ran Out of Food in the Last Year: Never true  Transportation Needs: No Transportation Needs (07/09/2023)   PRAPARE - Administrator, Civil Service (Medical): No    Lack of Transportation (Non-Medical): No  Physical Activity: Inactive (07/09/2023)   Exercise Vital Sign    Days of Exercise per Week: 0 days    Minutes of Exercise per Session: 0 min  Stress: No Stress Concern Present (07/09/2023)   [employer] of Occupational Health - Occupational Stress Questionnaire    Feeling of Stress : Not at all  Social Connections: Moderately Integrated (07/09/2023)   Social Connection and Isolation Panel    Frequency of Communication with Friends and Family: More than three times a week    Frequency of Social Gatherings with Friends and Family: Three times a week    Attends Religious Services: More than 4 times per year    Active Member of Clubs or Organizations: No  Attends Banker Meetings: Never    Marital Status: Married  Catering manager Violence: Not At Risk (07/09/2023)   Humiliation, Afraid, Rape, and Kick questionnaire    Fear of Current or Ex-Partner: No    Emotionally Abused: No    Physically Abused: No    Sexually Abused: No    Outpatient Medications Prior to Visit  Medication Sig Dispense Refill   albuterol  (VENTOLIN  HFA) 108 (90 Base) MCG/ACT inhaler Inhale 2 puffs into the lungs every 6 (six) hours as needed for wheezing or shortness of breath. 18 g 1   Albuterol -Budesonide (AIRSUPRA ) 90-80 MCG/ACT AERO Inhale 90 mcg into the lungs every 6 (six) hours as needed for up to 28 doses. 5.9 g 1   ALPRAZolam  (XANAX ) 0.5 MG  tablet TAKE 1 TABLET BY MOUTH DAILY AS NEEDED FOR SEVERE ANXIETY 30 tablet 2   amLODipine -olmesartan  (AZOR ) 10-40 MG tablet TAKE 1 TABLET BY MOUTH EVERY DAY 90 tablet 1   amphetamine -dextroamphetamine  (ADDERALL XR) 30 MG 24 hr capsule Take 1 capsule (30 mg total) by mouth every morning. 30 capsule 0   cariprazine  (VRAYLAR ) 1.5 MG capsule Take 1 capsule (1.5 mg total) by mouth daily. 30 capsule 2   cyclobenzaprine  (FLEXERIL ) 5 MG tablet TAKE 1 TABLET BY MOUTH THREE TIMES A DAY AS NEEDED FOR MUSCLE SPASM 60 tablet 1   fluticasone  (FLONASE ) 50 MCG/ACT nasal spray Place 2 sprays into both nostrils daily. 18 mL 2   metFORMIN  (GLUCOPHAGE ) 500 MG tablet Take 1 tablet (500 mg total) by mouth daily with breakfast. 90 tablet 1   nystatin -triamcinolone  ointment (MYCOLOG) Apply 1 Application topically 2 (two) times daily. 30 g 0   omega-3 acid ethyl esters (LOVAZA ) 1 g capsule TAKE 1 CAPSULE BY MOUTH TWICE A DAY 180 capsule 0   ondansetron  (ZOFRAN ) 4 MG tablet Take 1 tablet (4 mg total) by mouth every 8 (eight) hours as needed for nausea or vomiting. 20 tablet 0   PARoxetine  (PAXIL -CR) 37.5 MG 24 hr tablet TAKE 1 TABLET BY MOUTH EVERY DAY 90 tablet 1   rosuvastatin  (CRESTOR ) 20 MG tablet TAKE 1 TABLET BY MOUTH EVERY DAY 90 tablet 1   tirzepatide  (MOUNJARO ) 5 MG/0.5ML Pen Inject 5 mg into the skin once a week. 2 mL 2   traZODone  (DESYREL ) 50 MG tablet TAKE 1 TABLET BY MOUTH EVERYDAY AT BEDTIME 90 tablet 3   triamcinolone  cream (KENALOG ) 0.1 % Apply 1 application. topically 2 (two) times daily. 45 g 1   valACYclovir  (VALTREX ) 500 MG tablet Take 1 tablet (500 mg total) by mouth 2 (two) times daily. 14 tablet 1   Vitamin D , Ergocalciferol , (DRISDOL ) 1.25 MG (50000 UNIT) CAPS capsule TAKE 1 CAPSULE (50,000 UNITS TOTAL) BY MOUTH TWO TIMES A WEEK 24 capsule 0   No facility-administered medications prior to visit.    Allergies  Allergen Reactions   Avelox [Moxifloxacin Hcl In Nacl] Anaphylaxis   Lactose  Intolerance (Gi) Diarrhea   Tape Other (See Comments)    If tape on a while will pull off skin    Review of Systems  Constitutional:  Positive for chills. Negative for fatigue and fever.  HENT:  Positive for congestion and sinus pressure. Negative for ear pain and sore throat.   Respiratory:  Positive for cough. Negative for shortness of breath.   Cardiovascular:  Negative for chest pain and palpitations.  Gastrointestinal:  Negative for abdominal pain, constipation, diarrhea, nausea and vomiting.  Endocrine: Negative for polydipsia, polyphagia and polyuria.  Genitourinary:  Negative for difficulty urinating and dysuria.  Musculoskeletal:  Negative for arthralgias, back pain and myalgias.  Skin:  Negative for rash.  Neurological:  Negative for headaches.  Psychiatric/Behavioral:  Negative for dysphoric mood. The patient is not nervous/anxious.        Objective:        08/22/2023    9:47 AM 07/09/2023    7:44 AM 05/26/2023    3:44 PM  Vitals with BMI  Height 5' 3 5' 3 5' 3  Weight 239 lbs 264 lbs 260 lbs 10 oz  BMI 42.35 46.78 46.17  Systolic 90 132 110  Diastolic 60 72 78  Pulse 78 91 81    No data found.   Physical Exam Vitals reviewed.  Constitutional:      Appearance: Normal appearance.  HENT:     Right Ear: Tympanic membrane, ear canal and external ear normal.     Left Ear: Tympanic membrane, ear canal and external ear normal.     Nose: Nose normal.     Comments: Sinus tenderness     Mouth/Throat:     Pharynx: No oropharyngeal exudate or posterior oropharyngeal erythema.     Comments: Hoarse.  Cardiovascular:     Rate and Rhythm: Normal rate and regular rhythm.     Heart sounds: Normal heart sounds. No murmur heard. Pulmonary:     Effort: Pulmonary effort is normal. No respiratory distress.     Breath sounds: Normal breath sounds.  Lymphadenopathy:     Cervical: No cervical adenopathy.   Neurological:     Mental Status: She is alert and oriented to  person, place, and time.   Psychiatric:        Mood and Affect: Mood normal.        Behavior: Behavior normal.     Health Maintenance Due  Topic Date Due   Zoster Vaccines- Shingrix (1 of 2) Never done   Fecal DNA (Cologuard)  Never done    There are no preventive care reminders to display for this patient.   Lab Results  Component Value Date   TSH 1.000 03/29/2021   Lab Results  Component Value Date   WBC 9.5 07/09/2023   HGB 11.7 07/09/2023   HCT 35.4 07/09/2023   MCV 91 07/09/2023   PLT 262 07/09/2023   Lab Results  Component Value Date   NA 139 07/09/2023   K 4.2 07/09/2023   CO2 26 07/09/2023   GLUCOSE 120 (H) 07/09/2023   BUN 10 07/09/2023   CREATININE 1.01 (H) 07/09/2023   BILITOT 0.5 07/09/2023   ALKPHOS 93 07/09/2023   AST 20 07/09/2023   ALT 20 07/09/2023   PROT 6.2 07/09/2023   ALBUMIN 4.1 07/09/2023   CALCIUM  9.1 07/09/2023   EGFR 66 07/09/2023   Lab Results  Component Value Date   CHOL 190 07/09/2023   Lab Results  Component Value Date   HDL 62 07/09/2023   Lab Results  Component Value Date   LDLCALC 112 (H) 07/09/2023   Lab Results  Component Value Date   TRIG 87 07/09/2023   Lab Results  Component Value Date   CHOLHDL 3.1 07/09/2023   Lab Results  Component Value Date   HGBA1C 6.9 (H) 07/09/2023       Assessment & Plan:  Acute bronchitis due to other specified organisms -     Azithromycin ; Take 2 tablets on day 1, then 1 tablet daily on days 2 through 5  Dispense: 6 tablet; Refill: 0 -  POCT Influenza A/B -     POC COVID-19 BinaxNow  Acute non-recurrent sinusitis of other sinus -     Azithromycin ; Take 2 tablets on day 1, then 1 tablet daily on days 2 through 5  Dispense: 6 tablet; Refill: 0 -     POCT Influenza A/B -     POC COVID-19 BinaxNow     Meds ordered this encounter  Medications   azithromycin  (ZITHROMAX ) 250 MG tablet    Sig: Take 2 tablets on day 1, then 1 tablet daily on days 2 through 5     Dispense:  6 tablet    Refill:  0    Orders Placed This Encounter  Procedures   POCT Influenza A/B   POC COVID-19 BinaxNow     Follow-up: Return if symptoms worsen or fail to improve.  An After Visit Summary was printed and given to the patient.  Mercy Stall, MD [facility] [phone]

## 2023-08-25 ENCOUNTER — Encounter: Payer: Self-pay | Admitting: Family Medicine

## 2023-08-25 DIAGNOSIS — J019 Acute sinusitis, unspecified: Secondary | ICD-10-CM | POA: Insufficient documentation

## 2023-08-25 LAB — POCT INFLUENZA A/B
Influenza A, POC: NEGATIVE
Influenza B, POC: NEGATIVE

## 2023-08-25 LAB — POC COVID19 BINAXNOW: SARS Coronavirus 2 Ag: NEGATIVE

## 2023-08-26 ENCOUNTER — Other Ambulatory Visit: Payer: Self-pay

## 2023-08-26 ENCOUNTER — Ambulatory Visit: Payer: Self-pay

## 2023-08-26 MED ORDER — PREDNISONE 50 MG PO TABS: 50.0000 mg | ORAL_TABLET | Freq: Every day | ORAL | 0 refills | Status: DC

## 2023-08-26 NOTE — Telephone Encounter (Addendum)
 First attempt; no answer. Second attempt; no answer. Third attempt; no answer; forwarded to office.   Copied from CRM [id number]. Topic: Clinical - Pink Word Triage >> Aug 26, 2023  9:28 AM Marissa P wrote: Red Word that prompted transfer to Nurse Triage: Patient has some wheezing and still a very bad cough and very sore from coughing. Patient needs something called in please, did not state any pain or any other symptoms.

## 2023-08-26 NOTE — Telephone Encounter (Signed)
 Patient informed sending Rx to [facility] in [place] per patient.

## 2023-08-29 ENCOUNTER — Other Ambulatory Visit: Payer: Self-pay

## 2023-08-29 ENCOUNTER — Telehealth: Payer: Self-pay

## 2023-08-29 NOTE — Telephone Encounter (Signed)
 Rx request received. Approved-Signed and faxed back with 5 refills.

## 2023-08-29 NOTE — Telephone Encounter (Signed)
 Spoke to [facility] requested they refax the rx to our office for Dr. Reinhold Carbine to review and either approve or deny.  Copied from CRM [id number]. Topic: Clinical - Medication Question >> Aug 28, 2023  2:44 PM Marissa P wrote: Reason for CRM: Patient called in wanting to know why hormone meds weren't filled today and completely out unsure of name for me to submit refill for her. Please follow up with patient

## 2023-09-01 ENCOUNTER — Other Ambulatory Visit: Payer: Self-pay | Admitting: Family Medicine

## 2023-09-01 ENCOUNTER — Telehealth: Payer: Self-pay

## 2023-09-01 NOTE — Telephone Encounter (Signed)
 Copied from CRM [id number]. Topic: Clinical - Medication Question >> Sep 01, 2023 10:53 AM Everette C wrote: Reason for CRM: The patient shares they'll be traveling out of town on Wednesday 09/03/23 and wanted to discuss an early multi-week refill of their prescription for amphetamine -dextroamphetamine  (ADDERALL XR) 30 MG 24 hr capsule [[id number]] with a member of practice staff   Please contact further if/when possible

## 2023-09-02 ENCOUNTER — Other Ambulatory Visit: Payer: Self-pay | Admitting: Family Medicine

## 2023-09-02 DIAGNOSIS — F3131 Bipolar disorder, current episode depressed, mild: Secondary | ICD-10-CM

## 2023-09-03 ENCOUNTER — Other Ambulatory Visit: Payer: Self-pay | Admitting: Family Medicine

## 2023-09-03 MED ORDER — AMPHETAMINE-DEXTROAMPHET ER 30 MG PO CP24: 30.0000 mg | ORAL_CAPSULE | ORAL | 0 refills | Status: DC

## 2023-09-09 ENCOUNTER — Other Ambulatory Visit: Payer: Self-pay | Admitting: Family Medicine

## 2023-09-09 DIAGNOSIS — Z1231 Encounter for screening mammogram for malignant neoplasm of breast: Secondary | ICD-10-CM

## 2023-09-19 ENCOUNTER — Encounter

## 2023-09-19 DIAGNOSIS — Z1231 Encounter for screening mammogram for malignant neoplasm of breast: Secondary | ICD-10-CM

## 2023-10-06 ENCOUNTER — Other Ambulatory Visit: Payer: Self-pay | Admitting: Family Medicine

## 2023-10-06 MED ORDER — AMPHETAMINE-DEXTROAMPHET ER 30 MG PO CP24: 30.0000 mg | ORAL_CAPSULE | ORAL | 0 refills | Status: DC

## 2023-10-06 NOTE — Telephone Encounter (Signed)
 Copied from CRM [id number]. Topic: Clinical - Medication Refill >> Oct 06, 2023  4:08 PM Wess RAMAN wrote: Medication: amphetamine -dextroamphetamine  (ADDERALL XR) 30 MG 24 hr capsule   Has the patient contacted their pharmacy? Yes (Agent: If no, request that the patient contact the pharmacy for the refill. If patient does not wish to contact the pharmacy document the reason why and proceed with request.) (Agent: If yes, when and what did the pharmacy advise?)  This is the patient's preferred pharmacy:  [id number] [place], [place] - [address] [facility] [place] [address] Phone: [phone] Fax: [phone]  Is this the correct pharmacy for this prescription? Yes If no, delete pharmacy and type the correct one.   Has the prescription been filled recently? Yes  Is the patient out of the medication? No  Has the patient been seen for an appointment in the last year OR does the patient have an upcoming appointment? Yes  Can we respond through MyChart? Yes  Agent: Please be advised that Rx refills may take up to 3 business days. We ask that you follow-up with your pharmacy.

## 2023-10-07 ENCOUNTER — Ambulatory Visit: Payer: Self-pay

## 2023-10-07 ENCOUNTER — Encounter: Payer: Self-pay | Admitting: Family Medicine

## 2023-10-07 ENCOUNTER — Other Ambulatory Visit: Payer: Self-pay

## 2023-10-07 DIAGNOSIS — F902 Attention-deficit hyperactivity disorder, combined type: Secondary | ICD-10-CM

## 2023-10-07 NOTE — Telephone Encounter (Signed)
 Copied from CRM [id number]. Topic: Clinical - Prescription Issue >> Oct 07, 2023  2:48 PM Jasmin G wrote: Reason for CRM: Pt needs a call back from nurse due to preferred pharmacy requiring diagnostic codes for refill on her amphetamine -dextroamphetamine  (ADDERALL XR) 30 MG 24 hr capsule. Please call pt back ASAP

## 2023-10-08 ENCOUNTER — Telehealth: Payer: Self-pay | Admitting: Family Medicine

## 2023-10-08 ENCOUNTER — Telehealth: Payer: Self-pay

## 2023-10-08 ENCOUNTER — Other Ambulatory Visit: Payer: Self-pay

## 2023-10-08 DIAGNOSIS — F902 Attention-deficit hyperactivity disorder, combined type: Secondary | ICD-10-CM

## 2023-10-08 NOTE — Telephone Encounter (Signed)
 Request for medication has been sent to provider to send by to pharmacy withdiagnoses code   Copied from CRM [id number]. Topic: Clinical - Prescription Issue >> Oct 08, 2023  9:16 AM Carlatta H wrote: Reason for CRM: Patient called and stated that the pharmacy needed to be contacted and advised of the diagnoses code for amphetamine -dextroamphetamine  (ADDERALL XR) 30 MG 24 hr capsule [[id number]] medication//Please contact the pharmacy, the patient is also out of medication

## 2023-10-08 NOTE — Telephone Encounter (Unsigned)
 Copied from CRM [id number]. Topic: Clinical - Prescription Issue >> Oct 08, 2023  5:20 PM Tiffini S wrote: Reason for CRM: Patient called stating that there is no diagnosis code per the pharmacy- request was sent twice by fax to the clinic for amphetamine -dextroamphetamine  (ADDERALL XR) 30 MG 24 hr capsule- patient is out of the medication and need asap.   Please call the patient at [phone]. Patient called Tuesday 7/29 and today 7/30- have not received a call back.

## 2023-10-09 ENCOUNTER — Other Ambulatory Visit: Payer: Self-pay

## 2023-10-09 ENCOUNTER — Encounter: Payer: Self-pay | Admitting: Family Medicine

## 2023-10-09 ENCOUNTER — Ambulatory Visit: Admitting: Family Medicine

## 2023-10-09 VITALS — BP 110/60 | HR 78 | Temp 98.1°F | Resp 16 | Ht 63.0 in | Wt 231.0 lb

## 2023-10-09 DIAGNOSIS — F902 Attention-deficit hyperactivity disorder, combined type: Secondary | ICD-10-CM

## 2023-10-09 DIAGNOSIS — E782 Mixed hyperlipidemia: Secondary | ICD-10-CM | POA: Diagnosis not present

## 2023-10-09 DIAGNOSIS — I10 Essential (primary) hypertension: Secondary | ICD-10-CM | POA: Diagnosis not present

## 2023-10-09 DIAGNOSIS — E1169 Type 2 diabetes mellitus with other specified complication: Secondary | ICD-10-CM | POA: Insufficient documentation

## 2023-10-09 DIAGNOSIS — Z1211 Encounter for screening for malignant neoplasm of colon: Secondary | ICD-10-CM

## 2023-10-09 DIAGNOSIS — Z23 Encounter for immunization: Secondary | ICD-10-CM | POA: Diagnosis not present

## 2023-10-09 MED ORDER — AMPHETAMINE-DEXTROAMPHET ER 30 MG PO CP24: 30.0000 mg | ORAL_CAPSULE | ORAL | 0 refills | Status: DC

## 2023-10-09 MED ORDER — AMPHETAMINE-DEXTROAMPHET ER 30 MG PO CP24
30.0000 mg | ORAL_CAPSULE | ORAL | 0 refills | Status: DC
Start: 2023-10-09 — End: 2023-11-05

## 2023-10-09 MED ORDER — TIRZEPATIDE 7.5 MG/0.5ML ~~LOC~~ SOAJ
7.5000 mg | SUBCUTANEOUS | 2 refills | Status: DC
Start: 2023-10-09 — End: 2024-01-14

## 2023-10-09 MED ORDER — VITAMIN D (ERGOCALCIFEROL) 1.25 MG (50000 UNIT) PO CAPS: 50000.0000 [IU] | ORAL_CAPSULE | ORAL | 0 refills | Status: DC

## 2023-10-09 NOTE — Assessment & Plan Note (Signed)
 Well controlled.  No medication changes recommended. Continue azor to 10/40 mg daily.   Continue healthy diet and exercise.   Labs drawn today

## 2023-10-09 NOTE — Assessment & Plan Note (Addendum)
 Control: Controlled Recommend check sugars fasting daily. Recommend check feet daily. Recommend annual eye exams. Medicines: Mounjaro  7.5 mg daily, metformin  500 mg daily. Continue to work on eating a healthy diet and exercise.  Labs drawn today.

## 2023-10-09 NOTE — Assessment & Plan Note (Signed)
 Well controlled.  Continue Adderall XR 30 mg every morning.

## 2023-10-09 NOTE — Assessment & Plan Note (Signed)
 Well controlled.  No changes to medicines.  Continue rosuvastatin 20 mg daily.  Continue Lovaza 1 g 1 capsule twice daily.  Continue to work on eating a healthy diet and exercise.  Labs drawn today.

## 2023-10-09 NOTE — Progress Notes (Signed)
 Subjective:  Patient ID: Anna [name], female    DOB: [birth year]  Age: 53 y.o. MRN: [id number]  Chief Complaint  Patient presents with   Medical Management of Chronic Issues   HPI:  Discussed the use of AI scribe software for clinical note transcription with the patient, who gave verbal consent to proceed.  History of Present Illness   Anna [name] is a 53 year old female with diabetes and hypertension who presents for a follow-up visit.  Glycemic control and weight management - Diabetes mellitus with recent active management and significant weight loss from 264 pounds (April 30th) to 231 pounds (current) - Weight loss attributed to dietary modifications, including smaller portions and healthier food choices such as spinach salads and sandwiches - Avoids foods that adversely affect blood glucose, such as daily bread - Currently taking Mounjaro  5 mg daily for appetite control - A1c previously elevated to 6.9 - No current use of metformin ; discontinued approximately one month ago due to diarrhea, which has resolved - Does not regularly check blood glucose but monitors symptoms to differentiate between anxiety and hypoglycemia - No numbness in feet; regularly checks for neuropathic symptoms due to family history of diabetic neuropathy - No abdominal pain, chest pain, or joint pain  Hypertension and hyperlipidemia management - Hypertension under ongoing management: azor  10-40 mg daily.  - Takes rosuvastatin  for cholesterol control  Neuropsychiatric symptoms and medication effects - History of bipolar disorder managed with Paxil  CR 37.5 mg and Vraylar , both taken in the morning for mood stabilization - Occasional use of trazodone  for sleep - Uses Xanax  0.5 mg as needed, primarily during the school year; since weight loss, a full dose causes increased somnolence - Experiences anxiety, particularly during physical activity such as walking at [facility], and adjusts food intake  accordingly  Lifestyle and behavioral modifications - Engages in regular physical activity, including walking and weight training - Incorporates exercises to address boredom and stress eating - Uses gum to avoid unnecessary snacking - Successful in maintaining weight loss efforts  Constitutional and general symptoms - No fever, chills, earaches, sore throat, stuffy nose, or numbness in feet         10/09/2023    7:45 AM 07/09/2023    7:50 AM 05/26/2023    3:45 PM 04/04/2023    7:37 AM 03/18/2023    1:23 PM  Depression screen PHQ 2/9  Decreased Interest 0 2 0 0 0  Down, Depressed, Hopeless 0 1 0 0 0  PHQ - 2 Score 0 3 0 0 0  Altered sleeping 0 3   0  Tired, decreased energy 0 0   0  Change in appetite 0 0   0  Feeling bad or failure about yourself  0 0   0  Trouble concentrating 0 0   0  Moving slowly or fidgety/restless 0 0   0  Suicidal thoughts 0 0   0  PHQ-9 Score 0 6   0  Difficult doing work/chores Not difficult at all Not difficult at all   Not difficult at all        10/09/2023    7:45 AM  Fall Risk   Falls in the past year? 0  Number falls in past yr: 0  Injury with Fall? 0  Risk for fall due to : No Fall Risks  Follow up Falls evaluation completed    Patient Care Team: [name] Clapper, MD as PCP - General (Family Medicine)   Review  of Systems  Constitutional:  Negative for chills, fatigue and fever.  HENT:  Negative for congestion, ear pain and sore throat.   Respiratory:  Negative for cough and shortness of breath.   Cardiovascular:  Negative for chest pain.  Gastrointestinal:  Negative for abdominal pain, constipation, diarrhea, nausea and vomiting.  Genitourinary:  Negative for dysuria and urgency.  Musculoskeletal:  Negative for arthralgias and myalgias.  Skin:  Negative for rash.  Neurological:  Negative for dizziness and headaches.  Psychiatric/Behavioral:  Negative for dysphoric mood. The patient is not nervous/anxious.     Current Outpatient  Medications on File Prior to Visit  Medication Sig Dispense Refill   albuterol  (VENTOLIN  HFA) 108 (90 Base) MCG/ACT inhaler Inhale 2 puffs into the lungs every 6 (six) hours as needed for wheezing or shortness of breath. 18 g 1   ALPRAZolam  (XANAX ) 0.5 MG tablet TAKE 1 TABLET BY MOUTH DAILY AS NEEDED FOR SEVERE ANXIETY 30 tablet 2   amLODipine -olmesartan  (AZOR ) 10-40 MG tablet TAKE 1 TABLET BY MOUTH EVERY DAY 90 tablet 1   fluticasone  (FLONASE ) 50 MCG/ACT nasal spray Place 2 sprays into both nostrils daily. 18 mL 2   omega-3 acid ethyl esters (LOVAZA ) 1 g capsule TAKE 1 CAPSULE BY MOUTH TWICE A DAY 180 capsule 0   ondansetron  (ZOFRAN ) 4 MG tablet Take 1 tablet (4 mg total) by mouth every 8 (eight) hours as needed for nausea or vomiting. 20 tablet 0   PARoxetine  (PAXIL -CR) 37.5 MG 24 hr tablet TAKE 1 TABLET BY MOUTH EVERY DAY 90 tablet 1   PRESCRIPTION MEDICATION [facility] Biest 5/5 (estriol/estradiol)/Progesterone/Testosterone Dissolve 1/2 troche between upper gumline and cheek BID-do not chew, swallow, or place under tongue.     rosuvastatin  (CRESTOR ) 20 MG tablet TAKE 1 TABLET BY MOUTH EVERY DAY 90 tablet 1   traZODone  (DESYREL ) 50 MG tablet TAKE 1 TABLET BY MOUTH EVERYDAY AT BEDTIME 90 tablet 3   triamcinolone  cream (KENALOG ) 0.1 % Apply 1 application. topically 2 (two) times daily. (Patient taking differently: Apply 1 application  topically 2 (two) times daily. As needed) 45 g 1   valACYclovir  (VALTREX ) 500 MG tablet Take 1 tablet (500 mg total) by mouth 2 (two) times daily. 14 tablet 1   VRAYLAR  1.5 MG capsule TAKE 1 CAPSULE BY MOUTH DAILY. 30 capsule 2   cyclobenzaprine  (FLEXERIL ) 5 MG tablet TAKE 1 TABLET BY MOUTH THREE TIMES A DAY AS NEEDED FOR MUSCLE SPASM (Patient not taking: Reported on 10/09/2023) 60 tablet 1   No current facility-administered medications on file prior to visit.   Past Medical History:  Diagnosis Date   ADD (attention deficit disorder)    Arthritis     Asthma    seasonal   Bipolar affective disorder, current episode depressed (HCC)    Diabetes (HCC) 07/10/2023   GERD (gastroesophageal reflux disease)    occ   History of recurrent UTIs 03/11/2012   Hypertension    Moderate persistent asthma with exacerbation 12/16/2021   Past Surgical History:  Procedure Laterality Date   ABDOMINAL HYSTERECTOMY  2010   endometriosis. Total hysterectomy   ANTERIOR CERVICAL DECOMP/DISCECTOMY FUSION N/A 07/13/2012   Procedure: ANTERIOR CERVICAL DECOMPRESSION/DISCECTOMY FUSION 1 LEVEL;  Surgeon: Arley SHAUNNA Helling, MD;  Location: [facility];  Service: Neurosurgery;  Laterality: N/A;  Anterior Cervical Decompression/Discectomy Cervical Three-Four   CARPAL TUNNEL RELEASE Right    CERVICAL DISC SURGERY  09   CHOLECYSTECTOMY      Family History  Problem Relation Age  of Onset   Breast cancer Neg Hx    Social History   Socioeconomic History   Marital status: Married    Spouse name: Not on file   Number of children: Not on file   Years of education: Not on file   Highest education level: Not on file  Occupational History   Not on file  Tobacco Use   Smoking status: Never   Smokeless tobacco: Never  Vaping Use   Vaping status: Never Used  Substance and Sexual Activity   Alcohol use: No   Drug use: No    Comment: 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   Sexual activity: Not on file  Other Topics Concern   Not on file  Social History Narrative   Not on file   Social Drivers of Health   Financial Resource Strain: Low Risk  (07/09/2023)   Overall Financial Resource Strain (CARDIA)    Difficulty of Paying Living Expenses: Not hard at all  Food Insecurity: No Food Insecurity (07/09/2023)   Hunger Vital Sign    Worried About Running Out of Food in the Last Year: Never true    Ran Out of Food in the Last Year: Never true  Transportation Needs: No Transportation Needs (07/09/2023)   PRAPARE - Administrator, Civil Service (Medical): No    Lack  of Transportation (Non-Medical): No  Physical Activity: Inactive (07/09/2023)   Exercise Vital Sign    Days of Exercise per Week: 0 days    Minutes of Exercise per Session: 0 min  Stress: No Stress Concern Present (07/09/2023)   [employer] of Occupational Health - Occupational Stress Questionnaire    Feeling of Stress : Not at all  Social Connections: Moderately Integrated (07/09/2023)   Social Connection and Isolation Panel    Frequency of Communication with Friends and Family: More than three times a week    Frequency of Social Gatherings with Friends and Family: Three times a week    Attends Religious Services: More than 4 times per year    Active Member of Clubs or Organizations: No    Attends Banker Meetings: Never    Marital Status: Married    Objective:  BP 110/60   Pulse 78   Temp 98.1 F (36.7 C) (Temporal)   Resp 16   Ht 5' 3 (1.6 m)   Wt 231 lb (104.8 kg)   SpO2 99%   BMI 40.92 kg/m      10/09/2023    7:36 AM 08/22/2023    9:47 AM 07/09/2023    7:44 AM  BP/Weight  Systolic BP 110 90 132  Diastolic BP 60 60 72  Wt. (Lbs) 231 239 264  BMI 40.92 kg/m2 42.34 kg/m2 46.77 kg/m2    Physical Exam Vitals reviewed.  Constitutional:      Appearance: Normal appearance. She is obese.  Neck:     Vascular: No carotid bruit.  Cardiovascular:     Rate and Rhythm: Normal rate and regular rhythm.     Pulses: Normal pulses.     Heart sounds: Normal heart sounds.  Pulmonary:     Effort: Pulmonary effort is normal. No respiratory distress.     Breath sounds: Normal breath sounds.  Abdominal:     General: Abdomen is flat. Bowel sounds are normal.     Palpations: Abdomen is soft.     Tenderness: There is no abdominal tenderness.  Neurological:     Mental Status: She is alert and oriented  to person, place, and time.  Psychiatric:        Mood and Affect: Mood normal.        Behavior: Behavior normal.      Diabetic foot exam was performed with the  following findings:   No deformities, ulcerations, or other skin breakdown Normal sensation of 10g monofilament Intact posterior tibialis and dorsalis pedis pulses      Lab Results  Component Value Date   WBC 9.5 07/09/2023   HGB 11.7 07/09/2023   HCT 35.4 07/09/2023   PLT 262 07/09/2023   GLUCOSE 120 (H) 07/09/2023   CHOL 190 07/09/2023   TRIG 87 07/09/2023   HDL 62 07/09/2023   LDLCALC 112 (H) 07/09/2023   ALT 20 07/09/2023   AST 20 07/09/2023   NA 139 07/09/2023   K 4.2 07/09/2023   CL 100 07/09/2023   CREATININE 1.01 (H) 07/09/2023   BUN 10 07/09/2023   CO2 26 07/09/2023   TSH 1.000 03/29/2021   HGBA1C 6.9 (H) 07/09/2023      Assessment & Plan:  Combined hyperlipidemia associated with type 2 diabetes mellitus (HCC) Assessment & Plan: Control: Controlled Recommend check sugars fasting daily. Recommend check feet daily. Recommend annual eye exams. Medicines: Mounjaro  7.5 mg daily, metformin  500 mg daily. Continue to work on eating a healthy diet and exercise.  Labs drawn today.     Orders: -     Hemoglobin A1c -     Vitamin D  (Ergocalciferol ); Take 1 capsule (50,000 Units total) by mouth every 7 (seven) days.  Dispense: 24 capsule; Refill: 0 -     Microalbumin / creatinine urine ratio -     Tirzepatide ; Inject 7.5 mg into the skin once a week.  Dispense: 2 mL; Refill: 2  Attention deficit hyperactivity disorder (ADHD), combined type Assessment & Plan: Well controlled.  Continue Adderall XR 30 mg every morning.   Orders: -     Amphetamine -Dextroamphet ER; Take 1 capsule (30 mg total) by mouth every morning. For ADHD (F90.2)  Dispense: 30 capsule; Refill: 0  Mixed hyperlipidemia Assessment & Plan: Well controlled.  No changes to medicines.  Continue rosuvastatin  20 mg daily.  Continue Lovaza  1 g 1 capsule twice daily.  Continue to work on eating a healthy diet and exercise.  Labs drawn today.   Orders: -     Lipid panel  Essential hypertension,  benign Assessment & Plan: Well controlled.  No medication changes recommended. Continue azor  to 10/40 mg daily.   Continue healthy diet and exercise.   Labs drawn today  Orders: -     Comprehensive metabolic panel with GFR -     CBC with Differential/Platelet  Screen for colon cancer -     Cologuard  Encounter for immunization -     Varicella-zoster vaccine IM     Meds ordered this encounter  Medications   Vitamin D , Ergocalciferol , (DRISDOL ) 1.25 MG (50000 UNIT) CAPS capsule    Sig: Take 1 capsule (50,000 Units total) by mouth every 7 (seven) days.    Dispense:  24 capsule    Refill:  0   tirzepatide  (MOUNJARO ) 7.5 MG/0.5ML Pen    Sig: Inject 7.5 mg into the skin once a week.    Dispense:  2 mL    Refill:  2   DISCONTD: amphetamine -dextroamphetamine  (ADDERALL XR) 30 MG 24 hr capsule    Sig: Take 1 capsule (30 mg total) by mouth every morning.    Dispense:  30 capsule  Refill:  0   DISCONTD: amphetamine -dextroamphetamine  (ADDERALL XR) 30 MG 24 hr capsule    Sig: Take 1 capsule (30 mg total) by mouth every morning. For ADHD    Dispense:  30 capsule    Refill:  0   amphetamine -dextroamphetamine  (ADDERALL XR) 30 MG 24 hr capsule    Sig: Take 1 capsule (30 mg total) by mouth every morning. For ADHD (F90.2)    Dispense:  30 capsule    Refill:  0    Orders Placed This Encounter  Procedures   Varicella-zoster vaccine IM   Lipid panel   Hemoglobin A1c   Comprehensive metabolic panel with GFR   CBC with Differential/Platelet   Microalbumin / creatinine urine ratio   Cologuard     Follow-up: Return in about 3 months (around 01/09/2024) for chronic follow up.  I,Marla I Leal-Borjas,acting as a scribe for Abigail Free, MD.,have documented all relevant documentation on the behalf of Abigail Free, MD,as directed by  Abigail Free, MD while in the presence of Abigail Free, MD.    An After Visit Summary was printed and given to the patient.  I attest that I have reviewed  this visit and agree with the plan scribed by my staff.   Abigail Free, MD [facility] [phone]

## 2023-10-10 LAB — COMPREHENSIVE METABOLIC PANEL WITH GFR
ALT: 14 IU/L (ref 0–32)
AST: 23 IU/L (ref 0–40)
Albumin: 4.4 g/dL (ref 3.8–4.9)
Alkaline Phosphatase: 91 IU/L (ref 44–121)
BUN/Creatinine Ratio: 5 — ABNORMAL LOW (ref 9–23)
BUN: 5 mg/dL — ABNORMAL LOW (ref 6–24)
Bilirubin Total: 0.5 mg/dL (ref 0.0–1.2)
CO2: 20 mmol/L (ref 20–29)
Calcium: 9.3 mg/dL (ref 8.7–10.2)
Chloride: 100 mmol/L (ref 96–106)
Creatinine, Ser: 1.09 mg/dL — ABNORMAL HIGH (ref 0.57–1.00)
Globulin, Total: 2.2 g/dL (ref 1.5–4.5)
Glucose: 102 mg/dL — ABNORMAL HIGH (ref 70–99)
Potassium: 3.8 mmol/L (ref 3.5–5.2)
Sodium: 140 mmol/L (ref 134–144)
Total Protein: 6.6 g/dL (ref 6.0–8.5)
eGFR: 60 mL/min/1.73 (ref >59)

## 2023-10-10 LAB — LIPID PANEL
Chol/HDL Ratio: 2.5 ratio (ref 0.0–4.4)
Cholesterol, Total: 118 mg/dL (ref 100–199)
HDL: 47 mg/dL (ref >39)
LDL Chol Calc (NIH): 53 mg/dL (ref 0–99)
Triglycerides: 91 mg/dL (ref 0–149)
VLDL Cholesterol Cal: 18 mg/dL (ref 5–40)

## 2023-10-10 LAB — CBC WITH DIFFERENTIAL/PLATELET
Basophils Absolute: 0.1 x10E3/uL (ref 0.0–0.2)
Basos: 1 %
EOS (ABSOLUTE): 0.1 x10E3/uL (ref 0.0–0.4)
Eos: 1 %
Hematocrit: 40.9 % (ref 34.0–46.6)
Hemoglobin: 12.9 g/dL (ref 11.1–15.9)
Immature Grans (Abs): 0.1 x10E3/uL (ref 0.0–0.1)
Immature Granulocytes: 1 %
Lymphocytes Absolute: 3.7 x10E3/uL — ABNORMAL HIGH (ref 0.7–3.1)
Lymphs: 41 %
MCH: 29.7 pg (ref 26.6–33.0)
MCHC: 31.5 g/dL (ref 31.5–35.7)
MCV: 94 fL (ref 79–97)
Monocytes Absolute: 0.7 x10E3/uL (ref 0.1–0.9)
Monocytes: 7 %
Neutrophils Absolute: 4.4 x10E3/uL (ref 1.4–7.0)
Neutrophils: 49 %
Platelets: 308 x10E3/uL (ref 150–450)
RBC: 4.34 x10E6/uL (ref 3.77–5.28)
RDW: 12.5 % (ref 11.7–15.4)
WBC: 9 x10E3/uL (ref 3.4–10.8)

## 2023-10-10 LAB — HEMOGLOBIN A1C
Est. average glucose Bld gHb Est-mCnc: 117 mg/dL
Hgb A1c MFr Bld: 5.7 % — ABNORMAL HIGH (ref 4.8–5.6)

## 2023-10-10 LAB — MICROALBUMIN / CREATININE URINE RATIO
Creatinine, Urine: 148.1 mg/dL
Microalb/Creat Ratio: 5 mg/g{creat} (ref 0–29)
Microalbumin, Urine: 8 ug/mL

## 2023-10-12 ENCOUNTER — Ambulatory Visit: Payer: Self-pay | Admitting: Family Medicine

## 2023-10-24 ENCOUNTER — Other Ambulatory Visit: Payer: Self-pay | Admitting: Family Medicine

## 2023-10-24 DIAGNOSIS — F3131 Bipolar disorder, current episode depressed, mild: Secondary | ICD-10-CM

## 2023-10-31 NOTE — Telephone Encounter (Unsigned)
 Copied from CRM [id number]. Topic: Clinical - Medication Refill >> Oct 31, 2023  9:40 AM Dedra B wrote: Medication: amphetamine -dextroamphetamine  (ADDERALL XR) 30 MG 24 hr capsule  Has the patient contacted their pharmacy? Yes  (Agent: If yes, when and what did the pharmacy advise?) Pharmacy told her to contact office  This is the patient's preferred pharmacy:  [id number] - [place], [place] - [address] [facility] [place] [address] Phone: [phone] Fax: [phone]  Is this the correct pharmacy for this prescription? Yes  Has the prescription been filled recently? No  Is the patient out of the medication? No  Has the patient been seen for an appointment in the last year OR does the patient have an upcoming appointment? Yes  Can we respond through MyChart? Yes  Agent: Please be advised that Rx refills may take up to 3 business days. We ask that you follow-up with your pharmacy.

## 2023-11-04 ENCOUNTER — Other Ambulatory Visit: Payer: Self-pay | Admitting: Family Medicine

## 2023-11-04 NOTE — Telephone Encounter (Signed)
  10/09/23 30 tabs/0 RF      Copied from CRM #[id number]. Topic: Clinical - Medication Refill >> Oct 31, 2023  9:40 AM Dedra B wrote: Medication: amphetamine -dextroamphetamine  (ADDERALL XR) 30 MG 24 hr capsule  Has the patient contacted their pharmacy? Yes  (Agent: If yes, when and what did the pharmacy advise?) Pharmacy told her to contact office  This is the patient's preferred pharmacy:  [id number] - [place], [place] - [address] [facility] [place] [address] Phone: [phone] Fax: [phone]  Is this the correct pharmacy for this prescription? Yes  Has the prescription been filled recently? No  Is the patient out of the medication? No  Has the patient been seen for an appointment in the last year OR does the patient have an upcoming appointment? Yes  Can we respond through MyChart? Yes  Agent: Please be advised that Rx refills may take up to 3 business days. We ask that you follow-up with your pharmacy.

## 2023-11-05 ENCOUNTER — Other Ambulatory Visit: Payer: Self-pay | Admitting: Family Medicine

## 2023-11-05 ENCOUNTER — Telehealth: Payer: Self-pay

## 2023-11-05 DIAGNOSIS — F902 Attention-deficit hyperactivity disorder, combined type: Secondary | ICD-10-CM

## 2023-11-05 MED ORDER — AMPHETAMINE-DEXTROAMPHET ER 30 MG PO CP24: 30.0000 mg | ORAL_CAPSULE | ORAL | 0 refills | Status: DC

## 2023-11-05 MED ORDER — AMPHETAMINE-DEXTROAMPHET ER 30 MG PO CP24
30.0000 mg | ORAL_CAPSULE | ORAL | 0 refills | Status: DC
Start: 2023-11-05 — End: 2023-12-01

## 2023-11-05 NOTE — Telephone Encounter (Signed)
 Patient called in requesting refill on Adderall. It appears that one is already pended to you? Patient is wondering does she needs an appointment?

## 2023-11-14 ENCOUNTER — Other Ambulatory Visit: Payer: Self-pay

## 2023-12-01 ENCOUNTER — Other Ambulatory Visit: Payer: Self-pay | Admitting: Family Medicine

## 2023-12-01 MED ORDER — AMPHETAMINE-DEXTROAMPHET ER 30 MG PO CP24: 30.0000 mg | ORAL_CAPSULE | ORAL | 0 refills | Status: DC

## 2023-12-01 NOTE — Telephone Encounter (Signed)
 Copied from CRM [id number]. Topic: Clinical - Medication Refill >> Dec 01, 2023 12:52 PM Rosaria E wrote: Medication: amphetamine -dextroamphetamine  (ADDERALL XR) 30 MG 24 hr capsule  Has the patient contacted their pharmacy? Yes (Agent: If no, request that the patient contact the pharmacy for the refill. If patient does not wish to contact the pharmacy document the reason why and proceed with request.) (Agent: If yes, when and what did the pharmacy advise?)  This is the patient's preferred pharmacy:  [id number] - [place], [place] - [address] [place] [address] Phone: [phone] Fax: [phone]  Is this the correct pharmacy for this prescription? Yes If no, delete pharmacy and type the correct one.   Has the prescription been filled recently? Yes  Is the patient out of the medication? Almost  Has the patient been seen for an appointment in the last year OR does the patient have an upcoming appointment? Yes  Can we respond through MyChart? Yes  Agent: Please be advised that Rx refills may take up to 3 business days. We ask that you follow-up with your pharmacy.

## 2023-12-03 ENCOUNTER — Other Ambulatory Visit: Payer: Self-pay

## 2023-12-03 ENCOUNTER — Telehealth: Payer: Self-pay

## 2023-12-03 MED ORDER — AMPHETAMINE-DEXTROAMPHET ER 30 MG PO CP24: 30.0000 mg | ORAL_CAPSULE | ORAL | 0 refills | Status: DC

## 2023-12-03 NOTE — Telephone Encounter (Signed)
>>   Dec 03, 2023  8:07 AM Montie POUR wrote: Please check to make sure this was sent to pharmacy because chart shows this: amphetamine -dextroamphetamine  (ADDERALL XR) 30 MG 24 hr capsule 30 capsule 0 12/01/2023 --   Sig - Route: Take 1 capsule (30 mg total) by mouth every morning. For ADHD ICD-10:  F90.2 - Oral   Class: Print   Earliest Fill Date: 12/01/2023   Notes to Pharmacy: F90.2 ADD    I don't see where pharmacy received order to refill.     Copied from CRM [id number]. Topic: Clinical - Medication Refill >> Dec 01, 2023 12:52 PM Rosaria E wrote: Medication: amphetamine -dextroamphetamine  (ADDERALL XR) 30 MG 24 hr capsule  Has the patient contacted their pharmacy? Yes (Agent: If no, request that the patient contact the pharmacy for the refill. If patient does not wish to contact the pharmacy document the reason why and proceed with request.) (Agent: If yes, when and what did the pharmacy advise?)  This is the patient's preferred pharmacy:  [id number] - [place], [place] - [address] [facility] [place] [address] Phone: [phone] Fax: [phone]  Is this the correct pharmacy for this prescription? Yes If no, delete pharmacy and type the correct one.   Has the prescription been filled recently? Yes  Is the patient out of the medication? Almost  Has the patient been seen for an appointment in the last year OR does the patient have an upcoming appointment? Yes  Can we respond through MyChart? Yes  Agent: Please be advised that Rx refills may take up to 3 business days. We ask that you follow-up with your pharmacy.

## 2023-12-03 NOTE — Telephone Encounter (Signed)
>>   Dec 03, 2023 11:57 AM Anna [name] wrote: Patient called checking status.Anna [name] adv of turn time >> Dec 03, 2023  8:07 AM Anna [name] wrote: Please check to make sure this was sent to pharmacy because chart shows this: amphetamine -dextroamphetamine  (ADDERALL XR) 30 MG 24 hr capsule 30 capsule 0 12/01/2023 --   Sig - Route: Take 1 capsule (30 mg total) by mouth every morning. For ADHD ICD-10:  F90.2 - Oral   Class: Print   Earliest Fill Date: 12/01/2023   Notes to Pharmacy: F90.2 ADD    I don't see where pharmacy received order to refill. Copied from CRM [id number]. Topic: Clinical - Medication Refill >> Dec 01, 2023 12:52 PM Anna [name] wrote: Medication: amphetamine -dextroamphetamine  (ADDERALL XR) 30 MG 24 hr capsule  Has the patient contacted their pharmacy? Yes (Agent: If no, request that the patient contact the pharmacy for the refill. If patient does not wish to contact the pharmacy document the reason why and proceed with request.) (Agent: If yes, when and what did the pharmacy advise?)  This is the patient's preferred pharmacy:  [id number] - [place], [place] - [address] [facility] [place] [address] Phone: [phone] Fax: [phone]  Is this the correct pharmacy for this prescription? Yes If no, delete pharmacy and type the correct one.   Has the prescription been filled recently? Yes  Is the patient out of the medication? Almost  Has the patient been seen for an appointment in the last year OR does the patient have an upcoming appointment? Yes  Can we respond through MyChart? Yes  Agent: Please be advised that Rx refills may take up to 3 business days. We ask that you follow-up with your pharmacy.

## 2023-12-17 NOTE — Progress Notes (Signed)
 Acute Office Visit  Subjective:    Patient ID: Anna [name], female    DOB: September 20, 1970, 53 y.o.   MRN: [id number]  Chief Complaint  Patient presents with   Nasal Congestion    Seen at UC over the weeked was given Doxycycline  and prednisone  but is not feeling any better, was negative for covid and flu. Symptoms started last Tuesday. C/o feeling very fatigue.    Discussed the use of AI scribe software for clinical note transcription with the patient, who gave verbal consent to proceed.  History of Present Illness Anna [name] is a 53 year old female who presents with persistent fatigue following a viral illness.  Fatigue - Persistent fatigue ongoing for one week following a viral illness - Describes feeling 'wiped out' and 'drained' - Required a day off work to rest due to fatigue - Fatigue is compounded by increased work-related stress and workload changes - Continues to take Adderall; inconsistent use can affect energy levels  Upper respiratory symptoms - Initial fever lasting approximately 24 hours one week ago - Subsequent sinus involvement with intermittent nasal congestion - No chest pain - Some shortness of breath - Tested negative for COVID-19 and influenza at an early care facility  Antibiotic therapy - Currently taking doxycycline , four days into a ten-day course  Hydration and nutrition - Low fluid intake - Continues to eat, specifically mentioned consuming steak    Past Medical History:  Diagnosis Date   ADD (attention deficit disorder)    Arthritis    Asthma    seasonal   Bipolar affective disorder, current episode depressed (HCC)    Diabetes (HCC) 07/10/2023   GERD (gastroesophageal reflux disease)    occ   History of recurrent UTIs 03/11/2012   Hypertension    Moderate persistent asthma with exacerbation 12/16/2021    Past Surgical History:  Procedure Laterality Date   ABDOMINAL HYSTERECTOMY  2010   endometriosis. Total hysterectomy   ANTERIOR  CERVICAL DECOMP/DISCECTOMY FUSION N/A 07/13/2012   Procedure: ANTERIOR CERVICAL DECOMPRESSION/DISCECTOMY FUSION 1 LEVEL;  Surgeon: Arley SHAUNNA Helling, MD;  Location: [facility];  Service: Neurosurgery;  Laterality: N/A;  Anterior Cervical Decompression/Discectomy Cervical Three-Four   CARPAL TUNNEL RELEASE Right    CERVICAL DISC SURGERY  09   CHOLECYSTECTOMY      Family History  Problem Relation Age of Onset   Breast cancer Neg Hx     Social History   Socioeconomic History   Marital status: Married    Spouse name: Not on file   Number of children: Not on file   Years of education: Not on file   Highest education level: Not on file  Occupational History   Not on file  Tobacco Use   Smoking status: Never   Smokeless tobacco: Never  Vaping Use   Vaping status: Never Used  Substance and Sexual Activity   Alcohol use: No   Drug use: No    Comment: 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   Sexual activity: Not on file  Other Topics Concern   Not on file  Social History Narrative   Not on file   Social Drivers of Health   Financial Resource Strain: Low Risk  (07/09/2023)   Overall Financial Resource Strain (CARDIA)    Difficulty of Paying Living Expenses: Not hard at all  Food Insecurity: No Food Insecurity (07/09/2023)   Hunger Vital Sign    Worried About Running Out of Food in the Last Year: Never true  Ran Out of Food in the Last Year: Never true  Transportation Needs: No Transportation Needs (07/09/2023)   PRAPARE - Administrator, Civil Service (Medical): No    Lack of Transportation (Non-Medical): No  Physical Activity: Inactive (07/09/2023)   Exercise Vital Sign    Days of Exercise per Week: 0 days    Minutes of Exercise per Session: 0 min  Stress: No Stress Concern Present (07/09/2023)   [employer] of Occupational Health - Occupational Stress Questionnaire    Feeling of Stress : Not at all  Social Connections: Moderately Integrated (07/09/2023)   Social  Connection and Isolation Panel    Frequency of Communication with Friends and Family: More than three times a week    Frequency of Social Gatherings with Friends and Family: Three times a week    Attends Religious Services: More than 4 times per year    Active Member of Clubs or Organizations: No    Attends Banker Meetings: Never    Marital Status: Married  Catering manager Violence: Not At Risk (07/09/2023)   Humiliation, Afraid, Rape, and Kick questionnaire    Fear of Current or Ex-Partner: No    Emotionally Abused: No    Physically Abused: No    Sexually Abused: No    Outpatient Medications Prior to Visit  Medication Sig Dispense Refill   albuterol  (VENTOLIN  HFA) 108 (90 Base) MCG/ACT inhaler Inhale 2 puffs into the lungs every 6 (six) hours as needed for wheezing or shortness of breath. 18 g 1   ALPRAZolam  (XANAX ) 0.5 MG tablet TAKE 1 TABLET BY MOUTH DAILY AS NEEDED FOR SEVERE ANXIETY 30 tablet 2   amLODipine -olmesartan  (AZOR ) 10-40 MG tablet TAKE 1 TABLET BY MOUTH EVERY DAY 90 tablet 1   amphetamine -dextroamphetamine  (ADDERALL XR) 30 MG 24 hr capsule Take 1 capsule (30 mg total) by mouth every morning. For ADHD ICD-10:  F90.2 30 capsule 0   cyclobenzaprine  (FLEXERIL ) 5 MG tablet TAKE 1 TABLET BY MOUTH THREE TIMES A DAY AS NEEDED FOR MUSCLE SPASM 60 tablet 1   doxycycline  (VIBRAMYCIN ) 100 MG capsule Take 100 mg by mouth 2 (two) times daily.     fluticasone  (FLONASE ) 50 MCG/ACT nasal spray Place 2 sprays into both nostrils daily. 18 mL 2   omega-3 acid ethyl esters (LOVAZA ) 1 g capsule TAKE 1 CAPSULE BY MOUTH TWICE A DAY 180 capsule 0   ondansetron  (ZOFRAN ) 4 MG tablet Take 1 tablet (4 mg total) by mouth every 8 (eight) hours as needed for nausea or vomiting. 20 tablet 0   PARoxetine  (PAXIL -CR) 37.5 MG 24 hr tablet TAKE 1 TABLET BY MOUTH EVERY DAY 90 tablet 1   PRESCRIPTION MEDICATION [facility] Biest 5/5 (estriol/estradiol)/Progesterone/Testosterone Dissolve  1/2 troche between upper gumline and cheek BID-do not chew, swallow, or place under tongue.     rosuvastatin  (CRESTOR ) 20 MG tablet TAKE 1 TABLET BY MOUTH EVERY DAY 90 tablet 1   tirzepatide  (MOUNJARO ) 7.5 MG/0.5ML Pen Inject 7.5 mg into the skin once a week. 2 mL 2   traZODone  (DESYREL ) 50 MG tablet TAKE 1 TABLET BY MOUTH EVERYDAY AT BEDTIME 90 tablet 3   triamcinolone  cream (KENALOG ) 0.1 % Apply 1 application. topically 2 (two) times daily. (Patient taking differently: Apply 1 application  topically 2 (two) times daily. As needed) 45 g 1   valACYclovir  (VALTREX ) 500 MG tablet TAKE 1 TABLET BY MOUTH TWICE A DAY 14 tablet 1   Vitamin D , Ergocalciferol , (DRISDOL ) 1.25 MG (  50000 UNIT) CAPS capsule Take 1 capsule (50,000 Units total) by mouth every 7 (seven) days. 24 capsule 0   VRAYLAR  1.5 MG capsule TAKE 1 CAPSULE BY MOUTH DAILY. 30 capsule 2   amphetamine -dextroamphetamine  (ADDERALL XR) 30 MG 24 hr capsule Take 1 capsule (30 mg total) by mouth every morning. For ADHD (F90.2) 30 capsule 0   No facility-administered medications prior to visit.    Allergies  Allergen Reactions   Avelox [Moxifloxacin Hcl In Nacl] Anaphylaxis   Lactose Intolerance (Gi) Diarrhea   Tape Other (See Comments)    If tape on a while will pull off skin    Review of Systems  Constitutional:  Negative for chills, fatigue and fever.  HENT:  Negative for congestion, ear pain and sore throat.   Respiratory:  Negative for cough and shortness of breath.   Cardiovascular:  Negative for chest pain.       Objective:        12/18/2023    9:52 AM 10/09/2023    7:36 AM 08/22/2023    9:47 AM  Vitals with BMI  Height 5' 3 5' 3 5' 3  Weight 214 lbs 231 lbs 239 lbs  BMI 37.92 40.93 42.35  Systolic 118 110 90  Diastolic 70 60 60  Pulse 88 78 78    No data found.   Physical Exam Vitals reviewed.  Constitutional:      Appearance: Normal appearance. She is ill-appearing.  HENT:     Right Ear: Tympanic membrane,  ear canal and external ear normal.     Left Ear: Tympanic membrane, ear canal and external ear normal.     Nose: Congestion present.     Mouth/Throat:     Pharynx: Oropharynx is clear.  Cardiovascular:     Rate and Rhythm: Normal rate and regular rhythm.     Heart sounds: Normal heart sounds. No murmur heard. Pulmonary:     Effort: Pulmonary effort is normal. No respiratory distress.     Breath sounds: Normal breath sounds.  Lymphadenopathy:     Cervical: No cervical adenopathy.  Neurological:     Mental Status: She is alert and oriented to person, place, and time.  Psychiatric:        Mood and Affect: Mood normal.        Behavior: Behavior normal.     Health Maintenance Due  Topic Date Due   OPHTHALMOLOGY EXAM  Never done   Hepatitis B Vaccines 19-59 Average Risk (1 of 3 - 19+ 3-dose series) Never done   Fecal DNA (Cologuard)  Never done   Zoster Vaccines- Shingrix  (2 of 2) 12/04/2023       Topic Date Due   Hepatitis B Vaccines 19-59 Average Risk (1 of 3 - 19+ 3-dose series) Never done     Lab Results  Component Value Date   TSH 1.000 03/29/2021   Lab Results  Component Value Date   WBC 10.7 12/18/2023   HGB 13.6 12/18/2023   HCT 41.8 12/18/2023   MCV 92 12/18/2023   PLT 302 12/18/2023   Lab Results  Component Value Date   NA 136 12/18/2023   K 3.7 12/18/2023   CO2 24 12/18/2023   GLUCOSE 101 (H) 12/18/2023   BUN 11 12/18/2023   CREATININE 1.26 (H) 12/18/2023   BILITOT 0.5 12/18/2023   ALKPHOS 85 12/18/2023   AST 14 12/18/2023   ALT 11 12/18/2023   PROT 6.9 12/18/2023   ALBUMIN 4.2 12/18/2023   CALCIUM  9.5  12/18/2023   EGFR 50 (L) 12/18/2023   Lab Results  Component Value Date   CHOL 118 10/09/2023   Lab Results  Component Value Date   HDL 47 10/09/2023   Lab Results  Component Value Date   LDLCALC 53 10/09/2023   Lab Results  Component Value Date   TRIG 91 10/09/2023   Lab Results  Component Value Date   CHOLHDL 2.5 10/09/2023    Lab Results  Component Value Date   HGBA1C 5.7 (H) 10/09/2023        Results for orders placed or performed in visit on 12/18/23  CBC with Differential/Platelet   Collection Time: 12/18/23 10:30 AM  Result Value Ref Range   WBC 10.7 3.4 - 10.8 x10E3/uL   RBC 4.54 3.77 - 5.28 x10E6/uL   Hemoglobin 13.6 11.1 - 15.9 g/dL   Hematocrit 58.1 65.9 - 46.6 %   MCV 92 79 - 97 fL   MCH 30.0 26.6 - 33.0 pg   MCHC 32.5 31.5 - 35.7 g/dL   RDW 87.3 88.2 - 84.5 %   Platelets 302 150 - 450 x10E3/uL   Neutrophils 72 Not Estab. %   Lymphs 21 Not Estab. %   Monocytes 4 Not Estab. %   Eos 1 Not Estab. %   Basos 1 Not Estab. %   Neutrophils Absolute 7.8 (H) 1.4 - 7.0 x10E3/uL   Lymphocytes Absolute 2.2 0.7 - 3.1 x10E3/uL   Monocytes Absolute 0.5 0.1 - 0.9 x10E3/uL   EOS (ABSOLUTE) 0.1 0.0 - 0.4 x10E3/uL   Basophils Absolute 0.1 0.0 - 0.2 x10E3/uL   Immature Granulocytes 1 Not Estab. %   Immature Grans (Abs) 0.1 0.0 - 0.1 x10E3/uL  Comprehensive metabolic panel with GFR   Collection Time: 12/18/23 10:30 AM  Result Value Ref Range   Glucose 101 (H) 70 - 99 mg/dL   BUN 11 6 - 24 mg/dL   Creatinine, Ser 8.73 (H) 0.57 - 1.00 mg/dL   eGFR 50 (L) >40 fO/fpw/8.26   BUN/Creatinine Ratio 9 9 - 23   Sodium 136 134 - 144 mmol/L   Potassium 3.7 3.5 - 5.2 mmol/L   Chloride 96 96 - 106 mmol/L   CO2 24 20 - 29 mmol/L   Calcium  9.5 8.7 - 10.2 mg/dL   Total Protein 6.9 6.0 - 8.5 g/dL   Albumin 4.2 3.8 - 4.9 g/dL   Globulin, Total 2.7 1.5 - 4.5 g/dL   Bilirubin Total 0.5 0.0 - 1.2 mg/dL   Alkaline Phosphatase 85 49 - 135 IU/L   AST 14 0 - 40 IU/L   ALT 11 0 - 32 IU/L     Assessment & Plan:   Assessment & Plan Acute non-recurrent maxillary sinusitis Acute sinusitis likely viral. Some improvement with doxycycline . No COVID-19 or influenza. - Continue doxycycline  for 10 days. - Use Flonase  as needed. - Order blood work to rule out other causes. Orders:   CBC with Differential/Platelet    Comprehensive metabolic panel with GFR  Other fatigue Fatigue likely multifactorial, possibly stress-related and Adderall effects. - Encourage rest and fluid intake. - Ensure regular meals. - Continue current medications. - Order blood work to rule out other causes. Orders:   CBC with Differential/Platelet   Comprehensive metabolic panel with GFR    Body mass index is 37.91 kg/m.SABRA   No orders of the defined types were placed in this encounter.   Orders Placed This Encounter  Procedures   CBC with Differential/Platelet   Comprehensive metabolic panel  with GFR     I,Marla I Leal-Borjas,acting as a scribe for Abigail Free, MD.,have documented all relevant documentation on the behalf of Abigail Free, MD,as directed by  Abigail Free, MD while in the presence of Abigail Free, MD.   Follow-up: Return if symptoms worsen or fail to improve.  An After Visit Summary was printed and given to the patient.  I attest that I have reviewed this visit and agree with the plan scribed by my staff.   Abigail Free, MD [facility] [phone]

## 2023-12-18 ENCOUNTER — Ambulatory Visit: Admitting: Family Medicine

## 2023-12-18 ENCOUNTER — Encounter: Payer: Self-pay | Admitting: Family Medicine

## 2023-12-18 VITALS — BP 118/70 | HR 88 | Temp 98.0°F | Ht 63.0 in | Wt 214.0 lb

## 2023-12-18 DIAGNOSIS — R5383 Other fatigue: Secondary | ICD-10-CM | POA: Diagnosis not present

## 2023-12-18 DIAGNOSIS — J0100 Acute maxillary sinusitis, unspecified: Secondary | ICD-10-CM

## 2023-12-18 NOTE — Assessment & Plan Note (Addendum)
 Acute sinusitis likely viral. Some improvement with doxycycline . No COVID-19 or influenza. - Continue doxycycline  for 10 days. - Use Flonase  as needed. - Order blood work to rule out other causes. Orders:   CBC with Differential/Platelet   Comprehensive metabolic panel with GFR

## 2023-12-18 NOTE — Patient Instructions (Signed)
  VISIT SUMMARY: During your visit, we discussed your persistent fatigue following a recent viral illness and your ongoing upper respiratory symptoms. We also reviewed your current antibiotic therapy and hydration and nutrition habits.  YOUR PLAN: ACUTE SINUSITIS: You have acute sinusitis, likely caused by a virus. You have shown some improvement with your current antibiotic treatment. -Continue taking doxycycline  for the full 10-day course. -Use Flonase  as needed to help with nasal congestion. -We will order blood work to rule out other causes.  FATIGUE: Your fatigue is likely due to multiple factors, including stress and illness -Make sure to get plenty of rest and drink more fluids. -Eat regular meals to maintain your energy levels. -Continue taking your current medications as prescribed. -We will order blood work to rule out other causes of your fatigue.                      Contains text generated by [employer]bridge.                                 Contains text generated by [employer]bridge.

## 2023-12-19 ENCOUNTER — Ambulatory Visit: Payer: Self-pay | Admitting: Family Medicine

## 2023-12-19 LAB — COMPREHENSIVE METABOLIC PANEL WITH GFR
ALT: 11 IU/L (ref 0–32)
AST: 14 IU/L (ref 0–40)
Albumin: 4.2 g/dL (ref 3.8–4.9)
Alkaline Phosphatase: 85 IU/L (ref 49–135)
BUN/Creatinine Ratio: 9 (ref 9–23)
BUN: 11 mg/dL (ref 6–24)
Bilirubin Total: 0.5 mg/dL (ref 0.0–1.2)
CO2: 24 mmol/L (ref 20–29)
Calcium: 9.5 mg/dL (ref 8.7–10.2)
Chloride: 96 mmol/L (ref 96–106)
Creatinine, Ser: 1.26 mg/dL — ABNORMAL HIGH (ref 0.57–1.00)
Globulin, Total: 2.7 g/dL (ref 1.5–4.5)
Glucose: 101 mg/dL — ABNORMAL HIGH (ref 70–99)
Potassium: 3.7 mmol/L (ref 3.5–5.2)
Sodium: 136 mmol/L (ref 134–144)
Total Protein: 6.9 g/dL (ref 6.0–8.5)
eGFR: 50 mL/min/1.73 — ABNORMAL LOW (ref >59)

## 2023-12-19 LAB — CBC WITH DIFFERENTIAL/PLATELET
Basophils Absolute: 0.1 x10E3/uL (ref 0.0–0.2)
Basos: 1 %
EOS (ABSOLUTE): 0.1 x10E3/uL (ref 0.0–0.4)
Eos: 1 %
Hematocrit: 41.8 % (ref 34.0–46.6)
Hemoglobin: 13.6 g/dL (ref 11.1–15.9)
Immature Grans (Abs): 0.1 x10E3/uL (ref 0.0–0.1)
Immature Granulocytes: 1 %
Lymphocytes Absolute: 2.2 x10E3/uL (ref 0.7–3.1)
Lymphs: 21 %
MCH: 30 pg (ref 26.6–33.0)
MCHC: 32.5 g/dL (ref 31.5–35.7)
MCV: 92 fL (ref 79–97)
Monocytes Absolute: 0.5 x10E3/uL (ref 0.1–0.9)
Monocytes: 4 %
Neutrophils Absolute: 7.8 x10E3/uL — ABNORMAL HIGH (ref 1.4–7.0)
Neutrophils: 72 %
Platelets: 302 x10E3/uL (ref 150–450)
RBC: 4.54 x10E6/uL (ref 3.77–5.28)
RDW: 12.6 % (ref 11.7–15.4)
WBC: 10.7 x10E3/uL (ref 3.4–10.8)

## 2023-12-21 NOTE — Assessment & Plan Note (Addendum)
 Fatigue likely multifactorial, possibly stress-related and Adderall effects. - Encourage rest and fluid intake. - Ensure regular meals. - Continue current medications. - Order blood work to rule out other causes. Orders:   CBC with Differential/Platelet   Comprehensive metabolic panel with GFR

## 2023-12-29 ENCOUNTER — Other Ambulatory Visit: Payer: Self-pay | Admitting: Family Medicine

## 2023-12-29 MED ORDER — AMPHETAMINE-DEXTROAMPHET ER 30 MG PO CP24: 30.0000 mg | ORAL_CAPSULE | ORAL | 0 refills | Status: DC

## 2023-12-29 NOTE — Telephone Encounter (Signed)
 Copied from CRM [id number]. Topic: Clinical - Medication Refill >> Dec 29, 2023 12:03 PM Tiffini S wrote: Medication: amphetamine -dextroamphetamine  (ADDERALL XR) 30 MG 24 hr capsule  Has the patient contacted their pharmacy? Yes (Agent: If no, request that the patient contact the pharmacy for the refill. If patient does not wish to contact the pharmacy document the reason why and proceed with request.) (Agent: If yes, when and what did the pharmacy advise?)  This is the patient's preferred pharmacy:  [id number] - [place], [place] - [address] [facility] [place] [address] Phone: [phone] Fax: [phone]   Is this the correct pharmacy for this prescription? Yes If no, delete pharmacy and type the correct one.   Has the prescription been filled recently? Yes  Is the patient out of the medication? No  Has the patient been seen for an appointment in the last year OR does the patient have an upcoming appointment? Yes  Can we respond through MyChart? Yes  Agent: Please be advised that Rx refills may take up to 3 business days. We ask that you follow-up with your pharmacy.

## 2024-01-14 ENCOUNTER — Ambulatory Visit: Admitting: Family Medicine

## 2024-01-14 ENCOUNTER — Other Ambulatory Visit: Payer: Self-pay

## 2024-01-14 VITALS — BP 132/78 | HR 81 | Temp 98.2°F | Ht 63.0 in | Wt 210.0 lb

## 2024-01-14 DIAGNOSIS — F3131 Bipolar disorder, current episode depressed, mild: Secondary | ICD-10-CM

## 2024-01-14 DIAGNOSIS — E1169 Type 2 diabetes mellitus with other specified complication: Secondary | ICD-10-CM

## 2024-01-14 DIAGNOSIS — R5383 Other fatigue: Secondary | ICD-10-CM | POA: Diagnosis not present

## 2024-01-14 DIAGNOSIS — K0889 Other specified disorders of teeth and supporting structures: Secondary | ICD-10-CM | POA: Diagnosis not present

## 2024-01-14 DIAGNOSIS — E782 Mixed hyperlipidemia: Secondary | ICD-10-CM

## 2024-01-14 MED ORDER — TRAMADOL HCL 50 MG PO TABS: 50.0000 mg | ORAL_TABLET | Freq: Three times a day (TID) | ORAL | 0 refills | Status: DC | PRN

## 2024-01-14 MED ORDER — TIRZEPATIDE 7.5 MG/0.5ML ~~LOC~~ SOAJ: 7.5000 mg | SUBCUTANEOUS | 2 refills | Status: DC

## 2024-01-14 MED ORDER — ALPRAZOLAM 0.5 MG PO TABS: ORAL_TABLET | ORAL | 2 refills | Status: AC

## 2024-01-14 MED ORDER — LUMATEPERONE TOSYLATE 42 MG PO CAPS: 42.0000 mg | ORAL_CAPSULE | Freq: Every day | ORAL | Status: DC

## 2024-01-14 MED ORDER — AMOXICILLIN 500 MG PO CAPS: 500.0000 mg | ORAL_CAPSULE | Freq: Three times a day (TID) | ORAL | 0 refills | Status: DC

## 2024-01-14 NOTE — Assessment & Plan Note (Addendum)
 Increase mounjaro  7.5 mg weekly.  Recommend continue to work on eating healthy diet and exercise.  Orders:   tirzepatide  (MOUNJARO ) 7.5 MG/0.5ML Pen; Inject 7.5 mg into the skin once a week.

## 2024-01-14 NOTE — Progress Notes (Signed)
 Subjective:  Patient ID: Anna [name], female    DOB: 01/22/1971  Age: 53 y.o. MRN: [id number]  Chief Complaint  Patient presents with   Fatigue    Is unsure if anti depression medication is helping. Wants to try Auvelity.    HPI: Discussed the use of AI scribe software for clinical note transcription with the patient, who gave verbal consent to proceed.  History of Present Illness Anna [name] is a 53 year old female who presents with tooth pain and fatigue.  Odontalgia - Excruciating pain localized to one tooth for approximately one month - Pain is most severe upon waking and associated with throbbing headache - No antibiotics taken - Scheduled dental evaluation tomorrow after contacting three dentists; only one available before December  Fatigue and sleep disturbance - Significant fatigue described as 'sleepy tired' for three weeks - Difficulty sleeping through the night - Fatigue coincides with recent unintentional weight loss, noticed after weight dropped below 220 pounds  Mood and energy changes - Current medication regimen includes Paxil  at maximum dose taken nightly, which causes drowsiness - Adderall also taken at maximum dose - 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 - No extreme highs or lows in mood - Concern that Paxil  may not be adequately managing depression  Allergic rhinitis - History of sinus infections - Recent onset of cat allergy, previously not experienced - Uses over-the-counter allergy medication, which is effective and non-sedating - Uses Flonase   Constitutional and systemic symptoms - No fevers, chills, sweats, chest pain, breathing problems, bowel problems, or bladder issues - Blood test performed on October 9th       01/14/2024   10:22 AM 10/09/2023    7:45 AM 07/09/2023    7:50 AM 05/26/2023    3:45 PM 04/04/2023    7:37 AM  Depression screen PHQ 2/9  Decreased Interest 2 0 2 0 0  Down, Depressed, Hopeless 1 0 1 0 0   PHQ - 2 Score 3 0 3 0 0  Altered sleeping 3 0 3    Tired, decreased energy 3 0 0    Change in appetite 0 0 0    Feeling bad or failure about yourself  2 0 0    Trouble concentrating 2 0 0    Moving slowly or fidgety/restless 1 0 0    Suicidal thoughts 0 0 0    PHQ-9 Score 14  0  6     Difficult doing work/chores Very difficult Not difficult at all Not difficult at all       Data saved with a previous flowsheet row definition        10/09/2023    7:45 AM  Fall Risk   Falls in the past year? 0  Number falls in past yr: 0  Injury with Fall? 0  Risk for fall due to : No Fall Risks  Follow up Falls evaluation completed    Patient Care Team: [name] Clapper, MD as PCP - General (Family Medicine)   Review of Systems  Constitutional:  Positive for fatigue. Negative for chills and fever.  HENT:  Negative for congestion, ear pain and sore throat.   Respiratory:  Negative for cough and shortness of breath.   Cardiovascular:  Negative for chest pain.  Gastrointestinal:  Negative for abdominal pain, constipation, diarrhea, nausea and vomiting.  Genitourinary:  Negative for dysuria and urgency.  Musculoskeletal:  Negative for arthralgias and myalgias.  Skin:  Negative for rash.  Neurological:  Negative for dizziness and headaches.  Psychiatric/Behavioral:  Positive for decreased concentration, dysphoric mood and sleep disturbance. The patient is nervous/anxious.     Current Outpatient Medications on File Prior to Visit  Medication Sig Dispense Refill   albuterol  (VENTOLIN  HFA) 108 (90 Base) MCG/ACT inhaler Inhale 2 puffs into the lungs every 6 (six) hours as needed for wheezing or shortness of breath. 18 g 1   amLODipine -olmesartan  (AZOR ) 10-40 MG tablet TAKE 1 TABLET BY MOUTH EVERY DAY 90 tablet 1   amphetamine -dextroamphetamine  (ADDERALL XR) 30 MG 24 hr capsule Take 1 capsule (30 mg total) by mouth every morning. For ADHD ICD-10:  F90.2 30 capsule 0   cyclobenzaprine  (FLEXERIL ) 5 MG  tablet TAKE 1 TABLET BY MOUTH THREE TIMES A DAY AS NEEDED FOR MUSCLE SPASM 60 tablet 1   doxycycline  (VIBRAMYCIN ) 100 MG capsule Take 100 mg by mouth 2 (two) times daily.     fluticasone  (FLONASE ) 50 MCG/ACT nasal spray Place 2 sprays into both nostrils daily. 18 mL 2   omega-3 acid ethyl esters (LOVAZA ) 1 g capsule TAKE 1 CAPSULE BY MOUTH TWICE A DAY 180 capsule 0   ondansetron  (ZOFRAN ) 4 MG tablet Take 1 tablet (4 mg total) by mouth every 8 (eight) hours as needed for nausea or vomiting. 20 tablet 0   PARoxetine  (PAXIL -CR) 37.5 MG 24 hr tablet TAKE 1 TABLET BY MOUTH EVERY DAY 90 tablet 1   PRESCRIPTION MEDICATION [facility] Biest 5/5 (estriol/estradiol)/Progesterone/Testosterone Dissolve 1/2 troche between upper gumline and cheek BID-do not chew, swallow, or place under tongue.     rosuvastatin  (CRESTOR ) 20 MG tablet TAKE 1 TABLET BY MOUTH EVERY DAY 90 tablet 1   traZODone  (DESYREL ) 50 MG tablet TAKE 1 TABLET BY MOUTH EVERYDAY AT BEDTIME 90 tablet 3   triamcinolone  cream (KENALOG ) 0.1 % Apply 1 application. topically 2 (two) times daily. (Patient taking differently: Apply 1 application  topically 2 (two) times daily. As needed) 45 g 1   valACYclovir  (VALTREX ) 500 MG tablet TAKE 1 TABLET BY MOUTH TWICE A DAY 14 tablet 1   Vitamin D , Ergocalciferol , (DRISDOL ) 1.25 MG (50000 UNIT) CAPS capsule Take 1 capsule (50,000 Units total) by mouth every 7 (seven) days. 24 capsule 0   No current facility-administered medications on file prior to visit.   Past Medical History:  Diagnosis Date   ADD (attention deficit disorder)    Arthritis    Asthma    seasonal   Bipolar affective disorder, current episode depressed (HCC)    Diabetes (HCC) 07/10/2023   GERD (gastroesophageal reflux disease)    occ   History of recurrent UTIs 03/11/2012   Hypertension    Moderate persistent asthma with exacerbation 12/16/2021   Past Surgical History:  Procedure Laterality Date   ABDOMINAL HYSTERECTOMY   2010   endometriosis. Total hysterectomy   ANTERIOR CERVICAL DECOMP/DISCECTOMY FUSION N/A 07/13/2012   Procedure: ANTERIOR CERVICAL DECOMPRESSION/DISCECTOMY FUSION 1 LEVEL;  Surgeon: Arley SHAUNNA Helling, MD;  Location: [facility];  Service: Neurosurgery;  Laterality: N/A;  Anterior Cervical Decompression/Discectomy Cervical Three-Four   CARPAL TUNNEL RELEASE Right    CERVICAL DISC SURGERY  09   CHOLECYSTECTOMY      Family History  Problem Relation Age of Onset   Breast cancer Neg Hx    Social History   Socioeconomic History   Marital status: Married    Spouse name: Not on file   Number of children: Not on file   Years of education: Not on file  Highest education level: Not on file  Occupational History   Not on file  Tobacco Use   Smoking status: Never   Smokeless tobacco: Never  Vaping Use   Vaping status: Never Used  Substance and Sexual Activity   Alcohol use: No   Drug use: No    Comment: 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   Sexual activity: Not on file  Other Topics Concern   Not on file  Social History Narrative   Not on file   Social Drivers of Health   Financial Resource Strain: Low Risk  (07/09/2023)   Overall Financial Resource Strain (CARDIA)    Difficulty of Paying Living Expenses: Not hard at all  Food Insecurity: No Food Insecurity (07/09/2023)   Hunger Vital Sign    Worried About Running Out of Food in the Last Year: Never true    Ran Out of Food in the Last Year: Never true  Transportation Needs: No Transportation Needs (07/09/2023)   PRAPARE - Administrator, Civil Service (Medical): No    Lack of Transportation (Non-Medical): No  Physical Activity: Inactive (07/09/2023)   Exercise Vital Sign    Days of Exercise per Week: 0 days    Minutes of Exercise per Session: 0 min  Stress: No Stress Concern Present (07/09/2023)   [employer] of Occupational Health - Occupational Stress Questionnaire    Feeling of Stress : Not at all  Social  Connections: Moderately Integrated (07/09/2023)   Social Connection and Isolation Panel    Frequency of Communication with Friends and Family: More than three times a week    Frequency of Social Gatherings with Friends and Family: Three times a week    Attends Religious Services: More than 4 times per year    Active Member of Clubs or Organizations: No    Attends Banker Meetings: Never    Marital Status: Married    Objective:  BP 132/78   Pulse 81   Temp 98.2 F (36.8 C)   Ht 5' 3 (1.6 m)   Wt 210 lb (95.3 kg)   SpO2 98%   BMI 37.20 kg/m      01/14/2024    9:32 AM 12/18/2023    9:52 AM 10/09/2023    7:36 AM  BP/Weight  Systolic BP 132 118 110  Diastolic BP 78 70 60  Wt. (Lbs) 210 214 231  BMI 37.2 kg/m2 37.91 kg/m2 40.92 kg/m2    Physical Exam Vitals reviewed.  Constitutional:      Appearance: Normal appearance. She is obese.  HENT:     Mouth/Throat:      Comments: Tooth appears cracked. Tender.  Neck:     Vascular: No carotid bruit.  Cardiovascular:     Rate and Rhythm: Normal rate and regular rhythm.     Heart sounds: Normal heart sounds.  Pulmonary:     Effort: Pulmonary effort is normal. No respiratory distress.     Breath sounds: Normal breath sounds.  Abdominal:     General: Abdomen is flat. Bowel sounds are normal.     Palpations: Abdomen is soft.     Tenderness: There is no abdominal tenderness.  Neurological:     Mental Status: She is alert and oriented to person, place, and time.  Psychiatric:        Mood and Affect: Mood normal.        Behavior: Behavior normal.         Lab Results  Component Value Date  WBC 8.9 01/14/2024   HGB 12.6 01/14/2024   HCT 38.9 01/14/2024   PLT 286 01/14/2024   GLUCOSE 83 01/14/2024   CHOL 118 10/09/2023   TRIG 91 10/09/2023   HDL 47 10/09/2023   LDLCALC 53 10/09/2023   ALT 15 01/14/2024   AST 21 01/14/2024   NA 136 01/14/2024   K 4.1 01/14/2024   CL 98 01/14/2024   CREATININE 0.98  01/14/2024   BUN 8 01/14/2024   CO2 23 01/14/2024   TSH 0.939 01/14/2024   HGBA1C 5.7 (H) 10/09/2023    Results for orders placed or performed in visit on 01/14/24  TSH   Collection Time: 01/14/24 12:06 PM  Result Value Ref Range   TSH 0.939 0.450 - 4.500 uIU/mL  CBC with Differential/Platelet   Collection Time: 01/14/24 12:06 PM  Result Value Ref Range   WBC 8.9 3.4 - 10.8 x10E3/uL   RBC 4.26 3.77 - 5.28 x10E6/uL   Hemoglobin 12.6 11.1 - 15.9 g/dL   Hematocrit 61.0 65.9 - 46.6 %   MCV 91 79 - 97 fL   MCH 29.6 26.6 - 33.0 pg   MCHC 32.4 31.5 - 35.7 g/dL   RDW 87.2 88.2 - 84.5 %   Platelets 286 150 - 450 x10E3/uL   Neutrophils 49 Not Estab. %   Lymphs 42 Not Estab. %   Monocytes 6 Not Estab. %   Eos 2 Not Estab. %   Basos 1 Not Estab. %   Neutrophils Absolute 4.4 1.4 - 7.0 x10E3/uL   Lymphocytes Absolute 3.7 (H) 0.7 - 3.1 x10E3/uL   Monocytes Absolute 0.5 0.1 - 0.9 x10E3/uL   EOS (ABSOLUTE) 0.1 0.0 - 0.4 x10E3/uL   Basophils Absolute 0.1 0.0 - 0.2 x10E3/uL   Immature Granulocytes 0 Not Estab. %   Immature Grans (Abs) 0.0 0.0 - 0.1 x10E3/uL  Comprehensive metabolic panel with GFR   Collection Time: 01/14/24 12:06 PM  Result Value Ref Range   Glucose 83 70 - 99 mg/dL   BUN 8 6 - 24 mg/dL   Creatinine, Ser 9.01 0.57 - 1.00 mg/dL   eGFR 68 >40 fO/fpw/8.26   BUN/Creatinine Ratio 8 (L) 9 - 23   Sodium 136 134 - 144 mmol/L   Potassium 4.1 3.5 - 5.2 mmol/L   Chloride 98 96 - 106 mmol/L   CO2 23 20 - 29 mmol/L   Calcium  9.2 8.7 - 10.2 mg/dL   Total Protein 6.6 6.0 - 8.5 g/dL   Albumin 4.1 3.8 - 4.9 g/dL   Globulin, Total 2.5 1.5 - 4.5 g/dL   Bilirubin Total 0.4 0.0 - 1.2 mg/dL   Alkaline Phosphatase 89 49 - 135 IU/L   AST 21 0 - 40 IU/L   ALT 15 0 - 32 IU/L  .  Assessment & Plan:   Assessment & Plan Pain, dental Dental pain due to cracked tooth Experiencing severe pain from a cracked tooth, likely exacerbated by teeth clenching. Scheduled dental appointment for  further evaluation. - Prescribed antibiotic for dental pain. Orders:   amoxicillin  (AMOXIL ) 500 MG capsule; Take 1 capsule (500 mg total) by mouth 3 (three) times daily for 7 days.  Bipolar 1 disorder, depressed, mild (HCC) Current Paxil  regimen ineffective for depression.  Start on caplyta 42 mg daily. - Maintain follow up appointment for medication discussion. Orders:   ALPRAZolam  (XANAX ) 0.5 MG tablet; TAKE 1 TABLET BY MOUTH DAILY AS NEEDED FOR SEVERE ANXIETY   TSH   CBC with Differential/Platelet   Comprehensive  metabolic panel with GFR   lumateperone tosylate (CAPLYTA) 42 MG capsule; Take 1 capsule (42 mg total) by mouth daily.  Combined hyperlipidemia associated with type 2 diabetes mellitus (HCC) Increase mounjaro  7.5 mg weekly.  Recommend continue to work on eating healthy diet and exercise.  Orders:   tirzepatide  (MOUNJARO ) 7.5 MG/0.5ML Pen; Inject 7.5 mg into the skin once a week.  Other fatigue Persistent fatigue with sleep disturbances. Thyroid  function not recently checked, previous kidney function slightly abnormal. - Order thyroid  function tests. - Recheck blood count and chemistry panel, including kidney function. Orders:   TSH   CBC with Differential/Platelet   Comprehensive metabolic panel with GFR    Body mass index is 37.2 kg/m.     Meds ordered this encounter  Medications   ALPRAZolam  (XANAX ) 0.5 MG tablet    Sig: TAKE 1 TABLET BY MOUTH DAILY AS NEEDED FOR SEVERE ANXIETY    Dispense:  30 tablet    Refill:  2    This request is for a new prescription for a controlled substance as required by Federal/State law.   tirzepatide  (MOUNJARO ) 7.5 MG/0.5ML Pen    Sig: Inject 7.5 mg into the skin once a week.    Dispense:  2 mL    Refill:  2   lumateperone tosylate (CAPLYTA) 42 MG capsule    Sig: Take 1 capsule (42 mg total) by mouth daily.   amoxicillin  (AMOXIL ) 500 MG capsule    Sig: Take 1 capsule (500 mg total) by mouth 3 (three) times daily for 7  days.    Dispense:  21 capsule    Refill:  0   DISCONTD: traMADol (ULTRAM) 50 MG tablet    Sig: Take 1 tablet (50 mg total) by mouth every 8 (eight) hours as needed for up to 5 days.    Dispense:  15 tablet    Refill:  0    Orders Placed This Encounter  Procedures   TSH   CBC with Differential/Platelet   Comprehensive metabolic panel with GFR       Follow-up: Return in about 6 days (around 01/20/2024).  An After Visit Summary was printed and given to the patient.  Abigail Free, MD [facility] [phone]

## 2024-01-14 NOTE — Assessment & Plan Note (Addendum)
 Current Paxil  regimen ineffective for depression.  Start on caplyta 42 mg daily. - Maintain follow up appointment for medication discussion. Orders:   ALPRAZolam  (XANAX ) 0.5 MG tablet; TAKE 1 TABLET BY MOUTH DAILY AS NEEDED FOR SEVERE ANXIETY   TSH   CBC with Differential/Platelet   Comprehensive metabolic panel with GFR   lumateperone tosylate (CAPLYTA) 42 MG capsule; Take 1 capsule (42 mg total) by mouth daily.

## 2024-01-15 ENCOUNTER — Ambulatory Visit: Payer: Self-pay | Admitting: Family Medicine

## 2024-01-15 ENCOUNTER — Telehealth: Payer: Self-pay | Admitting: Family Medicine

## 2024-01-15 LAB — COMPREHENSIVE METABOLIC PANEL WITH GFR
ALT: 15 IU/L (ref 0–32)
AST: 21 IU/L (ref 0–40)
Albumin: 4.1 g/dL (ref 3.8–4.9)
Alkaline Phosphatase: 89 IU/L (ref 49–135)
BUN/Creatinine Ratio: 8 — ABNORMAL LOW (ref 9–23)
BUN: 8 mg/dL (ref 6–24)
Bilirubin Total: 0.4 mg/dL (ref 0.0–1.2)
CO2: 23 mmol/L (ref 20–29)
Calcium: 9.2 mg/dL (ref 8.7–10.2)
Chloride: 98 mmol/L (ref 96–106)
Creatinine, Ser: 0.98 mg/dL (ref 0.57–1.00)
Globulin, Total: 2.5 g/dL (ref 1.5–4.5)
Glucose: 83 mg/dL (ref 70–99)
Potassium: 4.1 mmol/L (ref 3.5–5.2)
Sodium: 136 mmol/L (ref 134–144)
Total Protein: 6.6 g/dL (ref 6.0–8.5)
eGFR: 68 mL/min/1.73 (ref >59)

## 2024-01-15 LAB — CBC WITH DIFFERENTIAL/PLATELET
Basophils Absolute: 0.1 x10E3/uL (ref 0.0–0.2)
Basos: 1 %
EOS (ABSOLUTE): 0.1 x10E3/uL (ref 0.0–0.4)
Eos: 2 %
Hematocrit: 38.9 % (ref 34.0–46.6)
Hemoglobin: 12.6 g/dL (ref 11.1–15.9)
Immature Grans (Abs): 0 x10E3/uL (ref 0.0–0.1)
Immature Granulocytes: 0 %
Lymphocytes Absolute: 3.7 x10E3/uL — ABNORMAL HIGH (ref 0.7–3.1)
Lymphs: 42 %
MCH: 29.6 pg (ref 26.6–33.0)
MCHC: 32.4 g/dL (ref 31.5–35.7)
MCV: 91 fL (ref 79–97)
Monocytes Absolute: 0.5 x10E3/uL (ref 0.1–0.9)
Monocytes: 6 %
Neutrophils Absolute: 4.4 x10E3/uL (ref 1.4–7.0)
Neutrophils: 49 %
Platelets: 286 x10E3/uL (ref 150–450)
RBC: 4.26 x10E6/uL (ref 3.77–5.28)
RDW: 12.7 % (ref 11.7–15.4)
WBC: 8.9 x10E3/uL (ref 3.4–10.8)

## 2024-01-15 LAB — TSH: TSH: 0.939 u[IU]/mL (ref 0.450–4.500)

## 2024-01-15 NOTE — Telephone Encounter (Signed)
 Copied from CRM [id number]. Topic: Clinical - Medication Question >> Jan 15, 2024  7:56 AM Kendralyn S wrote: Reason for CRM: Caplyta is making her very groggy in the mornings

## 2024-01-17 ENCOUNTER — Encounter: Payer: Self-pay | Admitting: Family Medicine

## 2024-01-17 DIAGNOSIS — K0889 Other specified disorders of teeth and supporting structures: Secondary | ICD-10-CM | POA: Insufficient documentation

## 2024-01-17 NOTE — Assessment & Plan Note (Signed)
 Dental pain due to cracked tooth Experiencing severe pain from a cracked tooth, likely exacerbated by teeth clenching. Scheduled dental appointment for further evaluation. - Prescribed antibiotic for dental pain. Orders:   amoxicillin  (AMOXIL ) 500 MG capsule; Take 1 capsule (500 mg total) by mouth 3 (three) times daily for 7 days.

## 2024-01-17 NOTE — Assessment & Plan Note (Signed)
 Persistent fatigue with sleep disturbances. Thyroid  function not recently checked, previous kidney function slightly abnormal. - Order thyroid  function tests. - Recheck blood count and chemistry panel, including kidney function. Orders:   TSH   CBC with Differential/Platelet   Comprehensive metabolic panel with GFR

## 2024-01-20 ENCOUNTER — Encounter: Payer: Self-pay | Admitting: Family Medicine

## 2024-01-20 ENCOUNTER — Ambulatory Visit (INDEPENDENT_AMBULATORY_CARE_PROVIDER_SITE_OTHER): Admitting: Family Medicine

## 2024-01-20 VITALS — BP 132/82 | HR 68 | Temp 98.0°F | Resp 16 | Ht 63.0 in | Wt 209.6 lb

## 2024-01-20 DIAGNOSIS — E1169 Type 2 diabetes mellitus with other specified complication: Secondary | ICD-10-CM

## 2024-01-20 DIAGNOSIS — F3131 Bipolar disorder, current episode depressed, mild: Secondary | ICD-10-CM

## 2024-01-20 DIAGNOSIS — F902 Attention-deficit hyperactivity disorder, combined type: Secondary | ICD-10-CM

## 2024-01-20 DIAGNOSIS — E782 Mixed hyperlipidemia: Secondary | ICD-10-CM

## 2024-01-20 DIAGNOSIS — I10 Essential (primary) hypertension: Secondary | ICD-10-CM

## 2024-01-20 DIAGNOSIS — Z23 Encounter for immunization: Secondary | ICD-10-CM

## 2024-01-20 LAB — POCT LIPID PANEL
HDL: 56
LDL: 47
Non-HDL: 61
TC: 118
TRG: 74

## 2024-01-20 LAB — POCT GLYCOSYLATED HEMOGLOBIN (HGB A1C): HbA1c POC (<> result, manual entry): 5.2 % (ref 4.0–5.6)

## 2024-01-20 NOTE — Assessment & Plan Note (Signed)
 Orders:    POCT glycosylated hemoglobin (Hb A1C)

## 2024-01-20 NOTE — Assessment & Plan Note (Signed)
Well controlled.  No medication changes recommended. Continue healthy diet and exercise.  

## 2024-01-20 NOTE — Assessment & Plan Note (Signed)
 Well controlled.  Continue Adderall XR 30 mg every morning.

## 2024-01-20 NOTE — Assessment & Plan Note (Signed)
 Persistent depression with excessive daytime somnolence. Caplyta started a week ago causing grogginess. Lower dose considered to reduce side effects. - Provided samples of lower dose Caplyta. - Scheduled follow-up in one month.

## 2024-01-20 NOTE — Assessment & Plan Note (Signed)
 Well controlled. At goal.  No changes to medicines.  Continue rosuvastatin  20 mg daily.  Continue Lovaza  1 g 1 capsule twice daily.  Continue to work on eating a healthy diet and exercise.   Orders:   POCT Lipid Panel

## 2024-01-20 NOTE — Progress Notes (Signed)
 Subjective:  Patient ID: Anna [name], female    DOB: Aug 31, 1970  Age: 53 y.o. MRN: [id number]  Chief Complaint  Patient presents with   Medical Management of Chronic Issues    HPI: Discussed the use of AI scribe software for clinical note transcription with the patient, who gave verbal consent to proceed.  History of Present Illness Anna [name] is a 53 year old female with depression and bipolar disorder who presents with excessive daytime grogginess.  Daytime somnolence and fatigue - Excessive daytime grogginess persists despite improved nighttime sleep - Increased somnolence since starting Caplyta one week ago - Discontinued trazodone  after two nights due to sedative effects - Not currently taking any other sedating medications - Has not taken her 'nerve pill' recently - Excessive sleepiness limits ability to exercise  Mood disturbance - Ongoing depression without suicidal ideation - Currently taking Paxil  CR 37.5 mg for mood stabilization - History of bipolar disorder  Attention deficit hyperactivity disorder (adhd) - Currently taking Adderall 30 mg daily  Hypertension - Currently taking amlodipine /olmesartan  10/40 mg daily  Hyperlipidemia - Currently taking rosuvastatin  20 mg daily - Currently taking Lovaza  1 g twice daily  Diabetes mellitus - Currently taking Mounjaro  7.5 mg weekly - Doing well with healthy eating  Dental infection - Tooth status improving after root canal and antibiotics  Review of systems - No fevers, chills, sweats, earaches, sore throat, stuffy nose, chest pain, shortness of breath, headaches, dizziness, bowel or bladder problems, abdominal pain, muscle pain, or joint pain  Preventive care - Has not visited an eye doctor recently       01/20/2024    8:22 AM 01/14/2024   10:22 AM 10/09/2023    7:45 AM 07/09/2023    7:50 AM 05/26/2023    3:45 PM  Depression screen PHQ 2/9  Decreased Interest 2 2 0 2 0  Down, Depressed, Hopeless 0 1 0 1  0  PHQ - 2 Score 2 3 0 3 0  Altered sleeping 2 3 0 3   Tired, decreased energy 3 3 0 0   Change in appetite 0 0 0 0   Feeling bad or failure about yourself  0 2 0 0   Trouble concentrating 1 2 0 0   Moving slowly or fidgety/restless 0 1 0 0   Suicidal thoughts 0 0 0 0   PHQ-9 Score 8 14  0  6    Difficult doing work/chores Somewhat difficult Very difficult Not difficult at all Not difficult at all      Data saved with a previous flowsheet row definition        10/09/2023    7:45 AM  Fall Risk   Falls in the past year? 0  Number falls in past yr: 0  Injury with Fall? 0  Risk for fall due to : No Fall Risks  Follow up Falls evaluation completed    Patient Care Team: [name] Clapper, MD as PCP - General (Family Medicine)   Review of Systems  Constitutional:  Positive for fatigue. Negative for chills and fever.  HENT:  Negative for congestion, ear pain and sore throat.   Respiratory:  Negative for cough and shortness of breath.   Cardiovascular:  Negative for chest pain.  Gastrointestinal:  Negative for abdominal pain, constipation, diarrhea, nausea and vomiting.  Genitourinary:  Negative for dysuria and urgency.  Musculoskeletal:  Negative for arthralgias and myalgias.  Neurological:  Negative for dizziness and headaches.  Psychiatric/Behavioral:  Positive for  dysphoric mood and sleep disturbance. The patient is not nervous/anxious.     Current Outpatient Medications on File Prior to Visit  Medication Sig Dispense Refill   albuterol  (VENTOLIN  HFA) 108 (90 Base) MCG/ACT inhaler Inhale 2 puffs into the lungs every 6 (six) hours as needed for wheezing or shortness of breath. 18 g 1   ALPRAZolam  (XANAX ) 0.5 MG tablet TAKE 1 TABLET BY MOUTH DAILY AS NEEDED FOR SEVERE ANXIETY 30 tablet 2   amLODipine -olmesartan  (AZOR ) 10-40 MG tablet TAKE 1 TABLET BY MOUTH EVERY DAY 90 tablet 1   amphetamine -dextroamphetamine  (ADDERALL XR) 30 MG 24 hr capsule Take 1 capsule (30 mg total) by mouth  every morning. For ADHD ICD-10:  F90.2 30 capsule 0   cyclobenzaprine  (FLEXERIL ) 5 MG tablet TAKE 1 TABLET BY MOUTH THREE TIMES A DAY AS NEEDED FOR MUSCLE SPASM 60 tablet 1   fluticasone  (FLONASE ) 50 MCG/ACT nasal spray Place 2 sprays into both nostrils daily. 18 mL 2   lumateperone tosylate (CAPLYTA) 42 MG capsule Take 1 capsule (42 mg total) by mouth daily.     omega-3 acid ethyl esters (LOVAZA ) 1 g capsule TAKE 1 CAPSULE BY MOUTH TWICE A DAY 180 capsule 0   ondansetron  (ZOFRAN ) 4 MG tablet Take 1 tablet (4 mg total) by mouth every 8 (eight) hours as needed for nausea or vomiting. 20 tablet 0   PARoxetine  (PAXIL -CR) 37.5 MG 24 hr tablet TAKE 1 TABLET BY MOUTH EVERY DAY 90 tablet 1   PRESCRIPTION MEDICATION [facility] Biest 5/5 (estriol/estradiol)/Progesterone/Testosterone Dissolve 1/2 troche between upper gumline and cheek BID-do not chew, swallow, or place under tongue.     rosuvastatin  (CRESTOR ) 20 MG tablet TAKE 1 TABLET BY MOUTH EVERY DAY 90 tablet 1   tirzepatide  (MOUNJARO ) 7.5 MG/0.5ML Pen Inject 7.5 mg into the skin once a week. 2 mL 2   traZODone  (DESYREL ) 50 MG tablet TAKE 1 TABLET BY MOUTH EVERYDAY AT BEDTIME 90 tablet 3   triamcinolone  cream (KENALOG ) 0.1 % Apply 1 application. topically 2 (two) times daily. (Patient taking differently: Apply 1 application  topically 2 (two) times daily. As needed) 45 g 1   Vitamin D , Ergocalciferol , (DRISDOL ) 1.25 MG (50000 UNIT) CAPS capsule Take 1 capsule (50,000 Units total) by mouth every 7 (seven) days. 24 capsule 0   No current facility-administered medications on file prior to visit.   Past Medical History:  Diagnosis Date   ADD (attention deficit disorder)    Arthritis    Asthma    seasonal   Bipolar affective disorder, current episode depressed (HCC)    Diabetes (HCC) 07/10/2023   GERD (gastroesophageal reflux disease)    occ   History of recurrent UTIs 03/11/2012   Hypertension    Moderate persistent asthma with  exacerbation 12/16/2021   Past Surgical History:  Procedure Laterality Date   ABDOMINAL HYSTERECTOMY  2010   endometriosis. Total hysterectomy   ANTERIOR CERVICAL DECOMP/DISCECTOMY FUSION N/A 07/13/2012   Procedure: ANTERIOR CERVICAL DECOMPRESSION/DISCECTOMY FUSION 1 LEVEL;  Surgeon: Arley SHAUNNA Helling, MD;  Location: [facility];  Service: Neurosurgery;  Laterality: N/A;  Anterior Cervical Decompression/Discectomy Cervical Three-Four   CARPAL TUNNEL RELEASE Right    CERVICAL DISC SURGERY  09   CHOLECYSTECTOMY      Family History  Problem Relation Age of Onset   Breast cancer Neg Hx    Social History   Socioeconomic History   Marital status: Married    Spouse name: Not on file   Number of children:  Not on file   Years of education: Not on file   Highest education level: Not on file  Occupational History   Not on file  Tobacco Use   Smoking status: Never   Smokeless tobacco: Never  Vaping Use   Vaping status: Never Used  Substance and Sexual Activity   Alcohol use: No   Drug use: No    Comment: 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   Sexual activity: Not on file  Other Topics Concern   Not on file  Social History Narrative   Not on file   Social Drivers of Health   Financial Resource Strain: Low Risk  (07/09/2023)   Overall Financial Resource Strain (CARDIA)    Difficulty of Paying Living Expenses: Not hard at all  Food Insecurity: No Food Insecurity (07/09/2023)   Hunger Vital Sign    Worried About Running Out of Food in the Last Year: Never true    Ran Out of Food in the Last Year: Never true  Transportation Needs: No Transportation Needs (07/09/2023)   PRAPARE - Administrator, Civil Service (Medical): No    Lack of Transportation (Non-Medical): No  Physical Activity: Inactive (07/09/2023)   Exercise Vital Sign    Days of Exercise per Week: 0 days    Minutes of Exercise per Session: 0 min  Stress: No Stress Concern Present (07/09/2023)   [employer] of  Occupational Health - Occupational Stress Questionnaire    Feeling of Stress : Not at all  Social Connections: Moderately Integrated (07/09/2023)   Social Connection and Isolation Panel    Frequency of Communication with Friends and Family: More than three times a week    Frequency of Social Gatherings with Friends and Family: Three times a week    Attends Religious Services: More than 4 times per year    Active Member of Clubs or Organizations: No    Attends Banker Meetings: Never    Marital Status: Married    Objective:  BP 132/82   Pulse 68   Temp 98 F (36.7 C) (Temporal)   Resp 16   Ht 5' 3 (1.6 m)   Wt 209 lb 9.6 oz (95.1 kg)   SpO2 99%   BMI 37.13 kg/m      01/20/2024    7:34 AM 01/14/2024    9:32 AM 12/18/2023    9:52 AM  BP/Weight  Systolic BP 132 132 118  Diastolic BP 82 78 70  Wt. (Lbs) 209.6 210 214  BMI 37.13 kg/m2 37.2 kg/m2 37.91 kg/m2    Physical Exam Vitals reviewed.  Constitutional:      Appearance: Normal appearance.  Cardiovascular:     Rate and Rhythm: Normal rate and regular rhythm.     Heart sounds: Normal heart sounds.  Pulmonary:     Effort: Pulmonary effort is normal.     Breath sounds: Normal breath sounds.  Neurological:     Mental Status: She is oriented to person, place, and time.  Psychiatric:        Mood and Affect: Mood normal.        Behavior: Behavior normal.         Lab Results  Component Value Date   WBC 8.9 01/14/2024   HGB 12.6 01/14/2024   HCT 38.9 01/14/2024   PLT 286 01/14/2024   GLUCOSE 83 01/14/2024   CHOL 118 10/09/2023   TRIG 91 10/09/2023   HDL 47 10/09/2023   LDLCALC 53 10/09/2023  ALT 15 01/14/2024   AST 21 01/14/2024   NA 136 01/14/2024   K 4.1 01/14/2024   CL 98 01/14/2024   CREATININE 0.98 01/14/2024   BUN 8 01/14/2024   CO2 23 01/14/2024   TSH 0.939 01/14/2024   HGBA1C 5.2 01/20/2024    Results for orders placed or performed in visit on 01/20/24  POCT Lipid Panel    Collection Time: 01/20/24  8:16 AM  Result Value Ref Range   TC 118    HDL 56    TRG 74    LDL 47    Non-HDL 61    TC/HDL    POCT glycosylated hemoglobin (Hb A1C)   Collection Time: 01/20/24  8:17 AM  Result Value Ref Range   Hemoglobin A1C     HbA1c POC (<> result, manual entry) 5.2 4.0 - 5.6 %   HbA1c, POC (prediabetic range)     HbA1c, POC (controlled diabetic range)    .  Assessment & Plan:   Assessment & Plan Bipolar 1 disorder, depressed, mild (HCC) Persistent depression with excessive daytime somnolence. Caplyta started a week ago causing grogginess. Lower dose considered to reduce side effects. - Provided samples of lower dose Caplyta. - Scheduled follow-up in one month.    Combined hyperlipidemia associated with type 2 diabetes mellitus (HCC)  Orders:   POCT glycosylated hemoglobin (Hb A1C)  Attention deficit hyperactivity disorder (ADHD), combined type Well controlled.  Continue Adderall XR 30 mg every morning.      Mixed hyperlipidemia Well controlled. At goal.  No changes to medicines.  Continue rosuvastatin  20 mg daily.  Continue Lovaza  1 g 1 capsule twice daily.  Continue to work on eating a healthy diet and exercise.   Orders:   POCT Lipid Panel   Essential hypertension, benign Well controlled.  No medication changes recommended. Continue azor  to 10/40 mg daily.   Continue healthy diet and exercise.        Encounter for immunization  Orders:   Varicella-zoster vaccine IM    Body mass index is 37.13 kg/m.    No orders of the defined types were placed in this encounter.   Orders Placed This Encounter  Procedures   Varicella-zoster vaccine IM   POCT glycosylated hemoglobin (Hb A1C)   POCT Lipid Panel       Follow-up: Return in about 4 weeks (around 02/17/2024).  An After Visit Summary was printed and given to the patient.  Abigail Free, MD [facility] [phone]

## 2024-01-23 ENCOUNTER — Ambulatory Visit: Payer: Self-pay

## 2024-01-23 NOTE — Telephone Encounter (Signed)
 FYI Only or Action Required?: Action required by provider: request for appointment and update on patient condition.  Patient was last seen in primary care on 01/20/2024 by Sherre Clapper, MD.  Called Nurse Triage reporting Depression.  Symptoms began chronic.  Interventions attempted: Prescription medications: as prescribed, no missed doses.  Symptoms are: gradually worsening.  Triage Disposition: See Physician Within 24 Hours  Patient/caregiver understands and will follow disposition?: No, refuses disposition   Copied from CRM #[id number]. Topic: Clinical - Red Word Triage >> Jan 23, 2024 11:35 AM Willma SAUNDERS wrote: Red Word that prompted transfer to Nurse Triage: Patient states her depression the last few days has gotten worse. Has been unable to make it to work. Reason for Disposition  [1] Depression AND [2] getting worse (e.g., sleeping poorly, less able to do activities of daily living)  Answer Assessment - Initial Assessment Questions Additional info: [place] is not helpful, making her very sleeping. Requesting appointment with pcp. No appointments are available with pcp until January. Patient has a follow up scheduled 02/20/24. Requesting work in acute visit with pcp asap.    1. CONCERN: What happened that made you call today?     Worsening depression-having difficulty getting to work  2. DEPRESSION SYMPTOM SCREENING: How are you feeling overall? (e.g., decreased energy, increased sleeping or difficulty sleeping, difficulty concentrating, feelings of sadness, guilt, hopelessness, or worthlessness)     Depressed mood  3. RISK OF HARM - SUICIDAL IDEATION:  Do you ever have thoughts of hurting or killing yourself?  (e.g., yes, no, no but preoccupation with thoughts about death)     Denies  4. RISK OF HARM - HOMICIDAL IDEATION:  Do you ever have thoughts of hurting or killing someone else?  (e.g., yes, no, no but preoccupation with thoughts about death)     Denies  5.  FUNCTIONAL IMPAIRMENT: How have things been going for you overall? Have you had more difficulty than usual doing your normal daily activities?  (e.g., better, same, worse; self-care, school, work, interactions)     Difficulty getting to work 6. SUPPORT: Who is with you now? Who do you live with? Do you have family or friends who you can talk to?      family 7. THERAPIST: Do you have a counselor or therapist? If Yes, ask: What is their name?     Has not found one she trust  8. STRESSORS: Has there been any new stress or recent changes in your life?     no 9. ALCOHOL USE OR SUBSTANCE USE (DRUG USE): Do you drink alcohol or use any illegal drugs?     denies 10. OTHER: Do you have any other physical symptoms right now? (e.g., fever)       Denies  Protocols used: Depression-A-AH

## 2024-01-26 ENCOUNTER — Telehealth: Payer: Self-pay | Admitting: Family Medicine

## 2024-01-26 ENCOUNTER — Encounter: Payer: Self-pay | Admitting: Family Medicine

## 2024-01-26 VITALS — Ht 63.0 in | Wt 209.0 lb

## 2024-01-26 DIAGNOSIS — F314 Bipolar disorder, current episode depressed, severe, without psychotic features: Secondary | ICD-10-CM | POA: Diagnosis not present

## 2024-01-26 DIAGNOSIS — R1013 Epigastric pain: Secondary | ICD-10-CM | POA: Diagnosis not present

## 2024-01-26 DIAGNOSIS — R110 Nausea: Secondary | ICD-10-CM | POA: Diagnosis not present

## 2024-01-26 MED ORDER — LAMOTRIGINE 25 MG PO TABS: ORAL_TABLET | ORAL | 0 refills | Status: DC

## 2024-01-26 MED ORDER — PROMETHAZINE HCL 25 MG PO TABS: 25.0000 mg | ORAL_TABLET | Freq: Three times a day (TID) | ORAL | 1 refills | Status: AC | PRN

## 2024-01-26 MED ORDER — VORTIOXETINE HBR 10 MG PO TABS: 10.0000 mg | ORAL_TABLET | Freq: Every day | ORAL | Status: DC

## 2024-01-26 NOTE — Assessment & Plan Note (Signed)
 Prescription for Phenergan  sent for nausea. If worsening abdominal pain, nausea, vomiting I would recommend patient get an in person appointment. Likely secondary to caplyta.

## 2024-01-26 NOTE — Assessment & Plan Note (Addendum)
 Bipolar depression with current severe depression: Start a mood stabilizer called Lamictal 25 mg once daily in a.m. x 1 week, then increase to twice daily x 1 week, then increase to 2 twice daily. Discontinue Caplyta.  Placed on list of allergies/intolerances. Recommend decrease Paxil  CR to 25 mg daily for 1 week, then discontinue. Start on Trintellix 10 mg once daily at the same time as you start the Lamictal and decrease your Paxil  CR. HOLD TRAZODONE .  Recommend off work returning on Wednesday.  If needs to be out longer patient needs to call back for a note.

## 2024-01-26 NOTE — Assessment & Plan Note (Addendum)
 Prescription for Phenergan  sent for nausea. If worsening abdominal pain, nausea, vomiting I would recommend patient get an in person appointment. Likely secondary to caplyta.

## 2024-01-26 NOTE — Patient Instructions (Addendum)
 Bipolar depression with current severe depression: Start a mood stabilizer called Lamictal 25 mg once daily in a.m. x 1 week, then increase to twice daily x 1 week, then increase to 2 twice daily. Discontinue Caplyta.  Placed on list of allergies/intolerances. Recommend decrease Paxil  CR to 25 mg daily for 1 week, then discontinue. Start on Trintellix 10 mg once daily at the same time as you start the Lamictal and decrease your Paxil  CR. HOLD TRAZODONE  Recommend off work returning on Wednesday.  If needs to be out longer patient needs to call back for a note.  Prescription for Phenergan  sent for nausea. If worsening abdominal pain, nausea, vomiting I would recommend patient get an in person appointment.                          Contains text generated by [employer].

## 2024-01-26 NOTE — Progress Notes (Signed)
 Virtual Visit via Video Note   This visit type was conducted per patient request This format is felt to be appropriate for this patient at this time.  All issues noted in this document were discussed and addressed.  A limited physical exam was performed with this format.  A verbal consent was obtained for the virtual visit.   Date:  01/26/2024   ID:  Anna [name], DOB 07-31-1970, MRN [id number]  Patient Location: Home Provider Location: Office/Clinic  PCP:  Free Clapper, MD   Chief Complaint  Patient presents with   Worsening depression     History of Present Illness:    Patient is a 53 yo female with history of bipolar depression, who has had worsening severe depression over the last month. I saw her earlier in the month and put her on Caplyta 42 mg nightly.  Discontinued her Vraylar .  Patient returned last week and was having significant somnolence.  We changed her to the lower dose, however she continued to be very sleepy.  Her depression is no better.  She has missed work Thursday, Friday, and today.  She denies suicidal ideation.  Denies mania.  Patient is currently on Paxil  CR 37.5 mg daily.  She does not feel like this is working anymore.  Previously we tried her on a higher dose of Vraylar  however it also made her sleepy.  In the past she has seen psychiatry but has been discouraged by her interactions with them.  She is willing to see a psychiatry but wanted to know if we can try something else first.  In addition over the last 24 to 48 hours she has been having nausea but no vomiting.  She is having some upper abdominal discomfort.  She had 1 episode of loose stool last night.  She discontinued the Caplyta last night.  This is noted to have some nausea and vomiting as side effects.  She did take some cough medicine that had Phenergan  ended in the hopes that would help her nausea which she did.  She was requesting some nausea medicine.  Past Medical History:  Diagnosis Date   ADD  (attention deficit disorder)    Arthritis    Asthma    seasonal   Bipolar affective disorder, current episode depressed (HCC)    Diabetes (HCC) 07/10/2023   GERD (gastroesophageal reflux disease)    occ   History of recurrent UTIs 03/11/2012   Hypertension    Moderate persistent asthma with exacerbation 12/16/2021    Past Surgical History:  Procedure Laterality Date   ABDOMINAL HYSTERECTOMY  2010   endometriosis. Total hysterectomy   ANTERIOR CERVICAL DECOMP/DISCECTOMY FUSION N/A 07/13/2012   Procedure: ANTERIOR CERVICAL DECOMPRESSION/DISCECTOMY FUSION 1 LEVEL;  Surgeon: Arley SHAUNNA Helling, MD;  Location: [facility];  Service: Neurosurgery;  Laterality: N/A;  Anterior Cervical Decompression/Discectomy Cervical Three-Four   CARPAL TUNNEL RELEASE Right    CERVICAL DISC SURGERY  09   CHOLECYSTECTOMY      Family History  Problem Relation Age of Onset   Breast cancer Neg Hx     Social History   Socioeconomic History   Marital status: Married    Spouse name: Not on file   Number of children: Not on file   Years of education: Not on file   Highest education level: Not on file  Occupational History   Not on file  Tobacco Use   Smoking status: Never   Smokeless tobacco: Never  Vaping Use   Vaping status:  Never Used  Substance and Sexual Activity   Alcohol use: No   Drug use: No    Comment: 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   Sexual activity: Not on file  Other Topics Concern   Not on file  Social History Narrative   Not on file   Social Drivers of Health   Financial Resource Strain: Low Risk  (07/09/2023)   Overall Financial Resource Strain (CARDIA)    Difficulty of Paying Living Expenses: Not hard at all  Food Insecurity: No Food Insecurity (07/09/2023)   Hunger Vital Sign    Worried About Running Out of Food in the Last Year: Never true    Ran Out of Food in the Last Year: Never true  Transportation Needs: No Transportation Needs (07/09/2023)   PRAPARE - Therapist, Art (Medical): No    Lack of Transportation (Non-Medical): No  Physical Activity: Inactive (07/09/2023)   Exercise Vital Sign    Days of Exercise per Week: 0 days    Minutes of Exercise per Session: 0 min  Stress: No Stress Concern Present (07/09/2023)   [employer] of Occupational Health - Occupational Stress Questionnaire    Feeling of Stress : Not at all  Social Connections: Moderately Integrated (07/09/2023)   Social Connection and Isolation Panel    Frequency of Communication with Friends and Family: More than three times a week    Frequency of Social Gatherings with Friends and Family: Three times a week    Attends Religious Services: More than 4 times per year    Active Member of Clubs or Organizations: No    Attends Banker Meetings: Never    Marital Status: Married  Catering Manager Violence: Not At Risk (07/09/2023)   Humiliation, Afraid, Rape, and Kick questionnaire    Fear of Current or Ex-Partner: No    Emotionally Abused: No    Physically Abused: No    Sexually Abused: No    Outpatient Medications Prior to Visit  Medication Sig Dispense Refill   albuterol  (VENTOLIN  HFA) 108 (90 Base) MCG/ACT inhaler Inhale 2 puffs into the lungs every 6 (six) hours as needed for wheezing or shortness of breath. 18 g 1   ALPRAZolam  (XANAX ) 0.5 MG tablet TAKE 1 TABLET BY MOUTH DAILY AS NEEDED FOR SEVERE ANXIETY 30 tablet 2   amLODipine -olmesartan  (AZOR ) 10-40 MG tablet TAKE 1 TABLET BY MOUTH EVERY DAY 90 tablet 1   amphetamine -dextroamphetamine  (ADDERALL XR) 30 MG 24 hr capsule Take 1 capsule (30 mg total) by mouth every morning. For ADHD ICD-10:  F90.2 30 capsule 0   cyclobenzaprine  (FLEXERIL ) 5 MG tablet TAKE 1 TABLET BY MOUTH THREE TIMES A DAY AS NEEDED FOR MUSCLE SPASM 60 tablet 1   fluticasone  (FLONASE ) 50 MCG/ACT nasal spray Place 2 sprays into both nostrils daily. 18 mL 2   omega-3 acid ethyl esters (LOVAZA ) 1 g capsule TAKE 1 CAPSULE BY MOUTH  TWICE A DAY 180 capsule 0   PRESCRIPTION MEDICATION [facility] Biest 5/5 (estriol/estradiol)/Progesterone/Testosterone Dissolve 1/2 troche between upper gumline and cheek BID-do not chew, swallow, or place under tongue.     rosuvastatin  (CRESTOR ) 20 MG tablet TAKE 1 TABLET BY MOUTH EVERY DAY 90 tablet 1   tirzepatide  (MOUNJARO ) 7.5 MG/0.5ML Pen Inject 7.5 mg into the skin once a week. 2 mL 2   triamcinolone  cream (KENALOG ) 0.1 % Apply 1 application. topically 2 (two) times daily. (Patient taking differently: Apply 1 application  topically 2 (two) times  daily. As needed) 45 g 1   Vitamin D , Ergocalciferol , (DRISDOL ) 1.25 MG (50000 UNIT) CAPS capsule Take 1 capsule (50,000 Units total) by mouth every 7 (seven) days. 24 capsule 0   PARoxetine  (PAXIL -CR) 37.5 MG 24 hr tablet TAKE 1 TABLET BY MOUTH EVERY DAY 90 tablet 1   traZODone  (DESYREL ) 50 MG tablet TAKE 1 TABLET BY MOUTH EVERYDAY AT BEDTIME 90 tablet 3   lumateperone tosylate (CAPLYTA) 42 MG capsule Take 1 capsule (42 mg total) by mouth daily. (Patient not taking: Reported on 01/26/2024)     ondansetron  (ZOFRAN ) 4 MG tablet Take 1 tablet (4 mg total) by mouth every 8 (eight) hours as needed for nausea or vomiting. (Patient not taking: Reported on 01/26/2024) 20 tablet 0   No facility-administered medications prior to visit.    Allergies  Allergen Reactions   Avelox [Moxifloxacin Hcl In Nacl] Anaphylaxis   Lactose Intolerance (Gi) Diarrhea   Tape Other (See Comments)    If tape on a while will pull off skin   Caplyta [Lumateperone] Nausea Only    Somnolence. Abdominal discomfort.     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 Negative for constipation, diarrhea  and vomiting.  Psychiatric/Behavioral:  Positive for depression. Negative for suicidal ideas. The patient is not nervous/anxious.      Labs/Other Tests and Data Reviewed:    Recent Labs: 01/14/2024: ALT 15; BUN 8; Creatinine, Ser 0.98; Hemoglobin 12.6; Platelets 286; Potassium 4.1; Sodium 136; TSH 0.939   Recent Lipid Panel Lab Results  Component Value Date/Time   CHOL 118 10/09/2023 08:20 AM   TRIG 91 10/09/2023 08:20 AM   HDL 47 10/09/2023 08:20 AM   CHOLHDL 2.5 10/09/2023 08:20 AM   CHOLHDL 3.7 08/25/2017 06:29 AM   LDLCALC 53 10/09/2023 08:20 AM    Wt Readings from Last 3 Encounters:  01/26/24 209 lb (94.8 kg)  01/20/24 209 lb 9.6 oz (95.1 kg)  01/14/24 210 lb (95.3 kg)     Objective:    Vital Signs:  Ht 5' 3 (1.6 m)   Wt 209 lb (94.8 kg)   BMI 37.02 kg/m    Physical Exam Constitutional:      Comments: Disheveled Appears sleepy.       ASSESSMENT & PLAN:   Bipolar disorder with severe depression (HCC) Assessment & Plan: Bipolar depression with current severe depression: Start a mood stabilizer called Lamictal 25 mg once daily in a.m. x 1 week, then increase to twice daily x 1 week, then increase to 2 twice daily. Discontinue Caplyta.  Placed on list of allergies/intolerances. Recommend decrease Paxil  CR to 25 mg daily for 1 week, then discontinue. Start on Trintellix 10 mg once daily at the same time as you start the Lamictal and decrease your Paxil  CR. HOLD TRAZODONE .  Recommend off work returning on Wednesday.  If needs to be out longer patient needs to call back for a note.   Orders: -     lamoTRIgine; Take 1 tablet (25 mg total) by mouth daily for 7 days, THEN 1 tablet (25 mg total) 2 (two) times daily for 7 days, THEN 2 tablets (50 mg total) 2 (two) times  daily for 16 days.  Dispense: 85 tablet; Refill: 0 -     Vortioxetine HBr; Take 1 tablet (10 mg total) by mouth daily.  Nausea Assessment & Plan: Prescription for Phenergan  sent for nausea. If  worsening abdominal pain, nausea, vomiting I would recommend patient get an in person appointment. Likely secondary to caplyta.   Orders: -     Promethazine  HCl; Take 1 tablet (25 mg total) by mouth every 8 (eight) hours as needed for nausea or vomiting.  Dispense: 30 tablet; Refill: 1  Epigastric abdominal pain Assessment & Plan: Prescription for Phenergan  sent for nausea. If worsening abdominal pain, nausea, vomiting I would recommend patient get an in person appointment. Likely secondary to caplyta.       No orders of the defined types were placed in this encounter.    Meds ordered this encounter  Medications   lamoTRIgine (LAMICTAL) 25 MG tablet    Sig: Take 1 tablet (25 mg total) by mouth daily for 7 days, THEN 1 tablet (25 mg total) 2 (two) times daily for 7 days, THEN 2 tablets (50 mg total) 2 (two) times daily for 16 days.    Dispense:  85 tablet    Refill:  0   promethazine  (PHENERGAN ) 25 MG tablet    Sig: Take 1 tablet (25 mg total) by mouth every 8 (eight) hours as needed for nausea or vomiting.    Dispense:  30 tablet    Refill:  1   vortioxetine HBr (TRINTELLIX) 10 MG TABS tablet    Sig: Take 1 tablet (10 mg total) by mouth daily.    Follow Up:  In Person in 3 week(s)  Signed, Abigail Free, MD  01/26/2024 11:05 AM    [facility] [place]

## 2024-01-27 ENCOUNTER — Other Ambulatory Visit: Payer: Self-pay | Admitting: Family Medicine

## 2024-01-27 MED ORDER — AMPHETAMINE-DEXTROAMPHET ER 30 MG PO CP24: 30.0000 mg | ORAL_CAPSULE | ORAL | 0 refills | Status: DC

## 2024-01-27 NOTE — Telephone Encounter (Signed)
 Copied from CRM [id number]. Topic: Clinical - Medication Refill >> Jan 27, 2024 11:51 AM Anna [name] wrote: Medication: amphetamine -dextroamphetamine  (ADDERALL XR) 30 MG 24 hr capsule   Has the patient contacted their pharmacy? Yes (Agent: If no, request that the patient contact the pharmacy for the refill. If patient does not wish to contact the pharmacy document the reason why and proceed with request.) (Agent: If yes, when and what did the pharmacy advise?)  This is the patient's preferred pharmacy:  [id number] - [place], [place] - [address] [facility] [place]  [address] Phone: [phone] Fax: [phone]   Is this the correct pharmacy for this prescription? Yes If no, delete pharmacy and type the correct one.   Has the prescription been filled recently? Yes  Is the patient out of the medication? No  Has the patient been seen for an appointment in the last year OR does the patient have an upcoming appointment? Yes  Can we respond through MyChart? Yes  Agent: Please be advised that Rx refills may take up to 3 business days. We ask that you follow-up with your pharmacy.

## 2024-02-02 ENCOUNTER — Telehealth: Payer: Self-pay

## 2024-02-02 NOTE — Telephone Encounter (Unsigned)
 Copied from CRM [id number]. Topic: Clinical - Medication Question >> Feb 02, 2024  9:55 AM Anna [name] wrote: Reason for CRM: Patient is requesting to change her depression medications(Trintellix ) back to Paxil  25mg  and Vraylar  1.5mg .    [id number] - [place], [place] - [address] [place] [id number] Phone: [phone] Fax: [phone] Hours: Not open 24 hours

## 2024-02-02 NOTE — Telephone Encounter (Signed)
 Restart paxil  cr at increased dose of 25 mg 2 daily. Notified. Dr. Sherre

## 2024-02-02 NOTE — Telephone Encounter (Signed)
 Called the patient and she stated that stopped the trintellix  on Saturday and only took the medication for a week but it gave her dry mouth so she stopped it. She is just wanting to go back to her paxil  she said.

## 2024-02-16 ENCOUNTER — Other Ambulatory Visit: Payer: Self-pay | Admitting: Family Medicine

## 2024-02-16 DIAGNOSIS — F3131 Bipolar disorder, current episode depressed, mild: Secondary | ICD-10-CM

## 2024-02-20 ENCOUNTER — Other Ambulatory Visit: Payer: Self-pay

## 2024-02-20 ENCOUNTER — Encounter: Payer: Self-pay | Admitting: Family Medicine

## 2024-02-20 ENCOUNTER — Ambulatory Visit: Admitting: Family Medicine

## 2024-02-20 VITALS — BP 110/80 | HR 86 | Temp 98.4°F | Ht 63.0 in | Wt 199.0 lb

## 2024-02-20 DIAGNOSIS — F332 Major depressive disorder, recurrent severe without psychotic features: Secondary | ICD-10-CM | POA: Insufficient documentation

## 2024-02-20 MED ORDER — BUPROPION HCL ER (XL) 150 MG PO TB24: 150.0000 mg | ORAL_TABLET | Freq: Every day | ORAL | 0 refills | Status: DC

## 2024-02-20 MED ORDER — AUVELITY 45-105 MG PO TBCR: EXTENDED_RELEASE_TABLET | ORAL | 0 refills | Status: DC

## 2024-02-20 MED ORDER — BUPROPION HCL ER (XL) 150 MG PO TB24: 150.0000 mg | ORAL_TABLET | Freq: Every day | ORAL | 0 refills | Status: AC

## 2024-02-20 NOTE — Patient Instructions (Addendum)
 Stop paxil  when you start auvelity.  Auvelity is one daily for 3 days x 3 days then increase to twice daily.  If adine is denied by [employer] [facility], start on wellbutrin xl daily in am.  Restart vraylar  1.5 mg once daily.  Dr. Sherre

## 2024-02-20 NOTE — Progress Notes (Unsigned)
 Subjective:  Patient ID: Anna [name], female    DOB: [birth year]  Age: 53 y.o. MRN: [id number]  Chief Complaint  Patient presents with   Medical Management of Chronic Issues    Depression f/u. Stopped medication after 3 days, caused drowsiness. Caplyta -drowsiness. Trintellix - dry mouth    HPI: Discussed the use of AI scribe software for clinical note transcription with the patient, who gave verbal consent to proceed.  History of Present Illness Anna [name] is a 53 year old female who presents with medication management issues related to her mood disorder.  Sedation and functional impairment - Significant sedation from current medication regimen, impacting ability to work. - Unable to work for ten days in November due to medication effects. - Excessive sleepiness after taking Paxil , even at reduced doses.] - Intolerant to Caplyta  for the same reason  - Weakness attributed to lack of exercise over past two months.  Mood symptoms - Mood is persistently 'down' with frequent weeping. - No suicidal thoughts.  Medication history and adverse effects - Current regimen includes Paxil  CR, previously at 50 mg, reduced to 25 mg due to poor tolerance and sedation. - Caplyta  recently started and discontinued due to sedation. - Lamictal  started and discontinued. - Trintellix  attempted, discontinued due to dry mouth. - Prozac previously used, discontinued due to side effects. - No prior use of Zoloft, Celexa, or Effexor. - Concern about weight gain associated with Paxil . - History of weight loss attributed to medication changes.  Attention deficit symptoms - Continues to take Adderall, which helps with attention. - Diminished efficacy of Adderall over time.  Constitutional and other symptoms - No fever, chills, sweats, or earaches.       02/20/2024   10:38 AM 01/20/2024    8:22 AM 01/14/2024   10:22 AM 10/09/2023    7:45 AM 07/09/2023    7:50 AM  Depression screen PHQ 2/9  Decreased  Interest 3 2 2  0 2  Down, Depressed, Hopeless 3 0 1 0 1  PHQ - 2 Score 6 2 3  0 3  Altered sleeping 3 2 3  0 3  Tired, decreased energy 3 3 3  0 0  Change in appetite 0 0 0 0 0  Feeling bad or failure about yourself  3 0 2 0 0  Trouble concentrating 3 1 2  0 0  Moving slowly or fidgety/restless 2 0 1 0 0  Suicidal thoughts 0 0 0 0 0  PHQ-9 Score 20 8 14   0  6   Difficult doing work/chores Somewhat difficult Somewhat difficult Very difficult Not difficult at all Not difficult at all     Data saved with a previous flowsheet row definition        10/09/2023    7:45 AM  Fall Risk   Falls in the past year? 0  Number falls in past yr: 0  Injury with Fall? 0   Risk for fall due to : No Fall Risks  Follow up Falls evaluation completed     Data saved with a previous flowsheet row definition    Patient Care Team: [name] Clapper, MD as PCP - General (Family Medicine)   Review of Systems  All other systems reviewed and are negative.   Medications Ordered Prior to Encounter[1] Past Medical History:  Diagnosis Date   ADD (attention deficit disorder)    Arthritis    Asthma    seasonal   Bipolar affective disorder, current episode depressed (HCC)    Diabetes (HCC) 07/10/2023  GERD (gastroesophageal reflux disease)    occ   History of recurrent UTIs 03/11/2012   Hypertension    Moderate persistent asthma with exacerbation 12/16/2021   Past Surgical History:  Procedure Laterality Date   ABDOMINAL HYSTERECTOMY  2010   endometriosis. Total hysterectomy   ANTERIOR CERVICAL DECOMP/DISCECTOMY FUSION N/A 07/13/2012   Procedure: ANTERIOR CERVICAL DECOMPRESSION/DISCECTOMY FUSION 1 LEVEL;  Surgeon: Arley SHAUNNA Helling, MD;  Location: [facility];  Service: Neurosurgery;  Laterality: N/A;  Anterior Cervical Decompression/Discectomy Cervical Three-Four   CARPAL TUNNEL RELEASE Right    CERVICAL DISC SURGERY  09   CHOLECYSTECTOMY      Family History  Problem Relation Age of Onset   Breast cancer Neg  Hx    Social History   Socioeconomic History   Marital status: Married    Spouse name: Not on file   Number of children: Not on file   Years of education: Not on file   Highest education level: Not on file  Occupational History   Not on file  Tobacco Use   Smoking status: Never   Smokeless tobacco: Never  Vaping Use   Vaping status: Never Used  Substance and Sexual Activity   Alcohol use: No   Drug use: No    Comment: 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   Sexual activity: Not on file  Other Topics Concern   Not on file  Social History Narrative   Not on file   Social Drivers of Health   Tobacco Use: Low Risk (02/20/2024)   Patient History    Smoking Tobacco Use: Never    Smokeless Tobacco Use: Never    Passive Exposure: Not on file  Financial Resource Strain: Low Risk (07/09/2023)   Overall Financial Resource Strain (CARDIA)    Difficulty of Paying Living Expenses: Not hard at all  Food Insecurity: No Food Insecurity (07/09/2023)   Hunger Vital Sign    Worried About Running Out of Food in the Last Year: Never true    Ran Out of Food in the Last Year: Never true  Transportation Needs: No Transportation Needs (07/09/2023)   PRAPARE - Administrator, Civil Service (Medical): No    Lack of Transportation (Non-Medical): No  Physical Activity: Inactive (07/09/2023)   Exercise Vital Sign    Days of Exercise per Week: 0 days    Minutes of Exercise per Session: 0 min  Stress: No Stress Concern Present (07/09/2023)   [employer] of Occupational Health - Occupational Stress Questionnaire    Feeling of Stress : Not at all  Social Connections: Moderately Integrated (07/09/2023)   Social Connection and Isolation Panel    Frequency of Communication with Friends and Family: More than three times a week    Frequency of Social Gatherings with Friends and Family: Three times a week    Attends Religious Services: More than 4 times per year    Active Member of Clubs or  Organizations: No    Attends Banker Meetings: Never    Marital Status: Married  Depression (PHQ2-9): High Risk (02/20/2024)   Depression (PHQ2-9)    PHQ-2 Score: 20  Alcohol Screen: Low Risk (07/09/2023)   Alcohol Screen    Last Alcohol Screening Score (AUDIT): 0  Housing: Low Risk (07/09/2023)   Housing Stability Vital Sign    Unable to Pay for Housing in the Last Year: No    Number of Times Moved in the Last Year: 0    Homeless in the Last  Year: No  Utilities: Not At Risk (07/09/2023)   AHC Utilities    Threatened with loss of utilities: No  Health Literacy: Adequate Health Literacy (07/09/2023)   B1300 Health Literacy    Frequency of need for help with medical instructions: Never    Objective:  BP 110/80   Pulse 86   Temp 98.4 F (36.9 C)   Ht 5' 3 (1.6 m)   Wt 199 lb (90.3 kg)   SpO2 99%   BMI 35.25 kg/m      02/20/2024   10:30 AM 01/26/2024   10:18 AM 01/20/2024    7:34 AM  BP/Weight  Systolic BP 110  867  Diastolic BP 80  82  Wt. (Lbs) 199 209 209.6  BMI 35.25 kg/m2 37.02 kg/m2 37.13 kg/m2    Physical Exam Vitals reviewed.  Constitutional:      Appearance: Normal appearance. She is obese.  Neck:     Vascular: No carotid bruit.  Cardiovascular:     Rate and Rhythm: Normal rate and regular rhythm.     Heart sounds: Normal heart sounds.  Pulmonary:     Effort: Pulmonary effort is normal. No respiratory distress.     Breath sounds: Normal breath sounds.  Abdominal:     General: Abdomen is flat. Bowel sounds are normal.     Palpations: Abdomen is soft.     Tenderness: There is no abdominal tenderness.  Neurological:     Mental Status: She is alert and oriented to person, place, and time.  Psychiatric:        Behavior: Behavior normal.     Comments: Depressed.          Lab Results  Component Value Date   WBC 8.9 01/14/2024   HGB 12.6 01/14/2024   HCT 38.9 01/14/2024   PLT 286 01/14/2024   GLUCOSE 83 01/14/2024   CHOL 118  10/09/2023   TRIG 91 10/09/2023   HDL 47 10/09/2023   LDLCALC 53 10/09/2023   ALT 15 01/14/2024   AST 21 01/14/2024   NA 136 01/14/2024   K 4.1 01/14/2024   CL 98 01/14/2024   CREATININE 0.98 01/14/2024   BUN 8 01/14/2024   CO2 23 01/14/2024   TSH 0.939 01/14/2024   HGBA1C 5.2 01/20/2024    Results for orders placed or performed in visit on 01/20/24  POCT Lipid Panel   Collection Time: 01/20/24  8:16 AM  Result Value Ref Range   TC 118    HDL 56    TRG 74    LDL 47    Non-HDL 61    TC/HDL    POCT glycosylated hemoglobin (Hb A1C)   Collection Time: 01/20/24  8:17 AM  Result Value Ref Range   Hemoglobin A1C     HbA1c POC (<> result, manual entry) 5.2 4.0 - 5.6 %   HbA1c, POC (prediabetic range)     HbA1c, POC (controlled diabetic range)    .  Assessment & Plan:   Assessment & Plan Bipolar disorder with severe depression (HCC) Stop paxil  when you start auvelity .  Start auvelity  titration and vraylar  1.5 mg once daily.   Orders:   Dextromethorphan-buPROPion  ER (AUVELITY ) 45-105 MG TBCR; Take 1 tablet by mouth daily for 3 days, THEN 1 tablet in the morning and at bedtime for 27 days.    Body mass index is 35.25 kg/m.    Meds ordered this encounter  Medications   Dextromethorphan-buPROPion  ER (AUVELITY ) 45-105 MG TBCR    Sig:  Take 1 tablet by mouth daily for 3 days, THEN 1 tablet in the morning and at bedtime for 27 days.    Dispense:  57 tablet    Refill:  0   DISCONTD: buPROPion  (WELLBUTRIN  XL) 150 MG 24 hr tablet    Sig: Take 1 tablet (150 mg total) by mouth daily.    Dispense:  30 tablet    Refill:  0   DISCONTD: buPROPion  (WELLBUTRIN  XL) 150 MG 24 hr tablet    Sig: Take 1 tablet (150 mg total) by mouth daily.    Dispense:  30 tablet    Refill:  0    No orders of the defined types were placed in this encounter.      Follow-up: Return in about 4 weeks (around 03/19/2024).  An After Visit Summary was printed and given to the patient.  Abigail Free,  MD [facility] [phone]     [1]  Current Outpatient Medications on File Prior to Visit  Medication Sig Dispense Refill   albuterol  (VENTOLIN  HFA) 108 (90 Base) MCG/ACT inhaler Inhale 2 puffs into the lungs every 6 (six) hours as needed for wheezing or shortness of breath. 18 g 1   ALPRAZolam  (XANAX ) 0.5 MG tablet TAKE 1 TABLET BY MOUTH DAILY AS NEEDED FOR SEVERE ANXIETY 30 tablet 2   amLODipine -olmesartan  (AZOR ) 10-40 MG tablet TAKE 1 TABLET BY MOUTH EVERY DAY 90 tablet 1   cyclobenzaprine  (FLEXERIL ) 5 MG tablet TAKE 1 TABLET BY MOUTH THREE TIMES A DAY AS NEEDED FOR MUSCLE SPASM 60 tablet 1   fluticasone  (FLONASE ) 50 MCG/ACT nasal spray Place 2 sprays into both nostrils daily. 18 mL 2   lamoTRIgine  (LAMICTAL ) 25 MG tablet Take 1 tablet (25 mg total) by mouth daily for 7 days, THEN 1 tablet (25 mg total) 2 (two) times daily for 7 days, THEN 2 tablets (50 mg total) 2 (two) times daily for 16 days. 85 tablet 0   omega-3 acid ethyl esters (LOVAZA ) 1 g capsule TAKE 1 CAPSULE BY MOUTH TWICE A DAY 180 capsule 0   PRESCRIPTION MEDICATION [facility] Biest 5/5 (estriol/estradiol)/Progesterone/Testosterone Dissolve 1/2 troche between upper gumline and cheek BID-do not chew, swallow, or place under tongue.     promethazine  (PHENERGAN ) 25 MG tablet Take 1 tablet (25 mg total) by mouth every 8 (eight) hours as needed for nausea or vomiting. 30 tablet 1   rosuvastatin  (CRESTOR ) 20 MG tablet TAKE 1 TABLET BY MOUTH EVERY DAY 90 tablet 1   tirzepatide  (MOUNJARO ) 7.5 MG/0.5ML Pen Inject 7.5 mg into the skin once a week. 2 mL 2   triamcinolone  cream (KENALOG ) 0.1 % Apply 1 application. topically 2 (two) times daily. (Patient taking differently: Apply 1 application  topically 2 (two) times daily. As needed) 45 g 1   Vitamin D , Ergocalciferol , (DRISDOL ) 1.25 MG (50000 UNIT) CAPS capsule Take 1 capsule (50,000 Units total) by mouth every 7 (seven) days. 24 capsule 0   No current  facility-administered medications on file prior to visit.

## 2024-02-23 ENCOUNTER — Other Ambulatory Visit: Payer: Self-pay

## 2024-02-23 ENCOUNTER — Telehealth: Payer: Self-pay

## 2024-02-23 NOTE — Telephone Encounter (Signed)
 PA for Auvelity  submitted and approved covermymeds.

## 2024-02-24 MED ORDER — AMPHETAMINE-DEXTROAMPHET ER 30 MG PO CP24: 30.0000 mg | ORAL_CAPSULE | ORAL | 0 refills | Status: DC

## 2024-02-26 NOTE — Assessment & Plan Note (Signed)
 Stop paxil  when you start auvelity .  Start auvelity  titration and vraylar  1.5 mg once daily.   Orders:   Dextromethorphan-buPROPion  ER (AUVELITY ) 45-105 MG TBCR; Take 1 tablet by mouth daily for 3 days, THEN 1 tablet in the morning and at bedtime for 27 days.

## 2024-03-01 ENCOUNTER — Other Ambulatory Visit: Payer: Self-pay | Admitting: Family Medicine

## 2024-03-17 ENCOUNTER — Other Ambulatory Visit: Payer: Self-pay | Admitting: Family Medicine

## 2024-03-17 DIAGNOSIS — E1169 Type 2 diabetes mellitus with other specified complication: Secondary | ICD-10-CM

## 2024-03-19 ENCOUNTER — Ambulatory Visit: Admitting: Family Medicine

## 2024-03-19 ENCOUNTER — Encounter: Payer: Self-pay | Admitting: Family Medicine

## 2024-03-19 VITALS — BP 110/64 | HR 66 | Temp 98.1°F | Ht 63.0 in | Wt 191.0 lb

## 2024-03-19 DIAGNOSIS — Z1211 Encounter for screening for malignant neoplasm of colon: Secondary | ICD-10-CM

## 2024-03-19 DIAGNOSIS — F314 Bipolar disorder, current episode depressed, severe, without psychotic features: Secondary | ICD-10-CM

## 2024-03-19 MED ORDER — CARIPRAZINE HCL 1.5 MG PO CAPS: 1.5000 mg | ORAL_CAPSULE | Freq: Every day | ORAL | 1 refills | Status: AC

## 2024-03-19 MED ORDER — AUVELITY 45-105 MG PO TBCR: 1.0000 | EXTENDED_RELEASE_TABLET | Freq: Two times a day (BID) | ORAL | 1 refills | Status: AC

## 2024-03-19 NOTE — Patient Instructions (Addendum)
" °  VISIT SUMMARY: Today we discussed your progress with your current depression treatment. You have shown significant improvement in mood with your current medications and have not experienced any manic episodes.  YOUR PLAN: BIPOLAR DISORDER, CURRENT EPISODE DEPRESSED, SEVERE: Your bipolar disorder is well-managed, and your depression has resolved with the current treatment. -Continue taking Auvelity , one tablet in the morning and one in the evening. -Continue taking Vraylar  1.5 mg daily. -Your 90-day prescriptions for Auvelity  and Vraylar  have been refilled.  GENERAL HEALTH MAINTENANCE: You are due for an eye exam. -Schedule an eye exam. -Continue with your routine health maintenance.  COLON CANCER SCREENING ORDERED. COLOGUARD.                     Contains text generated by Abridge.                                 Contains text generated by Abridge.   "

## 2024-03-19 NOTE — Progress Notes (Unsigned)
 "  Subjective:  Patient ID: Anna [name], female    DOB: October 24, 1970  Age: 54 y.o. MRN: [id number]  Chief Complaint  Patient presents with   Medical Management of Chronic Issues    HPI: Discussed the use of AI scribe software for clinical note transcription with the patient, who gave verbal consent to proceed.  History of Present Illness Anna [name] is a 54 year old female who presents for a follow-up regarding her depression treatment.  Mood symptoms - Significant improvement in mood since initiation of current medication regimen - No manic episodes since starting Vraylar   Psychotropic medication therapy - Currently taking Auvelity , one tablet twice daily, with positive response - Currently taking Vraylar  1.5 mg daily - Previously trialed Wellbutrin  without benefit; now experiencing improvement with current regimen  Constitutional and systemic symptoms - No fevers, chills, or sweats - No earaches, sore throat, or stuffy nose - No pain, chest pain, or breathing difficulties       03/19/2024   10:23 AM 02/20/2024   10:38 AM 01/20/2024    8:22 AM 01/14/2024   10:22 AM 10/09/2023    7:45 AM  Depression screen PHQ 2/9  Decreased Interest 0 3 2 2  0  Down, Depressed, Hopeless 0 3 0 1 0  PHQ - 2 Score 0 6 2 3  0  Altered sleeping 0 3 2 3  0  Tired, decreased energy 0 3 3 3  0  Change in appetite 0 0 0 0 0  Feeling bad or failure about yourself  0 3 0 2 0  Trouble concentrating 0 3 1 2  0  Moving slowly or fidgety/restless 0 2 0 1 0  Suicidal thoughts 0 0 0 0 0  PHQ-9 Score 0 20 8 14   0   Difficult doing work/chores Not difficult at all Somewhat difficult Somewhat difficult Very difficult Not difficult at all     Data saved with a previous flowsheet row definition        10/09/2023    7:45 AM  Fall Risk   Falls in the past year? 0  Number falls in past yr: 0  Injury with Fall? 0   Risk for fall due to : No Fall Risks  Follow up Falls evaluation completed     Data saved with  a previous flowsheet row definition    Patient Care Team: Uzair Godley, Abigail, MD as PCP - General (Family Medicine)   Review of Systems  Medications Ordered Prior to Encounter[1] Past Medical History:  Diagnosis Date   ADD (attention deficit disorder)    Arthritis    Asthma    seasonal   Bipolar affective disorder, current episode depressed (HCC)    Diabetes (HCC) 07/10/2023   GERD (gastroesophageal reflux disease)    occ   History of recurrent UTIs 03/11/2012   Hypertension    Moderate persistent asthma with exacerbation 12/16/2021   Past Surgical History:  Procedure Laterality Date   ABDOMINAL HYSTERECTOMY  2010   endometriosis. Total hysterectomy   ANTERIOR CERVICAL DECOMP/DISCECTOMY FUSION N/A 07/13/2012   Procedure: ANTERIOR CERVICAL DECOMPRESSION/DISCECTOMY FUSION 1 LEVEL;  Surgeon: Arley SHAUNNA Helling, MD;  Location: [facility];  Service: Neurosurgery;  Laterality: N/A;  Anterior Cervical Decompression/Discectomy Cervical Three-Four   CARPAL TUNNEL RELEASE Right    CERVICAL DISC SURGERY  09   CHOLECYSTECTOMY      Family History  Problem Relation Age of Onset   Breast cancer Neg Hx    Social History   Socioeconomic History  Marital status: Married    Spouse name: Not on file   Number of children: Not on file   Years of education: Not on file   Highest education level: Not on file  Occupational History   Not on file  Tobacco Use   Smoking status: Never   Smokeless tobacco: Never  Vaping Use   Vaping status: Never Used  Substance and Sexual Activity   Alcohol use: No   Drug use: No    Comment: 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   Sexual activity: Not on file  Other Topics Concern   Not on file  Social History Narrative   Not on file   Social Drivers of Health   Tobacco Use: Low Risk (03/19/2024)   Patient History    Smoking Tobacco Use: Never    Smokeless Tobacco Use: Never    Passive Exposure: Not on file  Financial Resource Strain: Low Risk (07/09/2023)    Overall Financial Resource Strain (CARDIA)    Difficulty of Paying Living Expenses: Not hard at all  Food Insecurity: No Food Insecurity (07/09/2023)   Hunger Vital Sign    Worried About Running Out of Food in the Last Year: Never true    Ran Out of Food in the Last Year: Never true  Transportation Needs: No Transportation Needs (07/09/2023)   PRAPARE - Administrator, Civil Service (Medical): No    Lack of Transportation (Non-Medical): No  Physical Activity: Inactive (07/09/2023)   Exercise Vital Sign    Days of Exercise per Week: 0 days    Minutes of Exercise per Session: 0 min  Stress: No Stress Concern Present (07/09/2023)   [employer] of Occupational [employer] - Occupational Stress Questionnaire    Feeling of Stress : Not at all  Social Connections: Moderately Integrated (07/09/2023)   Social Connection and Isolation Panel    Frequency of Communication with Friends and Family: More than three times a week    Frequency of Social Gatherings with Friends and Family: Three times a week    Attends Religious Services: More than 4 times per year    Active Member of Clubs or Organizations: No    Attends Banker Meetings: Never    Marital Status: Married  Depression (PHQ2-9): Low Risk (03/19/2024)   Depression (PHQ2-9)    PHQ-2 Score: 0  Recent Concern: Depression (PHQ2-9) - High Risk (02/20/2024)   Depression (PHQ2-9)    PHQ-2 Score: 20  Alcohol Screen: Low Risk (07/09/2023)   Alcohol Screen    Last Alcohol Screening Score (AUDIT): 0  Housing: Low Risk (07/09/2023)   Housing Stability Vital Sign    Unable to Pay for Housing in the Last Year: No    Number of Times Moved in the Last Year: 0    Homeless in the Last Year: No  Utilities: Not At Risk (07/09/2023)   AHC Utilities    Threatened with loss of utilities: No  Health Literacy: Adequate Health Literacy (07/09/2023)   B1300 Health Literacy    Frequency of need for help with medical instructions: Never     Objective:  BP 110/64   Pulse 66   Temp 98.1 F (36.7 C)   Ht 5' 3 (1.6 m)   Wt 191 lb (86.6 kg)   SpO2 99%   BMI 33.83 kg/m      03/19/2024   10:18 AM 02/20/2024   10:30 AM 01/26/2024   10:18 AM  BP/Weight  Systolic BP 110 110  Diastolic BP 64 80   Wt. (Lbs) 191 199 209  BMI 33.83 kg/m2 35.25 kg/m2 37.02 kg/m2    Physical Exam Vitals reviewed.  Constitutional:      Appearance: Normal appearance.  Cardiovascular:     Rate and Rhythm: Normal rate and regular rhythm.     Heart sounds: Normal heart sounds.  Pulmonary:     Effort: Pulmonary effort is normal.     Breath sounds: Normal breath sounds.  Neurological:     Mental Status: She is alert and oriented to person, place, and time.  Psychiatric:        Mood and Affect: Mood normal.        Behavior: Behavior normal.     {Perform Simple Foot Exam  Perform Detailed exam:1} {Insert foot Exam (Optional):30965}   Lab Results  Component Value Date   WBC 8.9 01/14/2024   HGB 12.6 01/14/2024   HCT 38.9 01/14/2024   PLT 286 01/14/2024   GLUCOSE 83 01/14/2024   CHOL 118 10/09/2023   TRIG 91 10/09/2023   HDL 47 10/09/2023   LDLCALC 53 10/09/2023   ALT 15 01/14/2024   AST 21 01/14/2024   NA 136 01/14/2024   K 4.1 01/14/2024   CL 98 01/14/2024   CREATININE 0.98 01/14/2024   BUN 8 01/14/2024   CO2 23 01/14/2024   TSH 0.939 01/14/2024   HGBA1C 5.2 01/20/2024    Results for orders placed or performed in visit on 01/20/24  POCT Lipid Panel   Collection Time: 01/20/24  8:16 AM  Result Value Ref Range   TC 118    HDL 56    TRG 74    LDL 47    Non-HDL 61    TC/HDL    POCT glycosylated hemoglobin (Hb A1C)   Collection Time: 01/20/24  8:17 AM  Result Value Ref Range   Hemoglobin A1C     HbA1c POC (<> result, manual entry) 5.2 4.0 - 5.6 %   HbA1c, POC (prediabetic range)     HbA1c, POC (controlled diabetic range)    .  Assessment & Plan:   Assessment & Plan Bipolar disorder with severe depression  (HCC) Bipolar disorder well-managed, depression resolved. Current treatment effective. - Continue Auvelity , one tablet in the morning and one in the evening. - Continue Vraylar  1.5 mg daily. - Refilled 90-day prescription for Auvelity  and Vraylar . Orders:   Dextromethorphan-buPROPion  ER (AUVELITY ) 45-105 MG TBCR; Take 1 tablet by mouth in the morning and at bedtime.  Screening for colon cancer  Orders:   Cologuard   Body mass index is 33.83 kg/m.   Meds ordered this encounter  Medications   Dextromethorphan-buPROPion  ER (AUVELITY ) 45-105 MG TBCR    Sig: Take 1 tablet by mouth in the morning and at bedtime.    Dispense:  180 tablet    Refill:  1   cariprazine  (VRAYLAR ) 1.5 MG capsule    Sig: Take 1 capsule (1.5 mg total) by mouth daily.    Dispense:  90 capsule    Refill:  1    Orders Placed This Encounter  Procedures   Cologuard     I,Marla I Leal-Borjas,acting as a scribe for Anna Free, MD.,have documented all relevant documentation on the behalf of Anna Free, MD,as directed by  Anna Free, MD while in the presence of Anna Free, MD.   Follow-up: Return in about 3 months (around 06/17/2024) for chronic follow up.  An After Visit Summary was printed and given to the patient.  Anna Free, MD [name] [phone]     [1]  Current Outpatient Medications on File Prior to Visit  Medication Sig Dispense Refill   albuterol  (VENTOLIN  HFA) 108 (90 Base) MCG/ACT inhaler Inhale 2 puffs into the lungs every 6 (six) hours as needed for wheezing or shortness of breath. 18 g 1   ALPRAZolam  (XANAX ) 0.5 MG tablet TAKE 1 TABLET BY MOUTH DAILY AS NEEDED FOR SEVERE ANXIETY 30 tablet 2   amLODipine -olmesartan  (AZOR ) 10-40 MG tablet TAKE 1 TABLET BY MOUTH EVERY DAY 90 tablet 1   amphetamine -dextroamphetamine  (ADDERALL XR) 30 MG 24 hr capsule Take 1 capsule (30 mg total) by mouth every morning. For ADHD ICD-10:  F90.2 30 capsule 0   buPROPion  (WELLBUTRIN  XL) 150 MG 24  hr tablet Take 1 tablet (150 mg total) by mouth daily. 30 tablet 0   cyclobenzaprine  (FLEXERIL ) 5 MG tablet TAKE 1 TABLET BY MOUTH THREE TIMES A DAY AS NEEDED FOR MUSCLE SPASM 60 tablet 1   fluticasone  (FLONASE ) 50 MCG/ACT nasal spray Place 2 sprays into both nostrils daily. 18 mL 2   omega-3 acid ethyl esters (LOVAZA ) 1 g capsule TAKE 1 CAPSULE BY MOUTH TWICE A DAY 180 capsule 0   PRESCRIPTION MEDICATION [facility] Biest 5/5 (estriol/estradiol)/Progesterone/Testosterone Dissolve 1/2 troche between upper gumline and cheek BID-do not chew, swallow, or place under tongue.     promethazine  (PHENERGAN ) 25 MG tablet Take 1 tablet (25 mg total) by mouth every 8 (eight) hours as needed for nausea or vomiting. 30 tablet 1   rosuvastatin  (CRESTOR ) 20 MG tablet TAKE 1 TABLET BY MOUTH EVERY DAY 90 tablet 1   tirzepatide  (MOUNJARO ) 7.5 MG/0.5ML Pen Inject 7.5 mg into the skin once a week. 2 mL 2   triamcinolone  cream (KENALOG ) 0.1 % Apply 1 application. topically 2 (two) times daily. (Patient taking differently: Apply 1 application  topically 2 (two) times daily. As needed) 45 g 1   Vitamin D , Ergocalciferol , (DRISDOL ) 1.25 MG (50000 UNIT) CAPS capsule TAKE 1 CAPSULE (50,000 UNITS TOTAL) BY MOUTH EVERY 7 (SEVEN) DAYS 12 capsule 1   No current facility-administered medications on file prior to visit.   "

## 2024-03-19 NOTE — Assessment & Plan Note (Signed)
 Bipolar disorder well-managed, depression resolved. Current treatment effective. - Continue Auvelity , one tablet in the morning and one in the evening. - Continue Vraylar  1.5 mg daily. - Refilled 90-day prescription for Auvelity  and Vraylar . Orders:   Dextromethorphan-buPROPion  ER (AUVELITY ) 45-105 MG TBCR; Take 1 tablet by mouth in the morning and at bedtime.

## 2024-03-23 ENCOUNTER — Other Ambulatory Visit: Payer: Self-pay | Admitting: Family Medicine

## 2024-03-23 NOTE — Telephone Encounter (Unsigned)
 Copied from CRM #[id number]. Topic: Clinical - Medication Refill >> Mar 23, 2024  4:41 PM Gattis SQUIBB wrote: Medication: Adderall ER 30 mg  Has the patient contacted their pharmacy? No (Agent: If no, request that the patient contact the pharmacy for the refill. If patient does not wish to contact the pharmacy document the reason why and proceed with request.) (Agent: If yes, when and what did the pharmacy advise?)  This is the patient's preferred pharmacy:  [id number] - [place], [place] - [address] [facility] [place] [address] Phone: [phone] Fax: [phone]   Is this the correct pharmacy for this prescription? Yes If no, delete pharmacy and type the correct one.   Has the prescription been filled recently? Yes  Is the patient out of the medication? No  Has the patient been seen for an appointment in the last year OR does the patient have an upcoming appointment? Yes  Can we respond through MyChart? Yes  Agent: Please be advised that Rx refills may take up to 3 business days. We ask that you follow-up with your pharmacy.

## 2024-03-24 MED ORDER — AMPHETAMINE-DEXTROAMPHET ER 30 MG PO CP24: 30.0000 mg | ORAL_CAPSULE | ORAL | 0 refills | Status: AC

## 2024-04-06 ENCOUNTER — Other Ambulatory Visit: Payer: Self-pay | Admitting: Family Medicine

## 2024-04-06 DIAGNOSIS — E1169 Type 2 diabetes mellitus with other specified complication: Secondary | ICD-10-CM

## 2024-06-22 ENCOUNTER — Ambulatory Visit: Admitting: Family Medicine
